# Patient Record
Sex: Male | Born: 1950 | Race: White | Hispanic: No | Marital: Married | Smoking: Never smoker
Health system: Southern US, Community
[De-identification: ages and names within clinical notes are randomized; demographics above are authoritative.]

## PROBLEM LIST (undated history)

## (undated) DIAGNOSIS — R059 Cough, unspecified: Secondary | ICD-10-CM

## (undated) DIAGNOSIS — R05 Cough: Secondary | ICD-10-CM

## (undated) DIAGNOSIS — I428 Other cardiomyopathies: Secondary | ICD-10-CM

## (undated) DIAGNOSIS — G4733 Obstructive sleep apnea (adult) (pediatric): Secondary | ICD-10-CM

## (undated) DIAGNOSIS — I4891 Unspecified atrial fibrillation: Secondary | ICD-10-CM

## (undated) HISTORY — PX: FOOT FRACTURE SURGERY: SHX645

## (undated) HISTORY — DX: Cough, unspecified: R05.9

## (undated) HISTORY — DX: Obstructive sleep apnea (adult) (pediatric): G47.33

## (undated) HISTORY — DX: Unspecified atrial fibrillation: I48.91

## (undated) HISTORY — PX: PILONIDAL CYST EXCISION: SHX744

## (undated) HISTORY — DX: Other cardiomyopathies: I42.8

## (undated) HISTORY — DX: Cough: R05

---

## 2004-10-18 ENCOUNTER — Emergency Department (HOSPITAL_COMMUNITY): Admission: EM | Admit: 2004-10-18 | Discharge: 2004-10-18 | Payer: Self-pay | Admitting: Emergency Medicine

## 2010-04-16 ENCOUNTER — Inpatient Hospital Stay (HOSPITAL_COMMUNITY)
Admission: EM | Admit: 2010-04-16 | Discharge: 2010-05-10 | Disposition: A | Discharge status: Rehabilitation Facility | Payer: Self-pay | Source: Home / Self Care | Attending: Pulmonary Disease | Admitting: Pulmonary Disease

## 2010-04-18 ENCOUNTER — Encounter (INDEPENDENT_AMBULATORY_CARE_PROVIDER_SITE_OTHER): Payer: Self-pay | Admitting: Pulmonary Disease

## 2010-04-23 LAB — URINALYSIS, ROUTINE W REFLEX MICROSCOPIC
Bilirubin Urine: NEGATIVE
Ketones, ur: NEGATIVE mg/dL
Leukocytes, UA: NEGATIVE
Nitrite: NEGATIVE
Protein, ur: NEGATIVE mg/dL
Specific Gravity, Urine: 1.01 (ref 1.005–1.030)
Urine Glucose, Fasting: NEGATIVE mg/dL
Urobilinogen, UA: 0.2 mg/dL (ref 0.0–1.0)
pH: 7 (ref 5.0–8.0)

## 2010-04-23 LAB — COMPREHENSIVE METABOLIC PANEL
ALT: 548 U/L — ABNORMAL HIGH (ref 0–53)
AST: 113 U/L — ABNORMAL HIGH (ref 0–37)
Albumin: 2.8 g/dL — ABNORMAL LOW (ref 3.5–5.2)
Alkaline Phosphatase: 91 U/L (ref 39–117)
BUN: 45 mg/dL — ABNORMAL HIGH (ref 6–23)
CO2: 30 mEq/L (ref 19–32)
Calcium: 8.9 mg/dL (ref 8.4–10.5)
Chloride: 100 mEq/L (ref 96–112)
Creatinine, Ser: 2.75 mg/dL — ABNORMAL HIGH (ref 0.4–1.5)
GFR calc Af Amer: 29 mL/min — ABNORMAL LOW (ref >60)
GFR calc non Af Amer: 24 mL/min — ABNORMAL LOW (ref >60)
Glucose, Bld: 164 mg/dL — ABNORMAL HIGH (ref 70–99)
Potassium: 3.4 mEq/L — ABNORMAL LOW (ref 3.5–5.1)
Sodium: 142 mEq/L (ref 135–145)
Total Bilirubin: 4.3 mg/dL — ABNORMAL HIGH (ref 0.3–1.2)
Total Protein: 6.4 g/dL (ref 6.0–8.3)

## 2010-04-23 LAB — URINE MICROSCOPIC-ADD ON

## 2010-04-23 LAB — BASIC METABOLIC PANEL
BUN: 52 mg/dL — ABNORMAL HIGH (ref 6–23)
CO2: 32 mEq/L (ref 19–32)
Calcium: 9.2 mg/dL (ref 8.4–10.5)
Chloride: 99 mEq/L (ref 96–112)
Creatinine, Ser: 3.03 mg/dL — ABNORMAL HIGH (ref 0.4–1.5)
GFR calc Af Amer: 26 mL/min — ABNORMAL LOW (ref >60)
GFR calc non Af Amer: 21 mL/min — ABNORMAL LOW (ref >60)
Glucose, Bld: 159 mg/dL — ABNORMAL HIGH (ref 70–99)
Potassium: 3.6 mEq/L (ref 3.5–5.1)
Sodium: 143 mEq/L (ref 135–145)

## 2010-04-23 LAB — MAGNESIUM: Magnesium: 2.5 mg/dL (ref 1.5–2.5)

## 2010-04-23 LAB — PHOSPHORUS: Phosphorus: 5.1 mg/dL — ABNORMAL HIGH (ref 2.3–4.6)

## 2010-04-24 LAB — COMPREHENSIVE METABOLIC PANEL
ALT: 381 U/L — ABNORMAL HIGH (ref 0–53)
AST: 77 U/L — ABNORMAL HIGH (ref 0–37)
Albumin: 3 g/dL — ABNORMAL LOW (ref 3.5–5.2)
Alkaline Phosphatase: 123 U/L — ABNORMAL HIGH (ref 39–117)
BUN: 65 mg/dL — ABNORMAL HIGH (ref 6–23)
CO2: 33 mEq/L — ABNORMAL HIGH (ref 19–32)
Calcium: 9.3 mg/dL (ref 8.4–10.5)
Chloride: 100 mEq/L (ref 96–112)
Creatinine, Ser: 3.51 mg/dL — ABNORMAL HIGH (ref 0.4–1.5)
GFR calc Af Amer: 22 mL/min — ABNORMAL LOW (ref >60)
GFR calc non Af Amer: 18 mL/min — ABNORMAL LOW (ref >60)
Glucose, Bld: 148 mg/dL — ABNORMAL HIGH (ref 70–99)
Potassium: 3.2 mEq/L — ABNORMAL LOW (ref 3.5–5.1)
Sodium: 146 mEq/L — ABNORMAL HIGH (ref 135–145)
Total Bilirubin: 3.7 mg/dL — ABNORMAL HIGH (ref 0.3–1.2)
Total Protein: 7.1 g/dL (ref 6.0–8.3)

## 2010-04-24 LAB — DIGOXIN LEVEL: Digitoxin Lvl: 0.6 ng/mL — ABNORMAL LOW (ref 0.8–2.0)

## 2010-04-24 LAB — MAGNESIUM: Magnesium: 2.5 mg/dL (ref 1.5–2.5)

## 2010-04-25 LAB — COMPREHENSIVE METABOLIC PANEL
ALT: 256 U/L — ABNORMAL HIGH (ref 0–53)
AST: 61 U/L — ABNORMAL HIGH (ref 0–37)
Albumin: 3 g/dL — ABNORMAL LOW (ref 3.5–5.2)
Alkaline Phosphatase: 150 U/L — ABNORMAL HIGH (ref 39–117)
BUN: 89 mg/dL — ABNORMAL HIGH (ref 6–23)
CO2: 30 mEq/L (ref 19–32)
Calcium: 9.1 mg/dL (ref 8.4–10.5)
Chloride: 104 mEq/L (ref 96–112)
Creatinine, Ser: 3.46 mg/dL — ABNORMAL HIGH (ref 0.4–1.5)
GFR calc Af Amer: 22 mL/min — ABNORMAL LOW (ref >60)
GFR calc non Af Amer: 18 mL/min — ABNORMAL LOW (ref >60)
Glucose, Bld: 151 mg/dL — ABNORMAL HIGH (ref 70–99)
Potassium: 3.4 mEq/L — ABNORMAL LOW (ref 3.5–5.1)
Sodium: 148 mEq/L — ABNORMAL HIGH (ref 135–145)
Total Bilirubin: 2.4 mg/dL — ABNORMAL HIGH (ref 0.3–1.2)
Total Protein: 6.8 g/dL (ref 6.0–8.3)

## 2010-04-25 LAB — PHOSPHORUS: Phosphorus: 5.8 mg/dL — ABNORMAL HIGH (ref 2.3–4.6)

## 2010-04-25 LAB — CBC
HCT: 42.5 % (ref 39.0–52.0)
Hemoglobin: 13.5 g/dL (ref 13.0–17.0)
MCH: 30.4 pg (ref 26.0–34.0)
MCHC: 31.8 g/dL (ref 30.0–36.0)
MCV: 95.7 fL (ref 78.0–100.0)
Platelets: 174 10*3/uL (ref 150–400)
RBC: 4.44 MIL/uL (ref 4.22–5.81)
RDW: 15.5 % (ref 11.5–15.5)
WBC: 11.5 10*3/uL — ABNORMAL HIGH (ref 4.0–10.5)

## 2010-04-25 LAB — APTT: aPTT: 30 seconds (ref 24–37)

## 2010-04-25 LAB — PROTIME-INR
INR: 1.27 (ref 0.00–1.49)
Prothrombin Time: 16.1 seconds — ABNORMAL HIGH (ref 11.6–15.2)

## 2010-05-05 LAB — COMPREHENSIVE METABOLIC PANEL
ALT: 226 U/L — ABNORMAL HIGH (ref 0–53)
ALT: 200 U/L — ABNORMAL HIGH (ref 0–53)
ALT: 126 U/L — ABNORMAL HIGH (ref 0–53)
ALT: 96 U/L — ABNORMAL HIGH (ref 0–53)
ALT: 72 U/L — ABNORMAL HIGH (ref 0–53)
AST: 107 U/L — ABNORMAL HIGH (ref 0–37)
AST: 107 U/L — ABNORMAL HIGH (ref 0–37)
AST: 51 U/L — ABNORMAL HIGH (ref 0–37)
AST: 39 U/L — ABNORMAL HIGH (ref 0–37)
AST: 42 U/L — ABNORMAL HIGH (ref 0–37)
Albumin: 2.8 g/dL — ABNORMAL LOW (ref 3.5–5.2)
Albumin: 2.6 g/dL — ABNORMAL LOW (ref 3.5–5.2)
Albumin: 2.6 g/dL — ABNORMAL LOW (ref 3.5–5.2)
Albumin: 2.7 g/dL — ABNORMAL LOW (ref 3.5–5.2)
Albumin: 2.5 g/dL — ABNORMAL LOW (ref 3.5–5.2)
Alkaline Phosphatase: 245 U/L — ABNORMAL HIGH (ref 39–117)
Alkaline Phosphatase: 264 U/L — ABNORMAL HIGH (ref 39–117)
Alkaline Phosphatase: 225 U/L — ABNORMAL HIGH (ref 39–117)
Alkaline Phosphatase: 199 U/L — ABNORMAL HIGH (ref 39–117)
Alkaline Phosphatase: 175 U/L — ABNORMAL HIGH (ref 39–117)
BUN: 86 mg/dL — ABNORMAL HIGH (ref 6–23)
BUN: 82 mg/dL — ABNORMAL HIGH (ref 6–23)
BUN: 87 mg/dL — ABNORMAL HIGH (ref 6–23)
BUN: 88 mg/dL — ABNORMAL HIGH (ref 6–23)
BUN: 55 mg/dL — ABNORMAL HIGH (ref 6–23)
CO2: 30 mEq/L (ref 19–32)
CO2: 30 mEq/L (ref 19–32)
CO2: 32 mEq/L (ref 19–32)
CO2: 33 mEq/L — ABNORMAL HIGH (ref 19–32)
CO2: 25 mEq/L (ref 19–32)
Calcium: 9.2 mg/dL (ref 8.4–10.5)
Calcium: 9 mg/dL (ref 8.4–10.5)
Calcium: 9 mg/dL (ref 8.4–10.5)
Calcium: 8.9 mg/dL (ref 8.4–10.5)
Calcium: 8.4 mg/dL (ref 8.4–10.5)
Chloride: 110 mEq/L (ref 96–112)
Chloride: 117 mEq/L — ABNORMAL HIGH (ref 96–112)
Chloride: 107 mEq/L (ref 96–112)
Chloride: 101 mEq/L (ref 96–112)
Chloride: 102 mEq/L (ref 96–112)
Creatinine, Ser: 3.08 mg/dL — ABNORMAL HIGH (ref 0.4–1.5)
Creatinine, Ser: 2.57 mg/dL — ABNORMAL HIGH (ref 0.4–1.5)
Creatinine, Ser: 2.53 mg/dL — ABNORMAL HIGH (ref 0.4–1.5)
Creatinine, Ser: 2.58 mg/dL — ABNORMAL HIGH (ref 0.4–1.5)
Creatinine, Ser: 2.64 mg/dL — ABNORMAL HIGH (ref 0.4–1.5)
GFR calc Af Amer: 25 mL/min — ABNORMAL LOW (ref >60)
GFR calc Af Amer: 31 mL/min — ABNORMAL LOW (ref >60)
GFR calc Af Amer: 32 mL/min — ABNORMAL LOW (ref >60)
GFR calc Af Amer: 31 mL/min — ABNORMAL LOW (ref >60)
GFR calc Af Amer: 30 mL/min — ABNORMAL LOW (ref >60)
GFR calc non Af Amer: 21 mL/min — ABNORMAL LOW (ref >60)
GFR calc non Af Amer: 26 mL/min — ABNORMAL LOW (ref >60)
GFR calc non Af Amer: 26 mL/min — ABNORMAL LOW (ref >60)
GFR calc non Af Amer: 26 mL/min — ABNORMAL LOW (ref >60)
GFR calc non Af Amer: 25 mL/min — ABNORMAL LOW (ref >60)
Glucose, Bld: 179 mg/dL — ABNORMAL HIGH (ref 70–99)
Glucose, Bld: 162 mg/dL — ABNORMAL HIGH (ref 70–99)
Glucose, Bld: 156 mg/dL — ABNORMAL HIGH (ref 70–99)
Glucose, Bld: 108 mg/dL — ABNORMAL HIGH (ref 70–99)
Glucose, Bld: 94 mg/dL (ref 70–99)
Potassium: 3.4 mEq/L — ABNORMAL LOW (ref 3.5–5.1)
Potassium: 3.2 mEq/L — ABNORMAL LOW (ref 3.5–5.1)
Potassium: 3.2 mEq/L — ABNORMAL LOW (ref 3.5–5.1)
Potassium: 3.6 mEq/L (ref 3.5–5.1)
Potassium: 3.7 mEq/L (ref 3.5–5.1)
Sodium: 152 mEq/L — ABNORMAL HIGH (ref 135–145)
Sodium: 156 mEq/L — ABNORMAL HIGH (ref 135–145)
Sodium: 152 mEq/L — ABNORMAL HIGH (ref 135–145)
Sodium: 148 mEq/L — ABNORMAL HIGH (ref 135–145)
Sodium: 136 mEq/L (ref 135–145)
Total Bilirubin: 2.5 mg/dL — ABNORMAL HIGH (ref 0.3–1.2)
Total Bilirubin: 2 mg/dL — ABNORMAL HIGH (ref 0.3–1.2)
Total Bilirubin: 1.6 mg/dL — ABNORMAL HIGH (ref 0.3–1.2)
Total Bilirubin: 1 mg/dL (ref 0.3–1.2)
Total Bilirubin: 1.1 mg/dL (ref 0.3–1.2)
Total Protein: 6.8 g/dL (ref 6.0–8.3)
Total Protein: 6.6 g/dL (ref 6.0–8.3)
Total Protein: 6.6 g/dL (ref 6.0–8.3)
Total Protein: 6.8 g/dL (ref 6.0–8.3)
Total Protein: 6.4 g/dL (ref 6.0–8.3)

## 2010-05-05 LAB — GLUCOSE, CAPILLARY
Glucose-Capillary: 157 mg/dL — ABNORMAL HIGH (ref 70–99)
Glucose-Capillary: 178 mg/dL — ABNORMAL HIGH (ref 70–99)
Glucose-Capillary: 190 mg/dL — ABNORMAL HIGH (ref 70–99)
Glucose-Capillary: 190 mg/dL — ABNORMAL HIGH (ref 70–99)
Glucose-Capillary: 110 mg/dL — ABNORMAL HIGH (ref 70–99)
Glucose-Capillary: 142 mg/dL — ABNORMAL HIGH (ref 70–99)
Glucose-Capillary: 165 mg/dL — ABNORMAL HIGH (ref 70–99)
Glucose-Capillary: 165 mg/dL — ABNORMAL HIGH (ref 70–99)
Glucose-Capillary: 179 mg/dL — ABNORMAL HIGH (ref 70–99)
Glucose-Capillary: 189 mg/dL — ABNORMAL HIGH (ref 70–99)
Glucose-Capillary: 118 mg/dL — ABNORMAL HIGH (ref 70–99)
Glucose-Capillary: 124 mg/dL — ABNORMAL HIGH (ref 70–99)
Glucose-Capillary: 135 mg/dL — ABNORMAL HIGH (ref 70–99)
Glucose-Capillary: 146 mg/dL — ABNORMAL HIGH (ref 70–99)
Glucose-Capillary: 161 mg/dL — ABNORMAL HIGH (ref 70–99)
Glucose-Capillary: 167 mg/dL — ABNORMAL HIGH (ref 70–99)
Glucose-Capillary: 168 mg/dL — ABNORMAL HIGH (ref 70–99)
Glucose-Capillary: 172 mg/dL — ABNORMAL HIGH (ref 70–99)
Glucose-Capillary: 182 mg/dL — ABNORMAL HIGH (ref 70–99)
Glucose-Capillary: 95 mg/dL (ref 70–99)
Glucose-Capillary: 96 mg/dL (ref 70–99)
Glucose-Capillary: 103 mg/dL — ABNORMAL HIGH (ref 70–99)
Glucose-Capillary: 105 mg/dL — ABNORMAL HIGH (ref 70–99)
Glucose-Capillary: 107 mg/dL — ABNORMAL HIGH (ref 70–99)
Glucose-Capillary: 110 mg/dL — ABNORMAL HIGH (ref 70–99)
Glucose-Capillary: 112 mg/dL — ABNORMAL HIGH (ref 70–99)
Glucose-Capillary: 113 mg/dL — ABNORMAL HIGH (ref 70–99)
Glucose-Capillary: 120 mg/dL — ABNORMAL HIGH (ref 70–99)
Glucose-Capillary: 120 mg/dL — ABNORMAL HIGH (ref 70–99)
Glucose-Capillary: 123 mg/dL — ABNORMAL HIGH (ref 70–99)
Glucose-Capillary: 170 mg/dL — ABNORMAL HIGH (ref 70–99)
Glucose-Capillary: 192 mg/dL — ABNORMAL HIGH (ref 70–99)
Glucose-Capillary: 98 mg/dL (ref 70–99)
Glucose-Capillary: 108 mg/dL — ABNORMAL HIGH (ref 70–99)
Glucose-Capillary: 117 mg/dL — ABNORMAL HIGH (ref 70–99)
Glucose-Capillary: 123 mg/dL — ABNORMAL HIGH (ref 70–99)
Glucose-Capillary: 127 mg/dL — ABNORMAL HIGH (ref 70–99)
Glucose-Capillary: 142 mg/dL — ABNORMAL HIGH (ref 70–99)
Glucose-Capillary: 143 mg/dL — ABNORMAL HIGH (ref 70–99)
Glucose-Capillary: 143 mg/dL — ABNORMAL HIGH (ref 70–99)
Glucose-Capillary: 179 mg/dL — ABNORMAL HIGH (ref 70–99)
Glucose-Capillary: 113 mg/dL — ABNORMAL HIGH (ref 70–99)
Glucose-Capillary: 114 mg/dL — ABNORMAL HIGH (ref 70–99)
Glucose-Capillary: 114 mg/dL — ABNORMAL HIGH (ref 70–99)
Glucose-Capillary: 122 mg/dL — ABNORMAL HIGH (ref 70–99)
Glucose-Capillary: 123 mg/dL — ABNORMAL HIGH (ref 70–99)
Glucose-Capillary: 143 mg/dL — ABNORMAL HIGH (ref 70–99)

## 2010-05-05 LAB — BASIC METABOLIC PANEL
BUN: 83 mg/dL — ABNORMAL HIGH (ref 6–23)
BUN: 86 mg/dL — ABNORMAL HIGH (ref 6–23)
BUN: 81 mg/dL — ABNORMAL HIGH (ref 6–23)
BUN: 67 mg/dL — ABNORMAL HIGH (ref 6–23)
BUN: 55 mg/dL — ABNORMAL HIGH (ref 6–23)
CO2: 28 mEq/L (ref 19–32)
CO2: 30 mEq/L (ref 19–32)
CO2: 30 mEq/L (ref 19–32)
CO2: 28 mEq/L (ref 19–32)
CO2: 26 mEq/L (ref 19–32)
Calcium: 8.7 mg/dL (ref 8.4–10.5)
Calcium: 8.9 mg/dL (ref 8.4–10.5)
Calcium: 8.6 mg/dL (ref 8.4–10.5)
Calcium: 8.6 mg/dL (ref 8.4–10.5)
Calcium: 8.5 mg/dL (ref 8.4–10.5)
Chloride: 119 mEq/L — ABNORMAL HIGH (ref 96–112)
Chloride: 115 mEq/L — ABNORMAL HIGH (ref 96–112)
Chloride: 98 mEq/L (ref 96–112)
Chloride: 99 mEq/L (ref 96–112)
Chloride: 99 mEq/L (ref 96–112)
Creatinine, Ser: 2.59 mg/dL — ABNORMAL HIGH (ref 0.4–1.5)
Creatinine, Ser: 2.6 mg/dL — ABNORMAL HIGH (ref 0.4–1.5)
Creatinine, Ser: 2.51 mg/dL — ABNORMAL HIGH (ref 0.4–1.5)
Creatinine, Ser: 2.59 mg/dL — ABNORMAL HIGH (ref 0.4–1.5)
Creatinine, Ser: 2.45 mg/dL — ABNORMAL HIGH (ref 0.4–1.5)
GFR calc Af Amer: 31 mL/min — ABNORMAL LOW (ref >60)
GFR calc Af Amer: 31 mL/min — ABNORMAL LOW (ref >60)
GFR calc Af Amer: 32 mL/min — ABNORMAL LOW (ref >60)
GFR calc Af Amer: 31 mL/min — ABNORMAL LOW (ref >60)
GFR calc Af Amer: 33 mL/min — ABNORMAL LOW (ref >60)
GFR calc non Af Amer: 26 mL/min — ABNORMAL LOW (ref >60)
GFR calc non Af Amer: 25 mL/min — ABNORMAL LOW (ref >60)
GFR calc non Af Amer: 26 mL/min — ABNORMAL LOW (ref >60)
GFR calc non Af Amer: 26 mL/min — ABNORMAL LOW (ref >60)
GFR calc non Af Amer: 27 mL/min — ABNORMAL LOW (ref >60)
Glucose, Bld: 157 mg/dL — ABNORMAL HIGH (ref 70–99)
Glucose, Bld: 161 mg/dL — ABNORMAL HIGH (ref 70–99)
Glucose, Bld: 98 mg/dL (ref 70–99)
Glucose, Bld: 124 mg/dL — ABNORMAL HIGH (ref 70–99)
Glucose, Bld: 106 mg/dL — ABNORMAL HIGH (ref 70–99)
Potassium: 3.6 mEq/L (ref 3.5–5.1)
Potassium: 3 mEq/L — ABNORMAL LOW (ref 3.5–5.1)
Potassium: 3.1 mEq/L — ABNORMAL LOW (ref 3.5–5.1)
Potassium: 4 mEq/L (ref 3.5–5.1)
Potassium: 3.5 mEq/L (ref 3.5–5.1)
Sodium: 155 mEq/L — ABNORMAL HIGH (ref 135–145)
Sodium: 155 mEq/L — ABNORMAL HIGH (ref 135–145)
Sodium: 139 mEq/L (ref 135–145)
Sodium: 139 mEq/L (ref 135–145)
Sodium: 137 mEq/L (ref 135–145)

## 2010-05-05 LAB — BRAIN NATRIURETIC PEPTIDE
Pro B Natriuretic peptide (BNP): 1822 pg/mL — ABNORMAL HIGH (ref 0.0–100.0)
Pro B Natriuretic peptide (BNP): 1611 pg/mL — ABNORMAL HIGH (ref 0.0–100.0)
Pro B Natriuretic peptide (BNP): 625 pg/mL — ABNORMAL HIGH (ref 0.0–100.0)
Pro B Natriuretic peptide (BNP): 874 pg/mL — ABNORMAL HIGH (ref 0.0–100.0)

## 2010-05-05 LAB — CARBOXYHEMOGLOBIN
Carboxyhemoglobin: 1.4 % (ref 0.5–1.5)
Carboxyhemoglobin: 1.7 % — ABNORMAL HIGH (ref 0.5–1.5)
Carboxyhemoglobin: 1.6 % — ABNORMAL HIGH (ref 0.5–1.5)
Carboxyhemoglobin: 1.6 % — ABNORMAL HIGH (ref 0.5–1.5)
Methemoglobin: 0.5 % (ref 0.0–1.5)
Methemoglobin: 0.6 % (ref 0.0–1.5)
Methemoglobin: 0.4 % (ref 0.0–1.5)
Methemoglobin: 0.6 % (ref 0.0–1.5)
O2 Saturation: 67.6 %
O2 Saturation: 56.5 %
O2 Saturation: 95 %
O2 Saturation: 63.6 %
Total hemoglobin: 12.8 g/dL — ABNORMAL LOW (ref 13.5–18.0)
Total hemoglobin: 12.5 g/dL — ABNORMAL LOW (ref 13.5–18.0)
Total hemoglobin: 12.6 g/dL — ABNORMAL LOW (ref 13.5–18.0)
Total hemoglobin: 12.2 g/dL — ABNORMAL LOW (ref 13.5–18.0)
Total oxygen content: 62.2 mL/dL — ABNORMAL HIGH (ref 15.0–23.0)

## 2010-05-05 LAB — POCT I-STAT 3, ART BLOOD GAS (G3+)
Acid-Base Excess: 9 mmol/L — ABNORMAL HIGH (ref 0.0–2.0)
Bicarbonate: 34 mEq/L — ABNORMAL HIGH (ref 20.0–24.0)
O2 Saturation: 99 %
Patient temperature: 98.7
TCO2: 35 mmol/L (ref 0–100)
pCO2 arterial: 46.8 mmHg — ABNORMAL HIGH (ref 35.0–45.0)
pH, Arterial: 7.469 — ABNORMAL HIGH (ref 7.350–7.450)
pO2, Arterial: 154 mmHg — ABNORMAL HIGH (ref 80.0–100.0)

## 2010-05-05 LAB — CBC
HCT: 41 % (ref 39.0–52.0)
HCT: 40.9 % (ref 39.0–52.0)
HCT: 39.9 % (ref 39.0–52.0)
HCT: 38.3 % — ABNORMAL LOW (ref 39.0–52.0)
HCT: 39 % (ref 39.0–52.0)
HCT: 37.5 % — ABNORMAL LOW (ref 39.0–52.0)
HCT: 35.3 % — ABNORMAL LOW (ref 39.0–52.0)
Hemoglobin: 13.2 g/dL (ref 13.0–17.0)
Hemoglobin: 12.6 g/dL — ABNORMAL LOW (ref 13.0–17.0)
Hemoglobin: 12.4 g/dL — ABNORMAL LOW (ref 13.0–17.0)
Hemoglobin: 11.9 g/dL — ABNORMAL LOW (ref 13.0–17.0)
Hemoglobin: 12.5 g/dL — ABNORMAL LOW (ref 13.0–17.0)
Hemoglobin: 12.3 g/dL — ABNORMAL LOW (ref 13.0–17.0)
Hemoglobin: 11.5 g/dL — ABNORMAL LOW (ref 13.0–17.0)
MCH: 30.8 pg (ref 26.0–34.0)
MCH: 30.1 pg (ref 26.0–34.0)
MCH: 30.5 pg (ref 26.0–34.0)
MCH: 30.1 pg (ref 26.0–34.0)
MCH: 30.3 pg (ref 26.0–34.0)
MCH: 30.1 pg (ref 26.0–34.0)
MCH: 29.8 pg (ref 26.0–34.0)
MCHC: 32.2 g/dL (ref 30.0–36.0)
MCHC: 30.8 g/dL (ref 30.0–36.0)
MCHC: 31.1 g/dL (ref 30.0–36.0)
MCHC: 31.1 g/dL (ref 30.0–36.0)
MCHC: 32.1 g/dL (ref 30.0–36.0)
MCHC: 32.8 g/dL (ref 30.0–36.0)
MCHC: 32.6 g/dL (ref 30.0–36.0)
MCV: 95.8 fL (ref 78.0–100.0)
MCV: 97.6 fL (ref 78.0–100.0)
MCV: 98 fL (ref 78.0–100.0)
MCV: 96.7 fL (ref 78.0–100.0)
MCV: 94.7 fL (ref 78.0–100.0)
MCV: 91.9 fL (ref 78.0–100.0)
MCV: 91.5 fL (ref 78.0–100.0)
Platelets: 230 10*3/uL (ref 150–400)
Platelets: 321 10*3/uL (ref 150–400)
Platelets: 379 10*3/uL (ref 150–400)
Platelets: 377 10*3/uL (ref 150–400)
Platelets: 424 10*3/uL — ABNORMAL HIGH (ref 150–400)
Platelets: 454 10*3/uL — ABNORMAL HIGH (ref 150–400)
Platelets: 600 10*3/uL — ABNORMAL HIGH (ref 150–400)
RBC: 4.28 MIL/uL (ref 4.22–5.81)
RBC: 4.19 MIL/uL — ABNORMAL LOW (ref 4.22–5.81)
RBC: 4.07 MIL/uL — ABNORMAL LOW (ref 4.22–5.81)
RBC: 3.96 MIL/uL — ABNORMAL LOW (ref 4.22–5.81)
RBC: 4.12 MIL/uL — ABNORMAL LOW (ref 4.22–5.81)
RBC: 4.08 MIL/uL — ABNORMAL LOW (ref 4.22–5.81)
RBC: 3.86 MIL/uL — ABNORMAL LOW (ref 4.22–5.81)
RDW: 15.7 % — ABNORMAL HIGH (ref 11.5–15.5)
RDW: 15.9 % — ABNORMAL HIGH (ref 11.5–15.5)
RDW: 15.4 % (ref 11.5–15.5)
RDW: 14.9 % (ref 11.5–15.5)
RDW: 14.6 % (ref 11.5–15.5)
RDW: 14.2 % (ref 11.5–15.5)
RDW: 14.7 % (ref 11.5–15.5)
WBC: 14.7 10*3/uL — ABNORMAL HIGH (ref 4.0–10.5)
WBC: 16.4 10*3/uL — ABNORMAL HIGH (ref 4.0–10.5)
WBC: 17.9 10*3/uL — ABNORMAL HIGH (ref 4.0–10.5)
WBC: 19.2 10*3/uL — ABNORMAL HIGH (ref 4.0–10.5)
WBC: 13.9 10*3/uL — ABNORMAL HIGH (ref 4.0–10.5)
WBC: 12.3 10*3/uL — ABNORMAL HIGH (ref 4.0–10.5)
WBC: 10.7 10*3/uL — ABNORMAL HIGH (ref 4.0–10.5)

## 2010-05-05 LAB — IRON AND TIBC
Iron: 33 ug/dL — ABNORMAL LOW (ref 42–135)
Saturation Ratios: 14 % — ABNORMAL LOW (ref 20–55)
TIBC: 240 ug/dL (ref 215–435)
UIBC: 207 ug/dL

## 2010-05-05 LAB — PROTIME-INR
INR: 1.33 (ref 0.00–1.49)
INR: 1.29 (ref 0.00–1.49)
INR: 1.44 (ref 0.00–1.49)
INR: 1.66 — ABNORMAL HIGH (ref 0.00–1.49)
INR: 2.31 — ABNORMAL HIGH (ref 0.00–1.49)
INR: 2.06 — ABNORMAL HIGH (ref 0.00–1.49)
Prothrombin Time: 16.7 seconds — ABNORMAL HIGH (ref 11.6–15.2)
Prothrombin Time: 16.3 seconds — ABNORMAL HIGH (ref 11.6–15.2)
Prothrombin Time: 17.7 seconds — ABNORMAL HIGH (ref 11.6–15.2)
Prothrombin Time: 19.8 seconds — ABNORMAL HIGH (ref 11.6–15.2)
Prothrombin Time: 25.5 seconds — ABNORMAL HIGH (ref 11.6–15.2)
Prothrombin Time: 23.4 seconds — ABNORMAL HIGH (ref 11.6–15.2)

## 2010-05-05 LAB — DIFFERENTIAL
Basophils Absolute: 0.2 10*3/uL — ABNORMAL HIGH (ref 0.0–0.1)
Basophils Relative: 1 % (ref 0–1)
Eosinophils Absolute: 0.2 10*3/uL (ref 0.0–0.7)
Eosinophils Relative: 1 % (ref 0–5)
Lymphocytes Relative: 7 % — ABNORMAL LOW (ref 12–46)
Lymphs Abs: 1.1 10*3/uL (ref 0.7–4.0)
Monocytes Absolute: 1.1 10*3/uL — ABNORMAL HIGH (ref 0.1–1.0)
Monocytes Relative: 7 % (ref 3–12)
Neutro Abs: 13.8 10*3/uL — ABNORMAL HIGH (ref 1.7–7.7)
Neutrophils Relative %: 84 % — ABNORMAL HIGH (ref 43–77)

## 2010-05-05 LAB — APTT: aPTT: 35 seconds (ref 24–37)

## 2010-05-05 LAB — CULTURE, BLOOD (ROUTINE X 2)
Culture  Setup Time: 201201052239
Culture  Setup Time: 201201052239
Culture: NO GROWTH
Culture: NO GROWTH

## 2010-05-05 LAB — PHOSPHORUS
Phosphorus: 4.6 mg/dL (ref 2.3–4.6)
Phosphorus: 5.2 mg/dL — ABNORMAL HIGH (ref 2.3–4.6)
Phosphorus: 3.9 mg/dL (ref 2.3–4.6)

## 2010-05-05 LAB — DIGOXIN LEVEL: Digoxin Level: <0.2 ng/mL — ABNORMAL LOW (ref 0.8–2.0)

## 2010-05-05 LAB — AMMONIA: Ammonia: 24 umol/L (ref 11–35)

## 2010-05-05 LAB — MRSA PCR SCREENING: MRSA by PCR: NEGATIVE

## 2010-05-05 LAB — MAGNESIUM
Magnesium: 2.5 mg/dL (ref 1.5–2.5)
Magnesium: 2.5 mg/dL (ref 1.5–2.5)
Magnesium: 2.5 mg/dL (ref 1.5–2.5)

## 2010-05-05 LAB — VITAMIN B12: Vitamin B-12: 1224 pg/mL — ABNORMAL HIGH (ref 211–911)

## 2010-05-05 LAB — VANCOMYCIN, TROUGH: Vancomycin Tr: 12.5 ug/mL (ref 10.0–20.0)

## 2010-05-05 LAB — LACTIC ACID, PLASMA: Lactic Acid, Venous: 1.9 mmol/L (ref 0.5–2.2)

## 2010-05-05 LAB — PROCALCITONIN: Procalcitonin: 0.9 ng/mL

## 2010-05-05 LAB — FOLATE: Folate: >20 ng/mL

## 2010-05-05 LAB — FERRITIN: Ferritin: 716 ng/mL — ABNORMAL HIGH (ref 22–322)

## 2010-05-05 LAB — RPR: RPR Ser Ql: NONREACTIVE

## 2010-05-07 LAB — BLOOD GAS, ARTERIAL
Acid-base deficit: 0.5 mmol/L (ref 0.0–2.0)
Bicarbonate: 22.2 mEq/L (ref 20.0–24.0)
Drawn by: 244801
FIO2: 0.21 %
O2 Saturation: 95.4 %
Patient temperature: 98.6
TCO2: 23.1 mmol/L (ref 0–100)
pCO2 arterial: 27.8 mmHg — ABNORMAL LOW (ref 35.0–45.0)
pH, Arterial: 7.514 — ABNORMAL HIGH (ref 7.350–7.450)
pO2, Arterial: 72.4 mmHg — ABNORMAL LOW (ref 80.0–100.0)

## 2010-05-07 LAB — PROTIME-INR
INR: 2.23 — ABNORMAL HIGH (ref 0.00–1.49)
INR: 2.11 — ABNORMAL HIGH (ref 0.00–1.49)
Prothrombin Time: 24.8 seconds — ABNORMAL HIGH (ref 11.6–15.2)
Prothrombin Time: 23.8 seconds — ABNORMAL HIGH (ref 11.6–15.2)

## 2010-05-07 LAB — GLUCOSE, CAPILLARY
Glucose-Capillary: 117 mg/dL — ABNORMAL HIGH (ref 70–99)
Glucose-Capillary: 130 mg/dL — ABNORMAL HIGH (ref 70–99)
Glucose-Capillary: 163 mg/dL — ABNORMAL HIGH (ref 70–99)
Glucose-Capillary: 130 mg/dL — ABNORMAL HIGH (ref 70–99)
Glucose-Capillary: 115 mg/dL — ABNORMAL HIGH (ref 70–99)

## 2010-05-07 LAB — BASIC METABOLIC PANEL
BUN: 50 mg/dL — ABNORMAL HIGH (ref 6–23)
CO2: 24 mEq/L (ref 19–32)
Calcium: 8.4 mg/dL (ref 8.4–10.5)
Chloride: 102 mEq/L (ref 96–112)
Creatinine, Ser: 2.29 mg/dL — ABNORMAL HIGH (ref 0.4–1.5)
GFR calc Af Amer: 36 mL/min — ABNORMAL LOW (ref >60)
GFR calc non Af Amer: 29 mL/min — ABNORMAL LOW (ref >60)
Glucose, Bld: 104 mg/dL — ABNORMAL HIGH (ref 70–99)
Potassium: 3.8 mEq/L (ref 3.5–5.1)
Sodium: 136 mEq/L (ref 135–145)

## 2010-05-07 LAB — CBC
HCT: 33.7 % — ABNORMAL LOW (ref 39.0–52.0)
Hemoglobin: 10.9 g/dL — ABNORMAL LOW (ref 13.0–17.0)
MCH: 29.6 pg (ref 26.0–34.0)
MCHC: 32.3 g/dL (ref 30.0–36.0)
MCV: 91.6 fL (ref 78.0–100.0)
Platelets: 697 10*3/uL — ABNORMAL HIGH (ref 150–400)
RBC: 3.68 MIL/uL — ABNORMAL LOW (ref 4.22–5.81)
RDW: 14.9 % (ref 11.5–15.5)
WBC: 8.6 10*3/uL (ref 4.0–10.5)

## 2010-05-07 LAB — CARBOXYHEMOGLOBIN
Carboxyhemoglobin: 1.3 % (ref 0.5–1.5)
Carboxyhemoglobin: 1.5 % (ref 0.5–1.5)
Methemoglobin: 0.3 % (ref 0.0–1.5)
Methemoglobin: 0.2 % (ref 0.0–1.5)
O2 Saturation: 99.7 %
O2 Saturation: 83.7 %
Total hemoglobin: 11.3 g/dL — ABNORMAL LOW (ref 13.5–18.0)
Total hemoglobin: 11.3 g/dL — ABNORMAL LOW (ref 13.5–18.0)

## 2010-05-08 ENCOUNTER — Encounter: Payer: Self-pay | Admitting: Internal Medicine

## 2010-05-08 NOTE — Discharge Summary (Signed)
NAME:  Ruben [name], Ruben [name] NO.:  [id number]  MEDICAL RECORD NO.:  [id number]          PATIENT TYPE:  INP  LOCATION:  3307                         FACILITY:  [facility]  PHYSICIAN:  Jeoffrey Massed, MD    DATE OF BIRTH:  Jun 11, 1950  DATE OF ADMISSION:  04/16/2010 DATE OF DISCHARGE:                        DISCHARGE SUMMARY - REFERRING   CONSULTANTS:   1. Dr. Molli Knock with Pulmonary Critical Care Medicine. 2. [name] with Cardiology. 3. Casimiro Needle with Nephrology. 4. Dr. Lesia Sago with Neurology. 5. Dr. Orvan Falconer with Infectious Disease services.  DATE OF DISCHARGE:  Pending.  DISCHARGE PHYSICIAN:  Pending.  PAST MEDICAL HISTORY:  None.  ADMITTING DIAGNOSES: 1. Atrial fibrillation with rapid ventricular response. 2. Probable right lower lobe pneumonia, atypical presentation. 3. Transaminitis with suspected acute hepatitis. 4. Acute renal failure. 5. Dehydration.  CHIEF COMPLAINT/REASON FOR ADMISSION:  Mr. Kleinman is a 60 year old male patient otherwise healthy who apparently had been treated 2 weeks prior  to admission for apparent bronchitis with Zithromax and Delsym.  He followed up with his primary care physician on the day of admission but since he appeared toxic, was sent to the ER for evaluation.  At that time he was found to have a mild leukocytosis and acute renal failure as well as new atrial fibrillation.  His chest x-ray was consistent with vascular congestion, incidental finding of hepatomegaly and also found a right lower lobe atelectasis with a small right effusion.  He was admitted by the [facility] team to the step-down unit and was started on aggressive IV fluid hydration at 150 mL an hour.  Because of his atrial fibrillation with rapid ventricular response, he was started on IV Cardizem, dose max up to 20 mg per hour. Upon  evaluation within the first 24 hours after admission, his heart rate was in the 80s but unfortunately  he was desaturating into the 86 to8 8% range on 6 liters of oxygen and was noted to have poor air exchange on clinical exam.  He underwent nebulizer therapy with albuterol and Atrovent with improved air exchange and improved oxygen saturation.  Unfortunately he remained tachypneic and pale with cool extremities.  His blood pressure ranged anywhere from 95 to 123 systolic.  He had been started on Avelox at presentation, but due to concern for possible evolving sepsis, this was changed to Zosyn and vancomycin.  Also within the first 24 hours, the patient had progression in renal failure with creatinine increasing.  He had developed a spontaneous coagulopathy with PT 23.9, INR 2.12, PTT 32 and his transaminitis had worsened.  His abdominal ultrasound showed no evidence of cholecystitis or cholelithiasis.  Due to concerns again of evolving sepsis and shock, Pulmonary Critical Care Medicine was asked to evaluate the patient.  Shortly after their evaluation they assumed care of the patient and he was transferred to the ICU setting.  Just prior to transfer to the ICU setting, PCCM / Dr. Molli Knock opted to place the patient on BiPAP therapy.  Unfortunately he decompensated from a respiratory standpoint and within several hours of being transferred to the ICU, he required intubation.  DIAGNOSTICS: 1. Two-view chest x-ray  December 28:  Cardiomegaly with vascular     congestion as well as right lower lobe atelectasis or pneumonia     with a small effusion. 2. Complete abdominal ultrasound December 29th shows normal appearing     gallbladder, hepatomegaly and a right pleural effusion. 3. Portable chest x-ray December 29th shows slightly improved right     lower lobe air space disease with grossly stable associated right     pleural effusion, no edema. 4. CT of the chest, abdomen and pelvis without contrast on December     29th showed prominent soft tissue edema and stranding in the right     axilla  with irregularity of the right subclavian vein, similar but     less prominent changes seen in the left axilla.  There was also     edema within the mediastinal and soft tissues and mild mediastinal     lymphadenopathy of indeterminate etiology.  There were bilateral     pleural effusions, right greater than left.  There was bibasilar     collapse with consolidation, right greater than left.  There was     also some edema or inflammation in the retroperitoneal tissues     around the distal aorta which tracks along both extraperitoneal     pelvic side walls and soft tissues of the pelvic floor.  Etiology     for this changes not evident on today's study.  Hepatomegaly was     also confirmed. 5. Portable chest x-ray December 29th shows interval placement of     right jugular vein catheter with interval development of minimal     left basilar atelectasis.  Poorly visualized right pleural fluid     and a stable cardiomegaly, as well as appropriate placement of     endotracheal tube. 6. Portable chest x-ray December 29th:  Stable cardiomegaly without     pulmonary edema and atelectasis involving the right upper lobe and  right lower lobe. 7. Portable chest x-ray December 30th:  New pulmonary edema with     associated small right pleural effusion, otherwise stable chest. 8. Portable chest x-ray December 30th:  This is post right-sided     thoracentesis.  No pneumothorax.  Decreased to resolved     interstitial edema.  Decreased bibasilar air space disease. 9. Portable chest x-ray December 31st showed a slight increase in     pulmonary edema pattern with persistent bibasilar atelectasis or     infiltrate and probable layering pleural effusions. 10.Portable chest x-ray January 1st:  No significant change in     congestive heart failure pattern and bibasilar atelectasis. 11.Portable chest x-ray January 2:  Cardiomegaly and slight increase     in air space disease, suggestive of congestive heart  failure. 12.Portable chest x-ray January 3:  Stable chest. 13.Portable chest x-ray January 4:  Possible improvement in vascular     congestion. 14.Portable chest x-ray January 5:  Stable bibasilar atelectasis and     air space disease suggestive of edema. 15.CT of the chest, abdomen and pelvis without contrast on January 5     shows improved nonspecific soft tissue stranding in the right     axilla.  Limited vascular assessment due to lack of intravenous     contrast is concerns for subclavian vein abnormality.  Doppler     ultrasound could be performed.  Slightly improved bilateral pleural     effusions with associated lower lobe air space disease.  No acute  abdominal and pelvic findings.  Improve retroperitoneal edema. 16.A CT of the head without contrast on January 5 shows no     intracranial hemorrhage or CT evidence of a large acute infarct. 17.Portable chest x-ray January 6 shows removal of right IJ central     venous catheter without pneumothorax. 18.Portable chest x-ray January 8 shows cardiomegaly and interstitial     edema. 19.Portable chest x-ray January 9 shows mild edema. 20.Portable chest x-ray January 10 shows cardiomegaly and pulmonary     vascular prominence most notable centrally. 21.Portable chest x-ray January 10:  She has tip of right arm PICC     line at cavoatrial junction.  Subsegmental atelectasis right middle     lobe.  Question minimal pulmonary edema. 22.Portable chest x-ray January 11 shows no pneumothorax, moderate     enlargement of cardiac silhouette, interval increased pulmonary     aeration, minimal atelectasis right medial base and left base. 23.CT of the head without contrast January 12 shows no acute finding,     stable compared to prior exam. 24.2-D echocardiogram December 29 shows mild dilatation of left     ventricle, normal wall thickness, ejection fraction 15%, diffuse     hypokinesis, trivial aortic regurgitation, mild to moderate  mitral     regurgitation, left and right atrium with moderate dilatation,     right ventricle was mildly dilated, systolic function in the right     ventricle was mildly to moderately reduced, pulmonary artery     pressure was only mildly elevated at 35 mmHg. 25.2-D echocardiogram January 6 shows that the left ventricle remains     mildly dilated with normal wall thickness, ejection fraction now     down to 10% with continued diffuse hypokinesis; left and right     atria are now noted to be mildly dilated.  There is mild mitral     regurgitation and trivial aortic valve regurgitation.  Now the     systolic pressure is moderately increased with pulmonary artery     pressure at 54 mmHg.  PROCEDURES:  Thoracentesis 04/18/2010 by Dr. Rory Percy.  LABORATORY:  Cardiac isoenzymes were cycled x3 at presentation with only mild abnormalities without elevation in troponin I, not consistent with ischemia.  Urinalysis showed small amount of bilirubin, 15 ketones, protein 30, trace of leukocytes, WBCs 0-2.  Subsequent urine culture from December 29 showed no growth.  Legionella antigen December 29th was negative.  TSH at admission was 1.99 with a total T4 of 9.4.  MRSA PCR screening was negative.  Negative lipid profile, cholesterol 151, triglycerides 74, HDL 30, LDL 106.  Lactic acid on December 29 was 8.3 with repeat lactic acid December 30th down to 2.7.  DIC panel on December 29, platelets 138,000.  PT 27.3, INR 2.52, PTT 35, fibrinogen 124, D-dimer greater than 20.  Pro calcitonin was 0.37 on December 29 and was repeated several times this admission with a peak of 1.36 on January 2.  Random urine creatinine was 301 on December 29 with a random urine sodium being less than 10 and potassium being 69.  Urinary strep antigen was negative.  C-reactive protein was 2.4.  Serum osmolality was 290.  ESR was 1.  Methanol level was negative.  Urine drug screen was negative .  LFTs at presentation  demonstrated total bilirubin of 2.5, alkaline phosphatase 69, AST 248, ALT 315 with peak readings of total bilirubin 5.2, alkaline phosphatase 68, AST 1812 and ALT 1672 on December 31 with  most recent readings on January 15, total bilirubin 1.1, alkaline phosphatase 175, AST 42, ALT 72.  Tylenol level at presentation was less than 10, aspirin level was less than 4.  An acute hepatitis panel was negative.  Serum LDH was elevated at 1020. Ceruloplasmin of blood was 34.  HIV nonreactive.  Random urine chloride 32.  Serum ferritin 1373.  Isopropyl alcohol was negative.  Acetone was negative.  Ethylene glycol was negative.  CMV antibody - IgG was positive and ANA was negative.  CMV antibody IgM was 0.12. Epstein-Barr virus VCA IgG was 1.90, the VCA IgM was 0.28, the NA IgG was 3.18 and the EA IgG was 0.91.  The total protein on the thoracentesis / pleural fluid was less than 3 with an LDH of 130 and amylase of 9.  The cell count with differential was yellow with a hazy appearance, 85 WBCs, 86 lymphocytes, 0 segs and 14 monocytes.  C3 complement 57.  C4 complement 11. Follow-up fibrinogen on December 31 was 125.  The follow-up LDH of 1899.  Co-ox December 31 was 81.1% with most recent co-ox January 17 being 83.7%.  Pleural fluid cultures showed no growth.  Quantitative bronchial aspirate lavage showed greater than 100,000 colonies of Candida.  Blood cultures from December 29 times two bottles showed no growth. Cytomegalovirus DNA ultraquantitative was less than 200. Leptospirosis titer was negative. C diff PCR was negative.  Ammonia level January 9 was 24.  BNP January 9 slightly elevated at 625.  F- Actin antibody IgG smooth muscle antibody was less than 20.  Repeat blood cultures January 5 times two bottles showed no growth.  Repeat MRSA PCR screening on January 11 is negative.  Anemia panel on January 12 showed an iron of 33, TIBC 240, percent sat 14, UIBC 207.  Vitamin B12 1224, folate  greater than 20, ferritin 716.  Most recent BNP on January 14 is 874.  RPR nonreactive.  Vitamin B1 Thiamine level 32.  ABG January 17 on room air:  pH 7.514, pCO2 27.8, pO2 72.4, bicarbonate 22.2, pCO2 23.1, base deficit 0.5 and oxygen saturation 95.4%.  Most recent CBC January 18:  WBC 8100, hemoglobin 10.4, hematocrit 32.5, platelets 688,000, neutrophils 66%.  Recent renal function panel January 18:  Sodium 135, potassium 3.8, chloride 102, CO2 24, glucose 104, BUN 46, creatinine 2.23.  PT 31.4 with an INR 3.03 on January 19.  HOSPITAL COURSE: 1. Sepsis with shock and associated ventilator-dependent respiratory     failure.  Pulmonary Critical Care Medicine assumed care of the     patient on April 16, 2010.  Shortly thereafter he was intubated.     He had previously been started on broad-spectrum antibiotics as     noted.  He was on Avelox, vancomycin and Zosyn.  Because of the     unclear etiology of his sepsis, Infectious Disease was consulted.     He underwent panculturing as well as serum evaluation for atypical     organisms and eventually no definitive source of sepsis was     identified.  The patient was unable to be weaned from the     ventilator rapidly.  This was complicated by underlying acute     kidney injury as well as cardiomyopathy and heart failure.  Despite     aggressive antibiotic therapy, the patient continued to have issues     with fevers.  At one point it was suspected that maybe his fevers     were a  drug reaction, so antibiotics were withdrawn.  ID     recommended to continue to observe but if fevers resumed or the     patient became more unstable to resume antibiotic therapy with     vancomycin and cefepime.  These were started on April 24, 2010 and     the vancomycin was discontinued on April 27, 2010 and cefepime was     discontinued on 01/09.  The patient had a last fever spike on     April 28, 2010 with a T-max of 100.9 and has remained afebrile      since that time.  Eventually pressor supportive agents were weaned     off as his multisystem organ failure improved.  Extubation was     delayed due to sequelae from dilated cardiomyopathy and heart     failure issues.  Cardiology recommended initiating support with IV     milrinone before extubating.  The patient eventually compensated     from a respiratory and cardiac standpoint enough to be extubated on     April 29, 2010 and has remained extubated since that time.     Currently he has no issues related to hypoxemic respiratory failure     and is maintaining saturations of 95-97% on room air and remains      hemodynamically stable. 2. 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     After admission to the ICU a planned echocardiogram with 2-D     echocardiogram was finally completed.  This had been initially     ordered because of presentation with atrial fibrillation.  An     unexpected finding of dilated cardiomyopathy with an EF of 15% was     discovered.  This prompted urgent Cardiology consultation with Dr.     Patty Sermons.  Per his note, the dilated cardiomyopathy  was of     uncertain etiology.  This could be due in part to and unrecognized     tachycardia-induced cardiomyopathy, from untreated or unrecognized     atrial fibrillation of unknown duration.  Other causes of dilated     cardiomyopathy should be considered including possible     myocarditis.  During the time of the initial evaluation, there was     no significant cardiac enzyme leak.  Subsequently cardiac     isoenzymes remained stable, as well as EKG with no signs of     ischemia noted.  ID also had been following along with [place] because     of the sepsis issues and after aggressive panculturing and workup,     it was felt that the etiology to the patient's cardiomyopathy was     more likely viral in etiology.  The patient has subsequently     undergone repeat echocardiogram on January 6 which  shows further     decrease in EF to 10%.  This is also associated with moderate     pulmonary hypertension with a reading of 54 mmHg.  Sequelae from     his cardiomyopathy this hospitalization mainly revolves around     heart failure which was exacerbated by acute kidney injury.  This     has been treated with medication management.  Prior to extubation     as previously mentioned, heart failure was treated with milrinone     which has now been weaned off.  The patient has also had other     arrhythmias this hospitalization.  As noted,  he presented with     atrial fibrillation RVR he has also had nonsustained ventricular     tachycardia.  This was treated with IV amiodarone, later     transitioned to p.o. and as of today, January 19, amiodarone dose     has been decreased to 200 mg daily.  At the present time,     Cardiology is focusing on medical management of his cardiomyopathy.     If he redevelops tachyarrhythmias, TEE and direct current     cardioversion will certainly be considered.  If medical management     fails, options include pursue LVAD therapy at another facility.  No     mention at this time regarding evaluation for heart     transplantation.  Medical therapy at this point for the dilated     cardiomyopathy consists of Coreg 6.25 mg p.o. b.i.d. as well as the     digoxin 0.125 mg p.o. daily.  He has no evidence of heart failure     and at the present time is not requiring diuretic therapy. 3. Acute kidney injury, now with stage III chronic kidney disease.     After admission the patient developed progressive renal failure,     felt initially due to sepsis.  Later when it was discovered the     patient had significantly reduced systolic function and associated     dilated cardiomyopathy, this was certainly considered as an     exacerbating factor.  Nevertheless Renal was consulted.  During the     critical phase of the patient's hospitalization he did require     CVVHD.   The patient's creatinine peaked in the 2.5 range this     hospitalization and actually decreased to as low as 1.5-1.7 during     hemodialysis.  As of January 18 his baseline creatinine is 2.23,     consistent with stage III chronic kidney disease.  It is suspected     that the patient's new chronic kidney disease is related to low     perfusion due to his cardiomyopathy.  Also the wife relates that     prior to admission the patient regularly took Aleve and other     NSAIDs, so certainly in the setting of low perfusion with these     medications kidney injury can occur. He currently is making urine. 4. Transaminitis.  The patient presented with transaminitis.     Diagnostic and laboratory workup were unrevealing for either toxic     etiology such as Tylenol overdose or alcohol exposure in the work     setting as etiology.  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  Abdominal ultrasound showed no evidence     of biliary disease.  Hepatitis panels were negative for an acute     infectious etiology.  It is felt that the patient's transaminitis     was most likely related to low perfusion and dehydration and     multisystem organ failure.  Liver enzymes have returned to     baseline. 5. Pulmonary hypertension.  As part of his treatment for dilated     cardiomyopathy and afterload reduction therapy, Apresoline was     initiated by Cardiology.  Attempts were made to increase the dosage     but he did not tolerate due to hypotension.  Currently he is     maintaining systolic blood pressures in the 110-120 range on  low-     dose hydralazine 12.5 mg q.8 h. 6. Atrial fibrillation and nonsustained ventricular tachycardia.  The     patient currently is maintaining sinus rhythm.  He has responded     well to the antiarrhythmic therapy.  As previously noted, he had     some issues with nonsustained ventricular tachycardia and runs of     atrial fibrillation with RVR.   Again, consideration for     cardioversion if needed for uncontrolled rate.  He has also been     started on Coumadin anticoagulation.  During the initial part of     the hospitalization this was delayed due to the patient's     thrombocytopenia, but with the resolution of thrombocytopenia and     evidence of DIC, Coumadin therapy was started.  INR today is     therapeutic. 7. Question of obstructive sleep apnea.  The patient was noted to have     excessive daytime sleepiness and was actually observed having     periods of apnea.  Some of this was felt to possibly be related to     side effects of medications since he had been ordered Percocet and     also recently had been started on Marinol for poor appetite and     possible associated nausea.  With the discontinuation of these     medications, the patient's mental status markedly improved,     although the daytime sleepiness continued.  Because of this,     Pulmonary Critical Care Medicine, Dr. Delton Coombes, was asked to evaluate     the patient.  In the interim, we also obtained an ABG which     demonstrated respiratory alkalosis of uncertain etiology.  Dr.     Delton Coombes documented that he would be concerned beginning empiric     noninvasive positive pressure ventilation given the alkalosis.     Therefore trending oximetry study was obtained and if meets     parameters, i.e., desats on room air with period of apnea, then     would place on AutoSet CPAP.  If the patient meets these     requirements and does require CPAP, he would need a formal     polysomnogram study as an outpatient to establish a diagnosis of     sleep apnea and to qualify for outpatient CPAP. 8. Toxic metabolic encephalopathy.  Throughout the hospitalization,     especially during the critical phase of the hospitalization, the     patient had altered mental status and significant agitation     requiring multiple medical therapy.  After he was extubated, he     continued to  have issues with confusion and alertness.  Neurology     was consulted.  EEG results were not revealing and showed no     evidence of seizure or other problems.  CT of the head showed no     acute infarct or any other problems.  Neurology felt that the     symptomatology was most likely multifactorial due to sepsis,     uremia, hypoxemia and drugs while acutely ill.  The patient     continues to improve from mental status standpoint.  He still seems     to be somewhat inappropriate although at times is difficult to     determine if this is related to an atypical sense of humor. 9. Profound deconditioning.  The patient has significant     deconditioning  and also due to the mental status issues is having a     few issues with responding to cues although he has markedly     improved in the past 48 hours.  Rehab is in the process of     evaluating the patient for admission.  He is considered an     excellent rehab candidate and should do well.  At the present time     we are awaiting confirmation from insurance and other payer sources     to approve his stay in the rehab setting.  DISCHARGE MEDICATIONS: 1. Amiodarone 200 mg p.o. b.i.d. 2. Coreg 6.25 mg b.i.d. 3. Digoxin 0.125 mg daily. 4. Apresoline 12.5 mg q.8 h. 5. Xopenex 6 puffs  b.i.d. 6. Glucerna supplement 237 mL t.i.d. with meals. 7. Coumadin dosing per pharmacy, current dose is 3 mg daily. 8. Tylenol 650 mg every 4 hours p.r.n. headache or pain. 9. Dulcolax suppository 2 mg per rectum daily p.r.n. constipation. 10.Xopenex 6 puffs inhaled q.6 h p.r.n. shortness of breath. 11.SCD hose for mechanical venothromboembolic prophylaxis. 12.Zofran 4 mg IV or p.o. q. 4-6 hours p.r.n. nausea.  DISPOSITION:  At the present time the patient is appropriate to transfer to the telemetry unit to continue monitoring regarding his history of atrial fibrillation and nonsustained ventricular tachycardia.  He is also on amiodarone.  We are also  awaiting Dr. Craige Cotta to give [place] the final interpretation of the pulse oximetry study which was completed over the past 24 hours to determine if the patient is a candidate for CPAP.  If he is a candidate for  CPAP since this is a new initiation, he will be unable to leave the step-down unit and will need to stay here.  There will be some issues once he qualifies for CIR because he is a new candidate for CPAP.  He does not have a CPAP machine and historically it has been difficult to obtain hospital related CPAP equipment and utilize that in the CIR setting.  We will need case management to assist with this.  Otherwise, we are waiting for CIR final approval and bed acceptance before hopefully transferring to CIR soon.     Allison L. Rennis Harding, N.P.   ______________________________ Jeoffrey Massed, MD    ALE/MEDQ  D:  05/08/2010  T:  05/08/2010  Job:  [id number]  Electronically Signed by Junious Silk N.P. on 05/08/2010 03:33:35 PM Electronically Signed by Jeoffrey Massed  on 05/08/2010 04:43:21 PM

## 2010-05-10 ENCOUNTER — Inpatient Hospital Stay (HOSPITAL_COMMUNITY)
Admission: RE | Admit: 2010-05-10 | Discharge: 2010-05-16 | Payer: Self-pay | Attending: Physical Medicine & Rehabilitation | Admitting: Physical Medicine & Rehabilitation

## 2010-05-12 LAB — RENAL FUNCTION PANEL
Albumin: 2.4 g/dL — ABNORMAL LOW (ref 3.5–5.2)
CO2: 24 mEq/L (ref 19–32)
Calcium: 8 mg/dL — ABNORMAL LOW (ref 8.4–10.5)
Chloride: 102 mEq/L (ref 96–112)
Chloride: 105 mEq/L (ref 96–112)
Creatinine, Ser: 2.23 mg/dL — ABNORMAL HIGH (ref 0.4–1.5)
Creatinine, Ser: 2.06 mg/dL — ABNORMAL HIGH (ref 0.4–1.5)
GFR calc Af Amer: 37 mL/min — ABNORMAL LOW (ref >60)
GFR calc Af Amer: 40 mL/min — ABNORMAL LOW (ref >60)
GFR calc non Af Amer: 30 mL/min — ABNORMAL LOW (ref >60)
GFR calc non Af Amer: 33 mL/min — ABNORMAL LOW (ref >60)
Phosphorus: 3.9 mg/dL (ref 2.3–4.6)

## 2010-05-12 LAB — CBC
HCT: 32.5 % — ABNORMAL LOW (ref 39.0–52.0)
HCT: 32.4 % — ABNORMAL LOW (ref 39.0–52.0)
Hemoglobin: 10.4 g/dL — ABNORMAL LOW (ref 13.0–17.0)
MCH: 29.3 pg (ref 26.0–34.0)
MCHC: 32 g/dL (ref 30.0–36.0)
MCV: 91.5 fL (ref 78.0–100.0)
Platelets: 688 10*3/uL — ABNORMAL HIGH (ref 150–400)
Platelets: 692 10*3/uL — ABNORMAL HIGH (ref 150–400)
RBC: 3.55 MIL/uL — ABNORMAL LOW (ref 4.22–5.81)
RDW: 14.9 % (ref 11.5–15.5)
RDW: 15.1 % (ref 11.5–15.5)
WBC: 8.1 10*3/uL (ref 4.0–10.5)
WBC: 8.6 10*3/uL (ref 4.0–10.5)

## 2010-05-12 LAB — PROTIME-INR
INR: 3.03 — ABNORMAL HIGH (ref 0.00–1.49)
INR: 4.05 — ABNORMAL HIGH (ref 0.00–1.49)
Prothrombin Time: 39.3 seconds — ABNORMAL HIGH (ref 11.6–15.2)

## 2010-05-12 LAB — DIFFERENTIAL
Basophils Absolute: 0.1 10*3/uL (ref 0.0–0.1)
Basophils Relative: 1 % (ref 0–1)
Eosinophils Absolute: 0.6 10*3/uL (ref 0.0–0.7)
Eosinophils Relative: 8 % — ABNORMAL HIGH (ref 0–5)
Lymphocytes Relative: 13 % (ref 12–46)
Lymphs Abs: 1.1 10*3/uL (ref 0.7–4.0)
Monocytes Absolute: 1 10*3/uL (ref 0.1–1.0)
Monocytes Relative: 13 % — ABNORMAL HIGH (ref 3–12)
Neutro Abs: 5.3 10*3/uL (ref 1.7–7.7)
Neutrophils Relative %: 66 % (ref 43–77)

## 2010-05-13 LAB — COMPREHENSIVE METABOLIC PANEL
ALT: 35 U/L (ref 0–53)
AST: 19 U/L (ref 0–37)
Albumin: 2.3 g/dL — ABNORMAL LOW (ref 3.5–5.2)
Alkaline Phosphatase: 108 U/L (ref 39–117)
CO2: 25 mEq/L (ref 19–32)
Chloride: 106 mEq/L (ref 96–112)
Creatinine, Ser: 1.65 mg/dL — ABNORMAL HIGH (ref 0.4–1.5)
GFR calc Af Amer: 52 mL/min — ABNORMAL LOW (ref >60)
GFR calc non Af Amer: 43 mL/min — ABNORMAL LOW (ref >60)
Potassium: 3.7 mEq/L (ref 3.5–5.1)
Total Bilirubin: 0.8 mg/dL (ref 0.3–1.2)

## 2010-05-13 LAB — CBC
HCT: 31.2 % — ABNORMAL LOW (ref 39.0–52.0)
Hemoglobin: 10.1 g/dL — ABNORMAL LOW (ref 13.0–17.0)
MCHC: 32.9 g/dL (ref 30.0–36.0)
MCV: 91.5 fL (ref 78.0–100.0)
RDW: 15 % (ref 11.5–15.5)
RDW: 15.2 % (ref 11.5–15.5)
WBC: 7 10*3/uL (ref 4.0–10.5)

## 2010-05-13 LAB — PROTIME-INR
INR: 3.08 — ABNORMAL HIGH (ref 0.00–1.49)
INR: 2.78 — ABNORMAL HIGH (ref 0.00–1.49)
INR: 2.93 — ABNORMAL HIGH (ref 0.00–1.49)
Prothrombin Time: 31.8 seconds — ABNORMAL HIGH (ref 11.6–15.2)

## 2010-05-13 LAB — DIFFERENTIAL
Eosinophils Relative: 12 % — ABNORMAL HIGH (ref 0–5)
Lymphocytes Relative: 16 % (ref 12–46)
Lymphs Abs: 1.1 10*3/uL (ref 0.7–4.0)
Monocytes Absolute: 0.8 10*3/uL (ref 0.1–1.0)
Neutro Abs: 4 10*3/uL (ref 1.7–7.7)

## 2010-05-13 LAB — DIGOXIN LEVEL: Digoxin Level: 0.3 ng/mL — ABNORMAL LOW (ref 0.8–2.0)

## 2010-05-13 LAB — BASIC METABOLIC PANEL
BUN: 20 mg/dL (ref 6–23)
Creatinine, Ser: 1.9 mg/dL — ABNORMAL HIGH (ref 0.4–1.5)
GFR calc non Af Amer: 36 mL/min — ABNORMAL LOW (ref >60)
Glucose, Bld: 115 mg/dL — ABNORMAL HIGH (ref 70–99)

## 2010-05-14 LAB — PROTIME-INR: Prothrombin Time: 34.4 seconds — ABNORMAL HIGH (ref 11.6–15.2)

## 2010-05-15 LAB — CLOSTRIDIUM DIFFICILE BY PCR: Toxigenic C. Difficile by PCR: NEGATIVE

## 2010-05-15 LAB — PROTIME-INR: INR: 3.47 — ABNORMAL HIGH (ref 0.00–1.49)

## 2010-05-16 LAB — PROTIME-INR
INR: 2.8 — ABNORMAL HIGH (ref 0.00–1.49)
Prothrombin Time: 29.6 seconds — ABNORMAL HIGH (ref 11.6–15.2)

## 2010-05-16 NOTE — Consult Note (Addendum)
NAME:  Ruben [name], Ruben [name] NO.:  [id number]  MEDICAL RECORD NO.:  [id number]          PATIENT TYPE:  INP  LOCATION:  3307                         FACILITY:  [facility]  PHYSICIAN:  Marlan Palau, M.D.  DATE OF BIRTH:  [birth year]  DATE OF CONSULTATION:  05/01/2010 DATE OF DISCHARGE:                                CONSULTATION   HISTORY OF PRESENT ILLNESS:  Ruben [name] is a 60 year old right- handed white male born 1951-02-03, without significant past medical history.  This patient was admitted to [facility] on April 16, 2010, with a several-day history of cough, increased shortness of breath.  This patient was found to have new-onset atrial fibrillation with a cardiomyopathy, ejection fraction around 15%.  This patient was also noted to have acute liver dysfunction and acute renal failure.  This patient did require intubation and has been ventilator dependent until 2 days ago.  The patient was extubated on April 29, 2010, and has been slightly confused since that time.  The patient has had some short-term memory issues that seem to wax and wane a little bit but has better recall of past events.  The patient has no focal numbness or weakness on the face, arms or legs and has taken a few short steps in the room.  The patient denies any visual field changes.  A CT scan of the brain done on April 24, 2010, shows no acute changes.  CT scan of the head has been reordered but has not yet been done.  Neurology was asked to see the patient for an evaluation of the confusion.  The patient continues to have elevated BUN, creatinine and liver enzymes.  PAST MEDICAL HISTORY:  Significant for; 1. New-onset mild confusion associated with severe intercurrent     illness, prolonged intubation. 2. Prolonged ventilator-dependent respiratory failure. 3. Atrial fibrillation. 4. Cardiomegaly, possibly viral with ejection fraction 15%. 5. Increased liver  enzymes. 6. Acute renal failure, metabolic acidosis. 7. Right foot fracture in the past. 8. Right wrist fracture in the past. 9. Possible right lower lobe pneumonia this admission.  ALLERGIES:  The patient has no known allergies.  SOCIAL HISTORY:  He does not smoke.  Drinks alcohol on occasion.  MEDICATIONS:  The patient was taking acetaminophen prior to admission but otherwise on no medications.  Current medications in the hospital include; 1. Coreg 6.25 mg twice daily. 2. Digoxin 0.125 mg daily. 3. Apresoline 25 mg every 8 hours. 4. Sliding scale insulin. 5. Lantus insulin 10 units daily. 6. Protonix 40 mg daily. 7. Potassium supplementation. 8. Coumadin therapy. 9. Amiodarone 450 mg if needed. 10.Zofran if needed.  SOCIAL HISTORY:  This patient is married and lives in the [place] area.  The patient works as an Audiological scientist. The patient has 2 children who are alive and well.  FAMILY MEDICAL HISTORY:  Notable that mother died with cancer and had Alzheimer disease.  Father died with prostate cancer.  The patient has 2 sisters, 1 brother, 1 sister died with breast cancer and 1 brother died with Wolff-Parkinson-White syndrome.  The other sister is in good health.  REVIEW  OF SYSTEMS:  Notable for some occasional low grade fevers.  The patient is sleeping well but is not too drowsy during the day.  The patient denies headaches.  Does note some shortness of breath.  Denies chest pains, abdominal pain, nausea or vomiting.  Denies focal numbness or weakness on the face, arms or legs.  The patient does not have any blackout episodes.  PHYSICAL EXAMINATION:  VITAL SIGNS:  Blood pressure currently is 125/62, heart rate 99, temperature afebrile, respiratory rate 22. GENERAL:  This patient is a fairly well-developed white male who is alert and cooperative at the time of the examination. HEENT:  Head is atraumatic.  Eyes; pupils are round and reactive  to light.  Extraocular movements are full.  Visual fields are full.  Speech is normal.  No aphasia or dysarthria. RESPIRATORY:  Relatively clear. CARDIOVASCULAR:  Distant heart sounds.  No obvious murmurs or rubs noted.  Occasionally irregular heart rhythm noted.  EXTREMITIES: Without significant edema. NEUROLOGIC:  Cranial nerves as above.  Again facial symmetry is present. The patient has good pinprick sensation on the face bilaterally.  Good strength of facial muscle, muscle of the head turn and shoulder shrug bilaterally.  Motor testing reveals a relatively good strength in all 4s, possibly some minimal proximal weakness in both legs and some slight weakness with elevating the right arm as compared to the left.  The patient has good symmetric pinprick sensation throughout.  No definite stocking-glove pinprick sensory noted.  The patient does have some slight impairment of vibratory sensation in the toes, however, normal on the arms.  The patient has fair finger-nose-finger and has some difficulty performing toe-to-finger bilaterally.  Gait was not tested. Deep tendon reflexes are symmetric.  Toes neutral bilaterally.  The patient had no obvious drift, maybe some slight asterixis on the arms with arms held up.  LABORATORY VALUES:  Notable for a white count of 12.3, hemoglobin of 12.3, hematocrit of 37.5, MCV of 91.9, platelets of 454.  Sodium 139, potassium 3.1, chloride of 98, CO2 of 30, glucose of 98, BUN of 81, creatinine of 2.51, calcium 8.6, phos of 3.9, magnesium 2.5.  An ammonia level from April 28, 2010, was 24.  Liver enzymes from April 30, 2010, indicate alk phosphatase of 199, SGOT of 39, SGPT of 96, albumin level of 2.7, total protein 6.8.  CK is 88.  A prior TSH was 1.99.  CT of the head is as above.  IMPRESSION: 1. Mild confusion/toxic metabolic encephalopathy. 2. Cardiomyopathy, ejection fraction 15%. 3. Acute renal failure. 4. Hepatic dysfunction.  This  patient has had significant underlying medical issues with multiorgan dysfunction.  The patient has had new-onset cardiomyopathy, atrial fibrillation, acute renal failure, hepatic dysfunction.  The patient has been ventilator dependent for almost 2 weeks and currently has recently been extubated.  Mild confusion would likely be expected given the clinical course of this patient.  The patient is at risk for embolic strokes however and a workup then to include a CAT scan is reasonable.  The patient want to go an EEG study and further blood work. I will follow the patient's clinic course while in-house and look for clinical improvement gradually over time.     Marlan Palau, M.D.     CKW/MEDQ  D:  05/01/2010  T:  05/02/2010  Job:  [id number]  cc:   [facility]  Electronically Signed by Thana Farr M.D. on 05/16/2010 09:23:35 AM

## 2010-05-20 ENCOUNTER — Ambulatory Visit: Payer: Self-pay | Admitting: Cardiology

## 2010-05-23 ENCOUNTER — Ambulatory Visit
Admission: RE | Admit: 2010-05-23 | Discharge: 2010-05-23 | Disposition: A | Discharge status: Home / Self Care | Payer: BC Managed Care – PPO | Source: Ambulatory Visit | Attending: Cardiology | Admitting: Cardiology

## 2010-05-23 ENCOUNTER — Ambulatory Visit (INDEPENDENT_AMBULATORY_CARE_PROVIDER_SITE_OTHER): Payer: BC Managed Care – PPO | Admitting: Cardiology

## 2010-05-23 ENCOUNTER — Ambulatory Visit: Payer: BC Managed Care – PPO | Attending: Neurology | Admitting: Physical Therapy

## 2010-05-23 ENCOUNTER — Other Ambulatory Visit: Payer: Self-pay | Admitting: Cardiology

## 2010-05-23 DIAGNOSIS — I4891 Unspecified atrial fibrillation: Secondary | ICD-10-CM

## 2010-05-23 DIAGNOSIS — R0602 Shortness of breath: Secondary | ICD-10-CM

## 2010-05-23 DIAGNOSIS — R269 Unspecified abnormalities of gait and mobility: Secondary | ICD-10-CM | POA: Insufficient documentation

## 2010-05-23 DIAGNOSIS — M6281 Muscle weakness (generalized): Secondary | ICD-10-CM | POA: Insufficient documentation

## 2010-05-23 DIAGNOSIS — IMO0001 Reserved for inherently not codable concepts without codable children: Secondary | ICD-10-CM | POA: Insufficient documentation

## 2010-05-26 ENCOUNTER — Ambulatory Visit: Payer: BC Managed Care – PPO | Admitting: Physical Therapy

## 2010-05-30 ENCOUNTER — Other Ambulatory Visit (INDEPENDENT_AMBULATORY_CARE_PROVIDER_SITE_OTHER): Payer: BC Managed Care – PPO

## 2010-05-30 ENCOUNTER — Ambulatory Visit: Payer: BC Managed Care – PPO | Admitting: Physical Therapy

## 2010-05-30 DIAGNOSIS — Z7901 Long term (current) use of anticoagulants: Secondary | ICD-10-CM

## 2010-05-30 DIAGNOSIS — I4891 Unspecified atrial fibrillation: Secondary | ICD-10-CM

## 2010-06-02 ENCOUNTER — Ambulatory Visit: Payer: BC Managed Care – PPO | Admitting: Physical Therapy

## 2010-06-03 ENCOUNTER — Ambulatory Visit: Payer: Self-pay | Admitting: Cardiology

## 2010-06-03 NOTE — Discharge Summary (Signed)
NAME:  RAESHAWN, VO NO.:  [id number]  MEDICAL RECORD NO.:  [id number]          PATIENT TYPE:  IPS  LOCATION:  4030                         FACILITY:  [facility]  PHYSICIAN:  Ranelle Oyster, M.D.DATE OF BIRTH:  1950-05-24  DATE OF ADMISSION:  05/10/2010 DATE OF DISCHARGE:  05/16/2010                              DISCHARGE SUMMARY   DISCHARGE DIAGNOSES: 1. Deconditioning with metabolic encephalopathy after septic shock. 2. Atrial fibrillation with rapid ventricular rate and viral dilated     cardiomyopathy. 3. Acute renal failure, resolved. 4. Hypertension. 5. Questionable obstructive sleep apnea.  This is a 60 year old white male with unremarkable past medical history, admitted on April 16, 2010, with upper respiratory infection x2 weeks, recently placed on azithromycin.  Noted progressive shortness of breath with fatigue.  BUN 52, creatinine 1.67.  Chest x-ray with right lower lobe atelectasis, small right effusion.  Placed on intravenous fluids.  CT of the chest negative for pulmonary emboli.  EKG with atrial fibrillation, placed on intravenous Cardizem.  Troponin mildly elevated, but not felt to be consistent with ischemia.  Urine drug screen negative.  Critical Care Medicine consulted for septic shock, placed on broad-spectrum antibiotics.  He was intubated secondary to decrease in oxygen saturations, underwent pan-culturing with no definite source of sepsis identified.  Echocardiogram on April 17, 2010, showed ejection fraction 10% with unexpected findings of viral dilated cardiomyopathy. Followup echocardiogram x2 with little changes.  Follow up with Dr. Patty Sermons, Cardiology Services with medical management.  There was even talks of possible need for left ventricular assistive device and even heart transplant.  Renal Service followup for elevated creatinine, Dr. Lowell Guitar.  Creatinine peaked at 2.5, felt to be acute renal failure secondary to low  perfusion due to his cardiomyopathy, creatinine stabilized at 2.06.  Cardiac status remained stabilized.  Coumadin had been added for atrial fibrillation.  There was some questionable possible obstructive sleep apnea.  Arterial blood gases with pO2 of 72.4.  Moments of apnea noted during sleep.  Plan was for outpatient followup with pulmonary services.  Mental status continued to wax and wane.  Cranial CT scan negative for acute changes.  Followup Neurology Service, Dr. Anne Hahn.  Suspect metabolic encephalopathy.  EEG was negative.  The patient was minimal assist for ambulation with some decreasing safety awareness.  He was admitted for comprehensive rehab program.  PAST MEDICAL HISTORY:  See discharge diagnoses.  No tobacco.  Occasional alcohol.  ALLERGIES:  None.  SOCIAL HISTORY:  Married.  He works as an Audiological scientist.  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  They have 2 children, but not in the area.  Two-level home, bedroom downstairs, 9 steps to entry.  FUNCTIONAL HISTORY PRIOR TO ADMISSION:  Independent.  FUNCTIONAL STATUS UPON ADMISSION TO REHAB SERVICES:  Supervision bed mobility, minimal assist transfers, ambulating 150 feet with rolling walker, minimum to moderate assist activities of daily living.  MEDICATIONS PRIOR TO ADMISSION:  Aleve as needed.  PHYSICAL EXAMINATION:  VITAL SIGNS:  Blood pressure 122/75, pulse 87, temperature 96.9, and respirations 18. GENERAL:  This was an alert male, in no acute distress.  He names person, place, date of birth,  needs some cues for recall of hospital events.  Deep tendon reflexes 2+.  Sensation intact to light touch. LUNGS:  Clear to auscultation. CARDIAC:  Rate irregularly irregular. ABDOMEN:  Soft and nontender.  Good bowel sounds.  REHABILITATION HOSPITAL COURSE:  The patient was admitted to Inpatient Rehab Services with therapies initiated on a 3-hour daily basis consisting of physical therapy, occupational therapy, speech  therapy, and rehabilitation nursing.  The following issues were addressed during the patient's rehabilitation stay.  Pertaining to Mr. Belay deconditioning, metabolic encephalopathy after septic shock, remained stable.  He continued to progress in all areas of his therapy.  He remained on chronic Coumadin for atrial fibrillation, followed by Dr. Patty Sermons.  There have even been talks of possible need for a left ventricular assistive device versus heart transplant.  However, he continued to do well.  His blood pressures were well controlled on amiodarone, Coreg, Lanoxin, and hydralazine.  His latest INR was 3.41. No bleeding episodes.  Early on hospital course of acute renal failure, felt to be suspect to low perfusion secondary to cardiomyopathy.  Latest creatinine of 1.65.  There was some question of possible obstructive sleep apnea early on this course after being extubated.  He would follow up outpatient wise with Pulmonary Services for questionable need for CPAP.  The patient received weekly collaborative interdisciplinary team conferences to discuss estimated length of stay, family teaching, and any barriers to discharge.  He was supervision overall for activities of daily living and minimal assist mobility.  He scored a 47/56 on Berg testing.  Supervision overall for his safety.  He is able to ambulate greater than 1000 feet including in the community.  He would use a straight-point cane at times for balance.  He performed path finding with cues to the parking garage.  He was able to walk up and down hills, curbs, and steps in the community with a rail and cane.  He was modified independent for navigation for internet and desired web sites.  The patient with increased time for typing and misspelling, but the patient reports that he is baseline for that.  The patient with insight and some anticipatory awareness.  Overall, the patient made excellent progress. Full family  teaching was completed.  He was advised no drinking of alcohol, smoking, or driving.  He will be discharged to home.  DISCHARGE MEDICATIONS AT TIME OF DICTATION: 1. Coumadin with latest dose of 1 mg, however, this was adjusted     accordingly for a goal INR of 2.0-3.0. 2. Amiodarone 200 mg twice daily. 3. Coreg 6.25 mg twice daily. 4. Lanoxin 0.125 mg every other day. 5. Hydralazine 12.5 mg every 8 hours. 6. Xopenex 6 puffs twice daily, 45 mcg inhaler. 7. Tylenol as needed.  DIET:  Low sodium, low fat.  SPECIAL INSTRUCTIONS:  The patient would follow up with Dr. Patty Sermons in regards to monitoring of his Coumadin therapy as well as hospital appointment in relation to his viral cardiomyopathy.  He would follow up with Dr. Faith Rogue at the Outpatient Rehab Service office as advised. Ongoing therapies as dictated as per [employer].  He would follow up with Pulmonary Services in relation for ongoing testing for possible obstructive sleep apnea and questionable need for CPAP.     Mariam Dollar, P.A.   ______________________________ Ranelle Oyster, M.D.    DA/MEDQ  D:  05/15/2010  T:  05/15/2010  Job:  [id number]  cc:   Tracey Harries, M.D. [name], M.D. Mindi Slicker. Lowell Guitar,  M.D.C. Lesia Sago, M.D. Coralyn Helling, MD  Electronically Signed by Mariam Dollar P.A. on 05/16/2010 06:49:29 AM Electronically Signed by Faith Rogue M.D. on 06/03/2010 04:31:07 PM

## 2010-06-04 ENCOUNTER — Ambulatory Visit (INDEPENDENT_AMBULATORY_CARE_PROVIDER_SITE_OTHER): Payer: BC Managed Care – PPO | Admitting: Cardiology

## 2010-06-04 DIAGNOSIS — I4891 Unspecified atrial fibrillation: Secondary | ICD-10-CM

## 2010-06-04 DIAGNOSIS — Z7901 Long term (current) use of anticoagulants: Secondary | ICD-10-CM

## 2010-06-04 DIAGNOSIS — R0602 Shortness of breath: Secondary | ICD-10-CM

## 2010-06-05 ENCOUNTER — Encounter: Payer: Self-pay | Admitting: Pulmonary Disease

## 2010-06-05 ENCOUNTER — Inpatient Hospital Stay (INDEPENDENT_AMBULATORY_CARE_PROVIDER_SITE_OTHER): Payer: BC Managed Care – PPO | Admitting: Pulmonary Disease

## 2010-06-05 DIAGNOSIS — R05 Cough: Secondary | ICD-10-CM | POA: Insufficient documentation

## 2010-06-05 DIAGNOSIS — R0902 Hypoxemia: Secondary | ICD-10-CM | POA: Insufficient documentation

## 2010-06-05 DIAGNOSIS — R059 Cough, unspecified: Secondary | ICD-10-CM

## 2010-06-05 DIAGNOSIS — I4891 Unspecified atrial fibrillation: Secondary | ICD-10-CM | POA: Insufficient documentation

## 2010-06-05 DIAGNOSIS — I428 Other cardiomyopathies: Secondary | ICD-10-CM

## 2010-06-06 ENCOUNTER — Ambulatory Visit: Payer: BC Managed Care – PPO | Admitting: Physical Therapy

## 2010-06-09 ENCOUNTER — Ambulatory Visit: Payer: BC Managed Care – PPO | Admitting: Physical Therapy

## 2010-06-10 ENCOUNTER — Encounter: Payer: Self-pay | Admitting: Cardiovascular Disease

## 2010-06-10 ENCOUNTER — Encounter: Payer: Self-pay | Admitting: Cardiology

## 2010-06-10 ENCOUNTER — Ambulatory Visit (HOSPITAL_COMMUNITY): Payer: BC Managed Care – PPO | Attending: Cardiology

## 2010-06-10 DIAGNOSIS — I428 Other cardiomyopathies: Secondary | ICD-10-CM

## 2010-06-10 DIAGNOSIS — I4891 Unspecified atrial fibrillation: Secondary | ICD-10-CM | POA: Insufficient documentation

## 2010-06-10 DIAGNOSIS — I379 Nonrheumatic pulmonary valve disorder, unspecified: Secondary | ICD-10-CM | POA: Insufficient documentation

## 2010-06-10 DIAGNOSIS — I079 Rheumatic tricuspid valve disease, unspecified: Secondary | ICD-10-CM | POA: Insufficient documentation

## 2010-06-10 DIAGNOSIS — I080 Rheumatic disorders of both mitral and aortic valves: Secondary | ICD-10-CM | POA: Insufficient documentation

## 2010-06-11 ENCOUNTER — Encounter (INDEPENDENT_AMBULATORY_CARE_PROVIDER_SITE_OTHER): Payer: BC Managed Care – PPO

## 2010-06-11 DIAGNOSIS — I4891 Unspecified atrial fibrillation: Secondary | ICD-10-CM

## 2010-06-11 DIAGNOSIS — Z7901 Long term (current) use of anticoagulants: Secondary | ICD-10-CM

## 2010-06-11 NOTE — Assessment & Plan Note (Signed)
Summary: Ruben [name]   Copy to:  [name] Primary Provider/Referring Provider:  Dr. Barry Brunner [place]  CC:  Hospital follow up, sob with exertion, and prod cough green past 2 days.  History of Present Illness: 60 yo male for hospital follow up of hypoxemia and possible sleep apnea.  He has slowly improved since hospital discharge.  His strength is improving, and he is getting around w/o a cane.    His breathing is better.  He gets occasional cough with sputum.  He has been using his inhaler, and this seems to help.  He denies any prior history of smoking, allergies, or asthma.  He is not having chest pain or palpitations.  His leg swelling has improved.    He is sleeping okay, and does not feel like he has any sleep problems.   CXR  Procedure date:  05/23/2010  Findings:       CHEST - 2 VIEW    Comparison: Portable chest x-ray of 04/30/2010    Findings: The lungs are clear.  No effusion is seen.  There is   cardiomegaly present.  There may be minimal pulmonary vascular   congestion present.  No bony abnormality is seen.    IMPRESSION:   Cardiomegaly.  Question minimal pulmonary vascular congestion.  Echocardiogram  Procedure date:  05/08/2010  Findings:        Study Conclusions    - Left ventricle: The cavity size was moderately dilated. Wall     thickness was normal. Systolic function was severely reduced. The     estimated ejection fraction was 25%. Global hypokinesis. Diastolic     function was not assessed.   - Aortic valve: There was no stenosis.   - Mitral valve: Mild regurgitation.   - Left atrium: The atrium was mildly to moderately dilated.   - Right ventricle: Systolic function was mildly reduced.   - Right atrium: The atrium was mildly dilated.   - Pulmonary arteries: No TR doppler jet obtained so unable to     estimate PA systolic pressure.   - Systemic veins: IVC measured 2.5 cm with minimal respirophasic     variation. Estimated RA pressure  16-20 mmHg.   Impressions:    - Limited echo for LV EF. Moderately dilated LV with severe systolic     dysfunction, EF 25% with global hypokinesis. Mild mitral     regurgitation. The RV appeared normal in size with possibly mild     systolic dysfunction.   Preventive Screening-Counseling & Management  Alcohol-Tobacco     Smoking Status: never  Current Medications (verified): 1)  Warfarin Sodium 2 Mg Tabs (Warfarin Sodium) .... Ad Directed 2)  Amiodarone Hcl 200 Mg Tabs (Amiodarone Hcl) .Marland Kitchen.. 1 Two Times A Day 3)  Coreg 6.25 Mg Tabs (Carvedilol) .Marland Kitchen.. 1 Two Times A Day 4)  Lanoxin 0.125 Mg Tabs (Digoxin) .Marland Kitchen.. 1 Every Other Day 5)  Hydralazine Hcl 25 Mg Tabs (Hydralazine Hcl) .... 1/2 Every 8 Hrs 6)  Xopenex Hfa 45 Mcg/act Aero (Levalbuterol Tartrate) .... 2 Puffs Two Times A Day As Needed 7)  Tylenol 325 Mg Tabs (Acetaminophen) .... As Needed  Allergies (verified): No Known Drug Allergies  Past History:  Past Medical History: Atrial fibrillation Pneumonia Viral dilated cardiomyopathy Hypertension  Past Surgical History: None  Family History: Brother wolf parkinson white Sister cancer Father prostrate cancer  Social History: Married with 2 children Works full Health and safety inspector Smoking Status:  never  Review of Systems  The patient complains of shortness of breath with activity, productive cough, irregular heartbeats, loss of appetite, weight change, difficulty swallowing, itching, hand/feet swelling, and change in color of mucus.  The patient denies shortness of breath at rest, non-productive cough, coughing up blood, chest pain, acid heartburn, indigestion, abdominal pain, sore throat, tooth/dental problems, headaches, nasal congestion/difficulty breathing through nose, sneezing, ear ache, anxiety, depression, joint stiffness or pain, rash, and fever.    Vital Signs:  Patient profile:   60 year old male Height:      76 inches Weight:      246 pounds BMI:      30.05 O2 Sat:      98 % on Room air Temp:     97.9 degrees F oral Pulse rate:   93 / minute BP sitting:   110 / 78  (left arm) Cuff size:   large  Vitals Entered By: Renold Genta RCP, LPN (June 05, 2010 3:01 PM)  O2 Flow:  Room air CC: Hospital follow up, sob with exertion, prod cough green past 2 days Comments Medications reviewed with patient Renold Genta RCP, LPN  June 05, 2010 3:13 PM    Physical Exam  General:  healthy appearing and thin.   Eyes:  PERRLA and EOMI.   Nose:  no deformity, discharge, inflammation, or lesions Mouth:  MP3, 2+ tonsills, elongated uvula Neck:  no JVD.   Lungs:  clear bilaterally to auscultation and percussion Heart:  regular rhythm, normal rate, and no murmurs.   Abdomen:  soft, +bowel sounds, nontender Extremities:  minimal ankle edema, no clubbing/cyanosis Neurologic:  normal CN II-XII.   Cervical Nodes:  no significant adenopathy   Impression & Recommendations:  Problem # 1:  COUGH (ICD-786.2) He has symptomatic response to xopenex, and will have him continue this.  He had recent chest xray which was relatively unremarkable.  His spirometry today was suggestive of restrictive defect.  I will arrange for full pulmonary function testing to further assess.  Problem # 2:  HYPOXEMIA (ICD-799.02)  There was concern during his hospital stay whether he could have sleep disordered breathing.  He has since improved.  Will arrange for overnight oximetry on room air, and then decide if additional testing is needed with a sleep study.  Problem # 3:  CARDIOMYOPATHY (ICD-425.4)  Likely from acute viral infection.  He is followed by Dr. Patty Sermons with cardiology.  Medications Added to Medication List This Visit: 1)  Warfarin Sodium 2 Mg Tabs (Warfarin sodium) .... Ad directed  Complete Medication List: 1)  Warfarin Sodium 2 Mg Tabs (Warfarin sodium) .... Ad directed 2)  Amiodarone Hcl 200 Mg Tabs (Amiodarone hcl) .Marland Kitchen.. 1 two times a  day 3)  Coreg 6.25 Mg Tabs (Carvedilol) .Marland Kitchen.. 1 two times a day 4)  Lanoxin 0.125 Mg Tabs (Digoxin) .Marland Kitchen.. 1 every other day 5)  Hydralazine Hcl 25 Mg Tabs (Hydralazine hcl) .... 1/2 every 8 hrs 6)  Xopenex Hfa 45 Mcg/act Aero (Levalbuterol tartrate) .... 2 puffs two times a day as needed 7)  Tylenol 325 Mg Tabs (Acetaminophen) .... As needed  Other Orders: Est. Patient Level IV (57846) Full Pulmonary Function Test (PFT) Spirometry w/Graph (94010) DME Referral (DME)  Patient Instructions: 1)  Will schedule breathing test (PFT) 2)  Will schedule oxygen test at night 3)  Follow up in 3 to 4 weeks

## 2010-06-12 ENCOUNTER — Encounter: Payer: Self-pay | Admitting: Pulmonary Disease

## 2010-06-13 ENCOUNTER — Ambulatory Visit: Payer: BC Managed Care – PPO | Admitting: Physical Therapy

## 2010-06-13 ENCOUNTER — Encounter: Payer: Self-pay | Admitting: Pulmonary Disease

## 2010-06-16 ENCOUNTER — Ambulatory Visit: Payer: BC Managed Care – PPO | Admitting: Physical Therapy

## 2010-06-16 ENCOUNTER — Ambulatory Visit (INDEPENDENT_AMBULATORY_CARE_PROVIDER_SITE_OTHER): Payer: BC Managed Care – PPO | Admitting: Cardiology

## 2010-06-16 DIAGNOSIS — I4891 Unspecified atrial fibrillation: Secondary | ICD-10-CM

## 2010-06-16 DIAGNOSIS — R0602 Shortness of breath: Secondary | ICD-10-CM

## 2010-06-16 DIAGNOSIS — Z7901 Long term (current) use of anticoagulants: Secondary | ICD-10-CM

## 2010-06-20 ENCOUNTER — Ambulatory Visit: Payer: BC Managed Care – PPO | Admitting: Physical Therapy

## 2010-06-23 ENCOUNTER — Encounter (INDEPENDENT_AMBULATORY_CARE_PROVIDER_SITE_OTHER): Payer: BC Managed Care – PPO

## 2010-06-23 DIAGNOSIS — Z7901 Long term (current) use of anticoagulants: Secondary | ICD-10-CM

## 2010-06-23 DIAGNOSIS — I4891 Unspecified atrial fibrillation: Secondary | ICD-10-CM

## 2010-06-25 ENCOUNTER — Ambulatory Visit (INDEPENDENT_AMBULATORY_CARE_PROVIDER_SITE_OTHER): Payer: BC Managed Care – PPO | Admitting: Pulmonary Disease

## 2010-06-25 ENCOUNTER — Encounter: Payer: Self-pay | Admitting: Pulmonary Disease

## 2010-06-25 ENCOUNTER — Encounter (INDEPENDENT_AMBULATORY_CARE_PROVIDER_SITE_OTHER): Payer: BC Managed Care – PPO

## 2010-06-25 DIAGNOSIS — R059 Cough, unspecified: Secondary | ICD-10-CM

## 2010-06-25 DIAGNOSIS — R0902 Hypoxemia: Secondary | ICD-10-CM

## 2010-06-25 DIAGNOSIS — R05 Cough: Secondary | ICD-10-CM

## 2010-06-25 DIAGNOSIS — I428 Other cardiomyopathies: Secondary | ICD-10-CM

## 2010-06-30 ENCOUNTER — Ambulatory Visit (INDEPENDENT_AMBULATORY_CARE_PROVIDER_SITE_OTHER): Payer: BC Managed Care – PPO | Admitting: Cardiology

## 2010-06-30 DIAGNOSIS — R0602 Shortness of breath: Secondary | ICD-10-CM

## 2010-06-30 DIAGNOSIS — I4891 Unspecified atrial fibrillation: Secondary | ICD-10-CM

## 2010-06-30 LAB — DIC (DISSEMINATED INTRAVASCULAR COAGULATION)PANEL
INR: 2.52 — ABNORMAL HIGH (ref 0.00–1.49)
Platelets: 138 10*3/uL — ABNORMAL LOW (ref 150–400)
Prothrombin Time: 27.3 seconds — ABNORMAL HIGH (ref 11.6–15.2)

## 2010-06-30 LAB — CBC
HCT: 30.9 % — ABNORMAL LOW (ref 39.0–52.0)
HCT: 37 % — ABNORMAL LOW (ref 39.0–52.0)
HCT: 37.5 % — ABNORMAL LOW (ref 39.0–52.0)
Hemoglobin: 10.5 g/dL — ABNORMAL LOW (ref 13.0–17.0)
Hemoglobin: 11.2 g/dL — ABNORMAL LOW (ref 13.0–17.0)
Hemoglobin: 11.6 g/dL — ABNORMAL LOW (ref 13.0–17.0)
Hemoglobin: 11.8 g/dL — ABNORMAL LOW (ref 13.0–17.0)
Hemoglobin: 11.9 g/dL — ABNORMAL LOW (ref 13.0–17.0)
MCH: 30.4 pg (ref 26.0–34.0)
MCH: 30.8 pg (ref 26.0–34.0)
MCH: 31.3 pg (ref 26.0–34.0)
MCHC: 30.9 g/dL (ref 30.0–36.0)
MCHC: 31.6 g/dL (ref 30.0–36.0)
MCHC: 33.4 g/dL (ref 30.0–36.0)
MCHC: 33.7 g/dL (ref 30.0–36.0)
MCV: 92.4 fL (ref 78.0–100.0)
MCV: 93.7 fL (ref 78.0–100.0)
MCV: 93.8 fL (ref 78.0–100.0)
MCV: 94.1 fL (ref 78.0–100.0)
MCV: 95 fL (ref 78.0–100.0)
MCV: 96.1 fL (ref 78.0–100.0)
MCV: 98.2 fL (ref 78.0–100.0)
Platelets: 112 10*3/uL — ABNORMAL LOW (ref 150–400)
Platelets: 133 10*3/uL — ABNORMAL LOW (ref 150–400)
Platelets: 191 10*3/uL (ref 150–400)
Platelets: 69 10*3/uL — ABNORMAL LOW (ref 150–400)
Platelets: 85 10*3/uL — ABNORMAL LOW (ref 150–400)
RBC: 3.35 MIL/uL — ABNORMAL LOW (ref 4.22–5.81)
RBC: 3.58 MIL/uL — ABNORMAL LOW (ref 4.22–5.81)
RBC: 3.85 MIL/uL — ABNORMAL LOW (ref 4.22–5.81)
RBC: 4.05 MIL/uL — ABNORMAL LOW (ref 4.22–5.81)
RDW: 14.6 % (ref 11.5–15.5)
RDW: 14.6 % (ref 11.5–15.5)
RDW: 14.7 % (ref 11.5–15.5)
RDW: 15.8 % — ABNORMAL HIGH (ref 11.5–15.5)
WBC: 10.6 10*3/uL — ABNORMAL HIGH (ref 4.0–10.5)
WBC: 12 10*3/uL — ABNORMAL HIGH (ref 4.0–10.5)
WBC: 12.8 10*3/uL — ABNORMAL HIGH (ref 4.0–10.5)
WBC: 13.6 10*3/uL — ABNORMAL HIGH (ref 4.0–10.5)
WBC: 14 10*3/uL — ABNORMAL HIGH (ref 4.0–10.5)
WBC: 9.3 10*3/uL (ref 4.0–10.5)

## 2010-06-30 LAB — PREPARE FRESH FROZEN PLASMA
Unit division: 0
Unit division: 0
Unit division: 0

## 2010-06-30 LAB — POCT I-STAT 3, ART BLOOD GAS (G3+)
Acid-Base Excess: 11 mmol/L — ABNORMAL HIGH (ref 0.0–2.0)
Acid-Base Excess: 14 mmol/L — ABNORMAL HIGH (ref 0.0–2.0)
Acid-Base Excess: 19 mmol/L — ABNORMAL HIGH (ref 0.0–2.0)
Acid-Base Excess: 4 mmol/L — ABNORMAL HIGH (ref 0.0–2.0)
Acid-Base Excess: 6 mmol/L — ABNORMAL HIGH (ref 0.0–2.0)
Acid-base deficit: 11 mmol/L — ABNORMAL HIGH (ref 0.0–2.0)
Acid-base deficit: 7 mmol/L — ABNORMAL HIGH (ref 0.0–2.0)
Bicarbonate: 17.1 mEq/L — ABNORMAL LOW (ref 20.0–24.0)
Bicarbonate: 30.9 mEq/L — ABNORMAL HIGH (ref 20.0–24.0)
Bicarbonate: 35.8 mEq/L — ABNORMAL HIGH (ref 20.0–24.0)
Bicarbonate: 39.5 mEq/L — ABNORMAL HIGH (ref 20.0–24.0)
Bicarbonate: 46.7 mEq/L — ABNORMAL HIGH (ref 20.0–24.0)
O2 Saturation: 91 %
O2 Saturation: 95 %
O2 Saturation: 96 %
O2 Saturation: 99 %
O2 Saturation: 99 %
Patient temperature: 33.5
Patient temperature: 36.4
Patient temperature: 36.6
Patient temperature: 37
Patient temperature: 95.4
TCO2: 31 mmol/L (ref 0–100)
TCO2: 31 mmol/L (ref 0–100)
TCO2: 32 mmol/L (ref 0–100)
TCO2: 34 mmol/L (ref 0–100)
TCO2: 40 mmol/L (ref 0–100)
TCO2: 42 mmol/L (ref 0–100)
pCO2 arterial: 29.8 mmHg — ABNORMAL LOW (ref 35.0–45.0)
pCO2 arterial: 33.9 mmHg — ABNORMAL LOW (ref 35.0–45.0)
pCO2 arterial: 41.1 mmHg (ref 35.0–45.0)
pCO2 arterial: 41.4 mmHg (ref 35.0–45.0)
pCO2 arterial: 42.1 mmHg (ref 35.0–45.0)
pCO2 arterial: 44.8 mmHg (ref 35.0–45.0)
pCO2 arterial: 48 mmHg — ABNORMAL HIGH (ref 35.0–45.0)
pH, Arterial: 7.186 — CL (ref 7.350–7.450)
pH, Arterial: 7.274 — ABNORMAL LOW (ref 7.350–7.450)
pH, Arterial: 7.338 — ABNORMAL LOW (ref 7.350–7.450)
pH, Arterial: 7.365 (ref 7.350–7.450)
pH, Arterial: 7.421 (ref 7.350–7.450)
pH, Arterial: 7.473 — ABNORMAL HIGH (ref 7.350–7.450)
pH, Arterial: 7.57 — ABNORMAL HIGH (ref 7.350–7.450)
pO2, Arterial: 135 mmHg — ABNORMAL HIGH (ref 80.0–100.0)
pO2, Arterial: 58 mmHg — ABNORMAL LOW (ref 80.0–100.0)
pO2, Arterial: 62 mmHg — ABNORMAL LOW (ref 80.0–100.0)
pO2, Arterial: 85 mmHg (ref 80.0–100.0)
pO2, Arterial: 97 mmHg (ref 80.0–100.0)

## 2010-06-30 LAB — DIFFERENTIAL
Basophils Absolute: 0 10*3/uL (ref 0.0–0.1)
Basophils Relative: 0 % (ref 0–1)
Eosinophils Absolute: 0 10*3/uL (ref 0.0–0.7)
Eosinophils Absolute: 0 10*3/uL (ref 0.0–0.7)
Eosinophils Absolute: 0 10*3/uL (ref 0.0–0.7)
Eosinophils Relative: 0 % (ref 0–5)
Eosinophils Relative: 0 % (ref 0–5)
Eosinophils Relative: 0 % (ref 0–5)
Eosinophils Relative: 1 % (ref 0–5)
Lymphocytes Relative: 10 % — ABNORMAL LOW (ref 12–46)
Lymphocytes Relative: 5 % — ABNORMAL LOW (ref 12–46)
Lymphocytes Relative: 7 % — ABNORMAL LOW (ref 12–46)
Lymphocytes Relative: 9 % — ABNORMAL LOW (ref 12–46)
Lymphs Abs: 0.7 10*3/uL (ref 0.7–4.0)
Lymphs Abs: 1 10*3/uL (ref 0.7–4.0)
Lymphs Abs: 1 10*3/uL (ref 0.7–4.0)
Monocytes Relative: 10 % (ref 3–12)
Monocytes Relative: 11 % (ref 3–12)
Neutro Abs: 6.8 10*3/uL (ref 1.7–7.7)
Neutro Abs: 9.7 10*3/uL — ABNORMAL HIGH (ref 1.7–7.7)
Neutrophils Relative %: 81 % — ABNORMAL HIGH (ref 43–77)
Neutrophils Relative %: 82 % — ABNORMAL HIGH (ref 43–77)
Neutrophils Relative %: 84 % — ABNORMAL HIGH (ref 43–77)

## 2010-06-30 LAB — ALCOHOL, METHYL (METHANOL), BLOOD

## 2010-06-30 LAB — EPSTEIN-BARR VIRUS VCA ANTIBODY PANEL
EBV EA IgG: 0.91 {ISR} — ABNORMAL HIGH
EBV VCA IgG: 1.9 {ISR} — ABNORMAL HIGH
EBV VCA IgM: 0.28 {ISR}

## 2010-06-30 LAB — COMPREHENSIVE METABOLIC PANEL
ALT: 1918 U/L — ABNORMAL HIGH (ref 0–53)
AST: 2553 U/L — ABNORMAL HIGH (ref 0–37)
Albumin: 3.1 g/dL — ABNORMAL LOW (ref 3.5–5.2)
Albumin: 3.4 g/dL — ABNORMAL LOW (ref 3.5–5.2)
Albumin: 3.4 g/dL — ABNORMAL LOW (ref 3.5–5.2)
Albumin: 3.4 g/dL — ABNORMAL LOW (ref 3.5–5.2)
Albumin: 3.5 g/dL (ref 3.5–5.2)
Alkaline Phosphatase: 55 U/L (ref 39–117)
Alkaline Phosphatase: 59 U/L (ref 39–117)
Alkaline Phosphatase: 65 U/L (ref 39–117)
Alkaline Phosphatase: 69 U/L (ref 39–117)
Alkaline Phosphatase: 79 U/L (ref 39–117)
BUN: 27 mg/dL — ABNORMAL HIGH (ref 6–23)
BUN: 32 mg/dL — ABNORMAL HIGH (ref 6–23)
BUN: 52 mg/dL — ABNORMAL HIGH (ref 6–23)
BUN: 56 mg/dL — ABNORMAL HIGH (ref 6–23)
BUN: 57 mg/dL — ABNORMAL HIGH (ref 6–23)
BUN: 57 mg/dL — ABNORMAL HIGH (ref 6–23)
CO2: 26 mEq/L (ref 19–32)
CO2: 30 mEq/L (ref 19–32)
CO2: 41 mEq/L (ref 19–32)
Calcium: 7.9 mg/dL — ABNORMAL LOW (ref 8.4–10.5)
Calcium: 8.2 mg/dL — ABNORMAL LOW (ref 8.4–10.5)
Chloride: 102 mEq/L (ref 96–112)
Chloride: 105 mEq/L (ref 96–112)
Chloride: 83 mEq/L — ABNORMAL LOW (ref 96–112)
Chloride: 95 mEq/L — ABNORMAL LOW (ref 96–112)
Creatinine, Ser: 1.5 mg/dL (ref 0.4–1.5)
Creatinine, Ser: 1.67 mg/dL — ABNORMAL HIGH (ref 0.4–1.5)
Creatinine, Ser: 2.12 mg/dL — ABNORMAL HIGH (ref 0.4–1.5)
GFR calc Af Amer: 40 mL/min — ABNORMAL LOW (ref >60)
GFR calc non Af Amer: 33 mL/min — ABNORMAL LOW (ref >60)
GFR calc non Af Amer: 36 mL/min — ABNORMAL LOW (ref >60)
GFR calc non Af Amer: 48 mL/min — ABNORMAL LOW (ref >60)
Glucose, Bld: 125 mg/dL — ABNORMAL HIGH (ref 70–99)
Glucose, Bld: 150 mg/dL — ABNORMAL HIGH (ref 70–99)
Glucose, Bld: 99 mg/dL (ref 70–99)
Potassium: 3 mEq/L — ABNORMAL LOW (ref 3.5–5.1)
Potassium: 3.5 mEq/L (ref 3.5–5.1)
Potassium: 4.1 mEq/L (ref 3.5–5.1)
Potassium: 4.4 mEq/L (ref 3.5–5.1)
Potassium: 4.5 mEq/L (ref 3.5–5.1)
Sodium: 128 mEq/L — ABNORMAL LOW (ref 135–145)
Sodium: 133 mEq/L — ABNORMAL LOW (ref 135–145)
Total Bilirubin: 2.5 mg/dL — ABNORMAL HIGH (ref 0.3–1.2)
Total Bilirubin: 4.3 mg/dL — ABNORMAL HIGH (ref 0.3–1.2)
Total Bilirubin: 5.2 mg/dL — ABNORMAL HIGH (ref 0.3–1.2)
Total Protein: 5.5 g/dL — ABNORMAL LOW (ref 6.0–8.3)
Total Protein: 5.6 g/dL — ABNORMAL LOW (ref 6.0–8.3)
Total Protein: 5.8 g/dL — ABNORMAL LOW (ref 6.0–8.3)

## 2010-06-30 LAB — BODY FLUID CULTURE

## 2010-06-30 LAB — RENAL FUNCTION PANEL
Albumin: 2.9 g/dL — ABNORMAL LOW (ref 3.5–5.2)
Albumin: 3.1 g/dL — ABNORMAL LOW (ref 3.5–5.2)
Albumin: 3.2 g/dL — ABNORMAL LOW (ref 3.5–5.2)
Albumin: 3.6 g/dL (ref 3.5–5.2)
BUN: 18 mg/dL (ref 6–23)
BUN: 22 mg/dL (ref 6–23)
CO2: 29 mEq/L (ref 19–32)
CO2: 34 mEq/L — ABNORMAL HIGH (ref 19–32)
CO2: 36 mEq/L — ABNORMAL HIGH (ref 19–32)
Calcium: 6.9 mg/dL — ABNORMAL LOW (ref 8.4–10.5)
Calcium: 7.1 mg/dL — ABNORMAL LOW (ref 8.4–10.5)
Calcium: 7.5 mg/dL — ABNORMAL LOW (ref 8.4–10.5)
Calcium: 7.7 mg/dL — ABNORMAL LOW (ref 8.4–10.5)
Chloride: 100 mEq/L (ref 96–112)
Chloride: 103 mEq/L (ref 96–112)
Chloride: 106 mEq/L (ref 96–112)
Chloride: 94 mEq/L — ABNORMAL LOW (ref 96–112)
Creatinine, Ser: 1.43 mg/dL (ref 0.4–1.5)
Creatinine, Ser: 1.77 mg/dL — ABNORMAL HIGH (ref 0.4–1.5)
Creatinine, Ser: 1.88 mg/dL — ABNORMAL HIGH (ref 0.4–1.5)
GFR calc Af Amer: 45 mL/min — ABNORMAL LOW (ref >60)
GFR calc Af Amer: 48 mL/min — ABNORMAL LOW (ref >60)
GFR calc Af Amer: 56 mL/min — ABNORMAL LOW (ref >60)
GFR calc non Af Amer: 37 mL/min — ABNORMAL LOW (ref >60)
GFR calc non Af Amer: 40 mL/min — ABNORMAL LOW (ref >60)
GFR calc non Af Amer: 46 mL/min — ABNORMAL LOW (ref >60)
GFR calc non Af Amer: 46 mL/min — ABNORMAL LOW (ref >60)
Glucose, Bld: 115 mg/dL — ABNORMAL HIGH (ref 70–99)
Glucose, Bld: 116 mg/dL — ABNORMAL HIGH (ref 70–99)
Glucose, Bld: 95 mg/dL (ref 70–99)
Phosphorus: 3.5 mg/dL (ref 2.3–4.6)
Phosphorus: 4.1 mg/dL (ref 2.3–4.6)
Phosphorus: 4.6 mg/dL (ref 2.3–4.6)
Phosphorus: 4.8 mg/dL — ABNORMAL HIGH (ref 2.3–4.6)
Phosphorus: 6.2 mg/dL — ABNORMAL HIGH (ref 2.3–4.6)
Potassium: 4 mEq/L (ref 3.5–5.1)
Potassium: 4.2 mEq/L (ref 3.5–5.1)
Potassium: 4.3 mEq/L (ref 3.5–5.1)
Sodium: 129 mEq/L — ABNORMAL LOW (ref 135–145)
Sodium: 136 mEq/L (ref 135–145)
Sodium: 136 mEq/L (ref 135–145)
Sodium: 137 mEq/L (ref 135–145)
Sodium: 138 mEq/L (ref 135–145)

## 2010-06-30 LAB — PROCALCITONIN
Procalcitonin: 0.37 ng/mL
Procalcitonin: 0.84 ng/mL
Procalcitonin: 1 ng/mL

## 2010-06-30 LAB — BASIC METABOLIC PANEL
BUN: 25 mg/dL — ABNORMAL HIGH (ref 6–23)
CO2: 28 mEq/L (ref 19–32)
CO2: 29 mEq/L (ref 19–32)
Calcium: 7.9 mg/dL — ABNORMAL LOW (ref 8.4–10.5)
Chloride: 103 mEq/L (ref 96–112)
Creatinine, Ser: 1.82 mg/dL — ABNORMAL HIGH (ref 0.4–1.5)
GFR calc Af Amer: 45 mL/min — ABNORMAL LOW (ref >60)
GFR calc non Af Amer: 38 mL/min — ABNORMAL LOW (ref >60)
Glucose, Bld: 115 mg/dL — ABNORMAL HIGH (ref 70–99)
Potassium: 3.9 mEq/L (ref 3.5–5.1)
Sodium: 136 mEq/L (ref 135–145)

## 2010-06-30 LAB — BLOOD GAS, ARTERIAL
Acid-base deficit: 13.6 mmol/L — ABNORMAL HIGH (ref 0.0–2.0)
Bicarbonate: 10.6 mEq/L — ABNORMAL LOW (ref 20.0–24.0)
Drawn by: 244861
O2 Content: 6 L/min
TCO2: 11.2 mmol/L (ref 0–100)
pCO2 arterial: 17.9 mmHg — CL (ref 35.0–45.0)
pO2, Arterial: 109 mmHg — ABNORMAL HIGH (ref 80.0–100.0)

## 2010-06-30 LAB — TSH: TSH: 1.99 u[IU]/mL (ref 0.350–4.500)

## 2010-06-30 LAB — TROPONIN I
Troponin I: 0.05 ng/mL (ref 0.00–0.06)
Troponin I: 0.05 ng/mL (ref 0.00–0.06)

## 2010-06-30 LAB — URINE CULTURE
Colony Count: NO GROWTH
Culture: NO GROWTH

## 2010-06-30 LAB — CULTURE, BLOOD (ROUTINE X 2)
Culture  Setup Time: 201112291352
Culture  Setup Time: 201112291352
Culture: NO GROWTH
Culture: NO GROWTH

## 2010-06-30 LAB — APTT
aPTT: 32 seconds (ref 24–37)
aPTT: 34 seconds (ref 24–37)
aPTT: 35 seconds (ref 24–37)
aPTT: 42 seconds — ABNORMAL HIGH (ref 24–37)

## 2010-06-30 LAB — BODY FLUID CELL COUNT WITH DIFFERENTIAL
Eos, Fluid: 0 %
Monocyte-Macrophage-Serous Fluid: 14 % — ABNORMAL LOW (ref 50–90)
Neutrophil Count, Fluid: 0 % (ref 0–25)
Total Nucleated Cell Count, Fluid: 85 cu mm (ref 0–1000)

## 2010-06-30 LAB — LIPID PANEL
HDL: 30 mg/dL — ABNORMAL LOW (ref >39)
Total CHOL/HDL Ratio: 5 RATIO
VLDL: 15 mg/dL (ref 0–40)

## 2010-06-30 LAB — CMV (CYTOMEGALOVIRUS) DNA ULTRAQUANT, PCR: CMV DNA Quant: <200 copies/mL (ref <200)

## 2010-06-30 LAB — MAGNESIUM
Magnesium: 2.1 mg/dL (ref 1.5–2.5)
Magnesium: 2.1 mg/dL (ref 1.5–2.5)
Magnesium: 2.3 mg/dL (ref 1.5–2.5)
Magnesium: 2.5 mg/dL (ref 1.5–2.5)
Magnesium: 2.5 mg/dL (ref 1.5–2.5)
Magnesium: 2.6 mg/dL — ABNORMAL HIGH (ref 1.5–2.5)
Magnesium: 2.7 mg/dL — ABNORMAL HIGH (ref 1.5–2.5)

## 2010-06-30 LAB — LACTATE DEHYDROGENASE, PLEURAL OR PERITONEAL FLUID: LD, Fluid: 130 U/L — ABNORMAL HIGH (ref 3–23)

## 2010-06-30 LAB — DIGOXIN LEVEL
Digoxin Level: 0.8 ng/mL (ref 0.8–2.0)
Digoxin Level: 1.3 ng/mL (ref 0.8–2.0)
Digoxin Level: 1.9 ng/mL (ref 0.8–2.0)

## 2010-06-30 LAB — CARDIAC PANEL(CRET KIN+CKTOT+MB+TROPI)
CK, MB: 3.3 ng/mL (ref 0.3–4.0)
Relative Index: 3.2 — ABNORMAL HIGH (ref 0.0–2.5)
Relative Index: INVALID (ref 0.0–2.5)
Total CK: 103 U/L (ref 7–232)
Troponin I: 0.05 ng/mL (ref 0.00–0.06)
Troponin I: 0.06 ng/mL (ref 0.00–0.06)
Troponin I: 0.08 ng/mL — ABNORMAL HIGH (ref 0.00–0.06)

## 2010-06-30 LAB — AMYLASE, BODY FLUID: Amylase, Fluid: 9 U/L

## 2010-06-30 LAB — URINALYSIS, ROUTINE W REFLEX MICROSCOPIC
Glucose, UA: NEGATIVE mg/dL
Hgb urine dipstick: NEGATIVE
Ketones, ur: 15 mg/dL — AB
Protein, ur: 30 mg/dL — AB
Urobilinogen, UA: 1 mg/dL (ref 0.0–1.0)

## 2010-06-30 LAB — HEPATITIS PANEL, ACUTE
Hep A IgM: NEGATIVE
Hep A IgM: NEGATIVE
Hep B C IgM: NEGATIVE

## 2010-06-30 LAB — DRUGS OF ABUSE SCREEN W/O ALC, ROUTINE URINE
Amphetamine Screen, Ur: NEGATIVE
Creatinine,U: 275.8 mg/dL
Marijuana Metabolite: NEGATIVE
Opiate Screen, Urine: NEGATIVE
Propoxyphene: NEGATIVE

## 2010-06-30 LAB — HEPATIC FUNCTION PANEL
ALT: 1053 U/L — ABNORMAL HIGH (ref 0–53)
Bilirubin, Direct: 2.5 mg/dL — ABNORMAL HIGH (ref 0.0–0.3)
Indirect Bilirubin: 2.2 mg/dL — ABNORMAL HIGH (ref 0.3–0.9)
Total Protein: 5.5 g/dL — ABNORMAL LOW (ref 6.0–8.3)

## 2010-06-30 LAB — AMYLASE: Amylase: 66 U/L (ref 0–105)

## 2010-06-30 LAB — MRSA PCR SCREENING: MRSA by PCR: NEGATIVE

## 2010-06-30 LAB — NA AND K (SODIUM & POTASSIUM), RAND UR
Potassium Urine: 69 mEq/L
Sodium, Ur: <10 mEq/L

## 2010-06-30 LAB — ANTI-NEUTROPHIL ANTIBODY

## 2010-06-30 LAB — CULTURE, BAL-QUANTITATIVE W GRAM STAIN: Colony Count: >=100000

## 2010-06-30 LAB — URINE MICROSCOPIC-ADD ON

## 2010-06-30 LAB — CK TOTAL AND CKMB (NOT AT ARMC)
CK, MB: 4.8 ng/mL — ABNORMAL HIGH (ref 0.3–4.0)
Relative Index: 2.9 — ABNORMAL HIGH (ref 0.0–2.5)
Total CK: 138 U/L (ref 7–232)

## 2010-06-30 LAB — TECHNOLOGIST SMEAR REVIEW

## 2010-06-30 LAB — PHOSPHORUS
Phosphorus: 4.1 mg/dL (ref 2.3–4.6)
Phosphorus: 5.2 mg/dL — ABNORMAL HIGH (ref 2.3–4.6)
Phosphorus: 7.3 mg/dL — ABNORMAL HIGH (ref 2.3–4.6)

## 2010-06-30 LAB — MISCELLANEOUS TEST

## 2010-06-30 LAB — ANTI-SMOOTH MUSCLE ANTIBODY, IGG: F-Actin IgG: <20

## 2010-06-30 LAB — CARBOXYHEMOGLOBIN
Carboxyhemoglobin: 1.7 % — ABNORMAL HIGH (ref 0.5–1.5)
O2 Saturation: 81.1 %
Total hemoglobin: 10.9 g/dL — ABNORMAL LOW (ref 13.5–18.0)

## 2010-06-30 LAB — GLUCOSE, CAPILLARY
Glucose-Capillary: 146 mg/dL — ABNORMAL HIGH (ref 70–99)
Glucose-Capillary: 153 mg/dL — ABNORMAL HIGH (ref 70–99)

## 2010-06-30 LAB — PROTIME-INR
INR: 2.84 — ABNORMAL HIGH (ref 0.00–1.49)
Prothrombin Time: 16.6 seconds — ABNORMAL HIGH (ref 11.6–15.2)
Prothrombin Time: 23.9 seconds — ABNORMAL HIGH (ref 11.6–15.2)

## 2010-06-30 LAB — LACTATE DEHYDROGENASE: LDH: 1020 U/L — ABNORMAL HIGH (ref 94–250)

## 2010-06-30 LAB — LEGIONELLA ANTIGEN, URINE: Legionella Antigen, Urine: NEGATIVE

## 2010-06-30 LAB — CYTOMEGALOVIRUS ANTIBODY, IGG: Cytomegalovirus(CMV) Antibody, IgG: POSITIVE — AB

## 2010-06-30 LAB — CERULOPLASMIN: Ceruloplasmin: 34 mg/dL (ref 21–63)

## 2010-06-30 LAB — ETHYLENE GLYCOL: Ethylene Glycol Lvl: NEGATIVE

## 2010-06-30 LAB — SALICYLATE LEVEL: Salicylate Lvl: <4 mg/dL (ref 2.8–20.0)

## 2010-06-30 LAB — HIV ANTIBODY (ROUTINE TESTING W REFLEX): HIV: NONREACTIVE

## 2010-06-30 LAB — PROTEIN, BODY FLUID: Total protein, fluid: <3 g/dL

## 2010-06-30 LAB — ALCOHOL,  ISOPROPYL (ISOPROPANOL)
Acetone: NEGATIVE
Isopropanol: NEGATIVE

## 2010-06-30 LAB — FIBRINOGEN: Fibrinogen: 125 mg/dL — ABNORMAL LOW (ref 204–475)

## 2010-06-30 LAB — CHLORIDE, URINE, RANDOM: Chloride Urine: 32 mEq/L

## 2010-06-30 LAB — CREATININE, URINE, RANDOM: Creatinine, Urine: 300.9 mg/dL

## 2010-07-01 NOTE — Miscellaneous (Signed)
Summary: Room air overnight oximetry  Clinical Lists Changes Test time 6hrs 44 min.  Basal SpO2 92.5%, low 75%.  Spent with SpO2 < 88%.  Oxygen desaturation pattern suggestive of relation to REM sleep timings.  Will arrange for sleep test to further evaluate for sleep disordered breathing.

## 2010-07-01 NOTE — Assessment & Plan Note (Signed)
Summary: 3-4 week ov /pft @ 9:00/mhh   Copy to:  [name] Primary Provider/Referring Provider:  Dr. Barry Brunner Farm  CC:  rov with pft. Pt states his cough is some better, still has occas cough, and occas sob.Marland Kitchen  History of Present Illness: 60 yo male with Cough, Hypoxemia, and non-ischemic cardiomyopathy.  He has occasional cough w/o sputum.  This is improving.  He uses xopenex maybe once per day, and this helps.  He denies wheeze or chest congestion.    He had recent ONO on room air which showed oxygen desats with suggestion of relation to REM sleep.  He does snore, and feels tired all the time.  His wife says he gets hard breathing while asleep.  PFT today>>Borderline obstruction.  No bronchodilator response.  Normal lung volumes.  Mild diffusion defect corrects for lung volumes.  Preventive Screening-Counseling & Management  Alcohol-Tobacco     Smoking Status: never  Current Medications (verified): 1)  Warfarin Sodium 2 Mg Tabs (Warfarin Sodium) .... Ad Directed 2)  Amiodarone Hcl 200 Mg Tabs (Amiodarone Hcl) .Marland Kitchen.. 1 Two Times A Day 3)  Coreg 6.25 Mg Tabs (Carvedilol) .Marland Kitchen.. 1 Two Times A Day 4)  Lanoxin 0.125 Mg Tabs (Digoxin) .Marland Kitchen.. 1 Every Other Day 5)  Hydralazine Hcl 25 Mg Tabs (Hydralazine Hcl) .... 1/2 Every 8 Hrs 6)  Xopenex Hfa 45 Mcg/act Aero (Levalbuterol Tartrate) .... 2 Puffs Two Times A Day As Needed 7)  Tylenol 325 Mg Tabs (Acetaminophen) .... As Needed 8)  Lisinopril 5 Mg Tabs (Lisinopril) .... Once Daily  Allergies (verified): No Known Drug Allergies  Past History:  Past Medical History: Atrial fibrillation Pneumonia Viral dilated cardiomyopathy Hypertension Cough      - PFT 06/25/10>>FEV1 2.94(77%), FEV1% 73, TLC 6.75(83%), DLCO 70%, no BD  Past Surgical History: Reviewed history from 06/05/2010 and no changes required. None  Social History: Married with 2 children Works full Health and safety inspector Patient never smoked.   Vital  Signs:  Patient profile:   60 year old male Height:      76 inches Weight:      232 pounds BMI:     28.34 O2 Sat:      99 % on Room air Temp:     98 degrees F oral Pulse rate:   74 / minute BP sitting:   114 / 66  (left arm) Cuff size:   large  Vitals Entered By: Carver Fila (June 25, 2010 10:07 AM)  O2 Flow:  Room air CC: rov with pft. Pt states his cough is some better, still has occas cough, occas sob. Comments meds and allergies updated Phone number updated Carver Fila  June 25, 2010 10:08 AM    Physical Exam  General:  healthy appearing and thin.   Nose:  no deformity, discharge, inflammation, or lesions Mouth:  MP3, 2+ tonsills, elongated uvula Neck:  no JVD.   Lungs:  clear bilaterally to auscultation and percussion Heart:  regular rhythm, normal rate, and no murmurs.   Extremities:  minimal ankle edema, no clubbing/cyanosis Neurologic:  normal CN II-XII.   Cervical Nodes:  no significant adenopathy   Impression & Recommendations:  Problem # 1:  COUGH (ICD-786.2)  His PFT's showed borderline obstruction, but overall there was not significanct deficits.  He is to continue as needed xopenex.  Problem # 2:  HYPOXEMIA (ICD-799.02)  His recent overnight oximetry showed significant oxygen desaturation, but appeared to be related to REM sleep pattern.  He  snores, and has witnessed apnea. He has cardiomyopathy.  To further assess will arrange for sleep study.  Problem # 3:  CARDIOMYOPATHY (ICD-425.4)  Likely from acute viral infection.  He is followed by Dr. Patty Sermons with cardiology.  Medications Added to Medication List This Visit: 1)  Lisinopril 5 Mg Tabs (Lisinopril) .... Once daily  Complete Medication List: 1)  Warfarin Sodium 2 Mg Tabs (Warfarin sodium) .... Ad directed 2)  Amiodarone Hcl 200 Mg Tabs (Amiodarone hcl) .Marland Kitchen.. 1 two times a day 3)  Coreg 6.25 Mg Tabs (Carvedilol) .Marland Kitchen.. 1 two times a day 4)  Lanoxin 0.125 Mg Tabs (Digoxin) .Marland Kitchen.. 1 every other  day 5)  Hydralazine Hcl 25 Mg Tabs (Hydralazine hcl) .... 1/2 every 8 hrs 6)  Xopenex Hfa 45 Mcg/act Aero (Levalbuterol tartrate) .... 2 puffs two times a day as needed 7)  Tylenol 325 Mg Tabs (Acetaminophen) .... As needed 8)  Lisinopril 5 Mg Tabs (Lisinopril) .... Once daily  Other Orders: Est. Patient Level IV (69629) Sleep Study (Sleep Study)  Patient Instructions: 1)  Xopenex two puffs up to four times per day as needed 2)  Will schedule sleep test 3)  Will call to schedule follow up after sleep test reviewed   Immunization History:  Influenza Immunization History:    Influenza:  historical (02/18/2009)

## 2010-07-01 NOTE — Assessment & Plan Note (Signed)
Summary: pft charges   Pulmonary Function Test Date: 06/25/2010 Height (in.): 76 Gender: Male  Pre-Spirometry FVC    Value: 4.21 L/min   Pred: 5.53 L/min     % Pred: 76 % FEV1    Value: 3.01 L     Pred: 3.84 L     % Pred: 78 % FEV1/FVC  Value: 71 %     Pred: 69 %     % Pred: . % FEF 25-75  Value: 1.86 L/min   Pred: 3.43 L/min     % Pred: 54 %  Post-Spirometry FVC    Value: 4.02 L/min   Pred: 5.53 L/min     % Pred: 73 % FEV1    Value: 2.94 L     Pred: 3.84 L     % Pred: 77 % FEV1/FVC  Value: 73 %     Pred: 69 %     % Pred: . % FEF 25-75  Value: 2.04 L/min   Pred: 3.43 L/min     % Pred: 60 %  Lung Volumes TLC    Value: 6.75 L   % Pred: 83 % RV    Value: 2.53 L   % Pred: 92 % DLCO    Value: 20.5 %   % Pred: 70 % DLCO/VA  Value: 4.26 %   % Pred: 111 %  Comments: Borderline obstruction.  No bronchodilator response.  Normal lung volumes.  Mild diffusion defect corrects for lung volumes.  Allergies: No Known Drug Allergies   Pulmonary Function Test Date: 06/25/2010 Height (in.): 76 Gender: Male  Pre-Spirometry FVC    Value: 4.21 L/min   Pred: 5.53 L/min     % Pred: 76 % FEV1    Value: 3.01 L     Pred: 3.84 L     % Pred: 78 % FEV1/FVC  Value: 71 %     Pred: 69 %     % Pred: . % FEF 25-75  Value: 1.86 L/min   Pred: 3.43 L/min     % Pred: 54 %  Post-Spirometry FVC    Value: 4.02 L/min   Pred: 5.53 L/min     % Pred: 73 % FEV1    Value: 2.94 L     Pred: 3.84 L     % Pred: 77 % FEV1/FVC  Value: 73 %     Pred: 69 %     % Pred: . % FEF 25-75  Value: 2.04 L/min   Pred: 3.43 L/min     % Pred: 60 %  Lung Volumes TLC    Value: 6.75 L   % Pred: 83 % RV    Value: 2.53 L   % Pred: 92 % DLCO    Value: 20.5 %   % Pred: 70 % DLCO/VA  Value: 4.26 %   % Pred: 111 %  Comments: Borderline obstruction.  No bronchodilator response.  Normal lung volumes.  Mild diffusion defect corrects for lung volumes.  Other Orders: Carbon Monoxide diffusing w/capacity (16109) Lung Volumes/Gas dilution  or washout (60454) Spirometry (Pre & Post) [phone])

## 2010-07-02 ENCOUNTER — Encounter: Payer: BC Managed Care – PPO | Attending: Physical Medicine & Rehabilitation

## 2010-07-02 ENCOUNTER — Inpatient Hospital Stay (HOSPITAL_BASED_OUTPATIENT_CLINIC_OR_DEPARTMENT_OTHER): Payer: BC Managed Care – PPO | Admitting: Physical Medicine & Rehabilitation

## 2010-07-02 DIAGNOSIS — R269 Unspecified abnormalities of gait and mobility: Secondary | ICD-10-CM

## 2010-07-02 DIAGNOSIS — G9341 Metabolic encephalopathy: Secondary | ICD-10-CM

## 2010-07-02 DIAGNOSIS — I428 Other cardiomyopathies: Secondary | ICD-10-CM | POA: Insufficient documentation

## 2010-07-02 DIAGNOSIS — R5381 Other malaise: Secondary | ICD-10-CM | POA: Insufficient documentation

## 2010-07-07 ENCOUNTER — Encounter (INDEPENDENT_AMBULATORY_CARE_PROVIDER_SITE_OTHER): Payer: BC Managed Care – PPO

## 2010-07-07 DIAGNOSIS — I4891 Unspecified atrial fibrillation: Secondary | ICD-10-CM

## 2010-07-07 DIAGNOSIS — Z7901 Long term (current) use of anticoagulants: Secondary | ICD-10-CM

## 2010-07-16 ENCOUNTER — Other Ambulatory Visit: Payer: Self-pay | Admitting: *Deleted

## 2010-07-16 ENCOUNTER — Other Ambulatory Visit: Payer: Self-pay | Admitting: Cardiology

## 2010-07-16 DIAGNOSIS — Z419 Encounter for procedure for purposes other than remedying health state, unspecified: Secondary | ICD-10-CM

## 2010-07-16 DIAGNOSIS — I4891 Unspecified atrial fibrillation: Secondary | ICD-10-CM

## 2010-07-17 ENCOUNTER — Encounter: Payer: Self-pay | Admitting: Cardiology

## 2010-07-17 ENCOUNTER — Ambulatory Visit (INDEPENDENT_AMBULATORY_CARE_PROVIDER_SITE_OTHER): Payer: BC Managed Care – PPO | Admitting: Cardiology

## 2010-07-17 DIAGNOSIS — I428 Other cardiomyopathies: Secondary | ICD-10-CM

## 2010-07-17 DIAGNOSIS — Z419 Encounter for procedure for purposes other than remedying health state, unspecified: Secondary | ICD-10-CM

## 2010-07-17 DIAGNOSIS — I4891 Unspecified atrial fibrillation: Secondary | ICD-10-CM | POA: Insufficient documentation

## 2010-07-17 LAB — CBC WITH DIFFERENTIAL/PLATELET
Basophils Relative: 0.9 % (ref 0.0–3.0)
Eosinophils Absolute: 0.2 10*3/uL (ref 0.0–0.7)
Hemoglobin: 13.4 g/dL (ref 13.0–17.0)
Lymphocytes Relative: 20.8 % (ref 12.0–46.0)
MCHC: 33.9 g/dL (ref 30.0–36.0)
Monocytes Relative: 7.8 % (ref 3.0–12.0)
Neutro Abs: 4 10*3/uL (ref 1.4–7.7)
RBC: 4.24 Mil/uL (ref 4.22–5.81)

## 2010-07-17 LAB — BASIC METABOLIC PANEL
BUN: 20 mg/dL (ref 6–23)
Chloride: 110 mEq/L (ref 96–112)
Glucose, Bld: 88 mg/dL (ref 70–99)
Potassium: 4.5 mEq/L (ref 3.5–5.1)

## 2010-07-17 LAB — POCT INR: INR: 3.2

## 2010-07-17 NOTE — Progress Notes (Signed)
History of Present Illness: This pleasant 60 year old gentleman is seen for a scheduled followup office visit.  He presented on 04/16/10 to the hospital with acute renal failure and acute liver failure and severe left ventricular function.  His initial echocardiogram showed a left ventricular ejection fraction of only 10-15%.  He had a prolonged hospital course in the intensive care unit on ventilator.  He eventually improved and was able to go to the rehabilitation unit and from there he went home.  He has not yet been able to return to work.  He is only working 4-6 hours a day because of marked fatigue related to his medical condition.  Current Outpatient Prescriptions  Medication Sig Dispense Refill  . amiodarone (PACERONE) 200 MG tablet Take 200 mg by mouth 2 (two) times daily.        . carvedilol (COREG) 6.25 MG tablet Take 6.25 mg by mouth 2 (two) times daily with a meal.        . digoxin (LANOXIN) 0.125 MG tablet Take 125 mcg by mouth every other day.        . hydrALAZINE (APRESOLINE) 25 MG tablet Take 25 mg by mouth 3 (three) times daily.        Marland Kitchen levalbuterol (XOPENEX HFA) 45 MCG/ACT inhaler Inhale 1-2 puffs into the lungs every 4 (four) hours as needed.        Marland Kitchen lisinopril (PRINIVIL,ZESTRIL) 5 MG tablet Take 5 mg by mouth daily.        Marland Kitchen warfarin (COUMADIN) 2 MG tablet Take 2 mg by mouth daily. Taking 2mg .for 3 days and 4mg . Four days or as directed         No Known Allergies  Patient Active Problem List  Diagnoses  . CARDIOMYOPATHY  . Atrial fibrillation  . COUGH  . HYPOXEMIA  . A-fib    History  Smoking status  . Never Smoker   Smokeless tobacco  . Not on file    History  Alcohol Use: Not on file    No family history on file.  Review of Systems: Constitutional: no fever chills diaphoresis or fatigue or change in weight.  Head and neck: no hearing loss, no epistaxis, no photophobia or visual disturbance. Respiratory: No cough, shortness of breath or  wheezing. Cardiovascular: No chest pain peripheral edema, palpitations. Gastrointestinal: No abdominal distention, no abdominal pain, no change in bowel habits hematochezia or melena. Genitourinary: No dysuria, no frequency, no urgency, no nocturia. Musculoskeletal:No arthralgias, no back pain, no gait disturbance or myalgias. Neurological: No dizziness, no headaches, no numbness, no seizures, no syncope, no weakness, no tremors. Hematologic: No lymphadenopathy, no easy bruising. Psychiatric: No confusion, no hallucinations, no sleep disturbance.    Physical Exam: Filed Vitals:   07/17/10 1136  BP: 110/80  Pulse: 80  His weight is 234, up 1 pound.The general appearance feels that of a large gentleman in no acute distress.Pupils equal and reactive.   Extraocular Movements are full.  There is no scleral icterus.  The mouth and pharynx are normal.  The neck is supple.  The carotids reveal no bruits.  The jugular venous pressure is normal.  The thyroid is not enlarged.  There is no lymphadenopathy.The chest is clear to percussion and auscultation. There are no rales or rhonchi. Expansion of the chest is symmetrical.The precordium is quiet.  The first heart sound is normal.  The second heart sound is physiologically split.  There is no murmur gallop rub or click.  There is no abnormal lift  or heave. The rhythm is irregular.The abdomen is soft and nontender. Bowel sounds are normal. The liver and spleen are not enlarged. There Are no abdominal masses. There are no bruits.The pedal pulses are good.  There is no phlebitis or edema.  There is no cyanosis or clubbing.Strength is normal and symmetrical in all extremities.  There is no lateralizing weakness.  There are no sensory deficits.   Assessment / Plan:

## 2010-07-17 NOTE — Assessment & Plan Note (Signed)
The patient has been in atrial fibrillation for an unknown length of time.  He was in atrial fibrillation when he presented with his symptoms of multiorgan failure and presumed viral cardiomyopathy on 04/16/10.  He has been on Coumadin.  His INRs have been therapeutic.  He comes in now for pre-cardioversion office visit to plan for cardioversion on Tuesday, April 3 at 1 PM.  He has not had any thromboembolic symptoms.  His INR today is 3.2.

## 2010-07-17 NOTE — Procedures (Signed)
Summary: Oximetry / [facility]  Oximetry / [facility]   Imported By: Lennie Odor 07/07/2010 14:50:23  _____________________________________________________________________  External Attachment:    Type:   Image     Comment:   External Document

## 2010-07-17 NOTE — Assessment & Plan Note (Signed)
The patient has a dilated cardiomyopathy.  His most recent echocardiogram on 06/10/10 showed an ejection fraction of 20-25% with diffuse hypokinesis.  He has not had any acute dyspnea but does have exertional dyspnea and fatigue.  We'll her for that restoration of normal sinus rhythm may improve his left ventricular function and cardiac performance

## 2010-07-18 ENCOUNTER — Telehealth: Payer: Self-pay | Admitting: *Deleted

## 2010-07-18 NOTE — Telephone Encounter (Signed)
Message copied by Regis Bill on Fri Jul 18, 2010  4:56 PM ------      Message from: [name]      Created: Thu Jul 17, 2010  5:24 PM       Blood work is satisfactory for cardioversion.

## 2010-07-18 NOTE — Progress Notes (Signed)
Advised patient of labs 

## 2010-07-18 NOTE — Telephone Encounter (Signed)
Advised of labs 

## 2010-07-21 ENCOUNTER — Telehealth: Payer: Self-pay | Admitting: Cardiology

## 2010-07-21 NOTE — Telephone Encounter (Signed)
SEND ORDERS FOR CARDIOVERSION AND LAST HNP TO SHORT STAY AT [phone]

## 2010-07-21 NOTE — Telephone Encounter (Signed)
Information faxed

## 2010-07-22 ENCOUNTER — Ambulatory Visit (HOSPITAL_COMMUNITY)
Admission: RE | Admit: 2010-07-22 | Discharge: 2010-07-22 | Disposition: A | Discharge status: Home / Self Care | Payer: BC Managed Care – PPO | Source: Ambulatory Visit | Attending: Cardiology | Admitting: Cardiology

## 2010-07-22 DIAGNOSIS — Z7901 Long term (current) use of anticoagulants: Secondary | ICD-10-CM | POA: Insufficient documentation

## 2010-07-22 DIAGNOSIS — I4891 Unspecified atrial fibrillation: Secondary | ICD-10-CM | POA: Insufficient documentation

## 2010-07-24 ENCOUNTER — Encounter (HOSPITAL_BASED_OUTPATIENT_CLINIC_OR_DEPARTMENT_OTHER): Payer: BC Managed Care – PPO

## 2010-07-24 NOTE — Op Note (Signed)
  NAME:  Ruben [name], Ruben [name] NO.:  [id number]  MEDICAL RECORD NO.:  [id number]           PATIENT TYPE:  O  LOCATION:  [facility]                         FACILITY:  [facility]  PHYSICIAN:  [name], M.D. DATE OF BIRTH:  May 23, 1950  DATE OF PROCEDURE:  07/22/2010 DATE OF DISCHARGE:  07/22/2010                              OPERATIVE REPORT   PROCEDURE:  Direct current cardioversion.  HISTORY:  This 60 year old gentleman has had atrial fibrillation for an undetermined period of time.  He has been on therapeutic doses of warfarin for more than 4 weeks.  He returns now to [facility]- Stay for elective cardioversion.  He has a past history of a viral cardiomyopathy with depressed left ventricular systolic function and an ejection fraction of approximately 20-25%.  After being given propofol 110 mg intravenously by Dr. Chaney Malling of Anesthesiology Department, the patient was given a single 120 joules shock using the AP pads.  He converted from atrial fibrillation to normal sinus rhythm.  There were no immediate postanesthetic complications.  The patient tolerated the procedure well.          ______________________________ [name], M.D.     TB/MEDQ  D:  07/22/2010  T:  07/23/2010  Job:  [id number]  Electronically Signed by [name] M.D. on 07/24/2010 12:27:53 PM

## 2010-07-28 ENCOUNTER — Encounter: Payer: Self-pay | Admitting: *Deleted

## 2010-07-29 ENCOUNTER — Encounter: Payer: Self-pay | Admitting: Cardiology

## 2010-07-29 ENCOUNTER — Ambulatory Visit (INDEPENDENT_AMBULATORY_CARE_PROVIDER_SITE_OTHER): Payer: BC Managed Care – PPO | Admitting: Cardiology

## 2010-07-29 VITALS — BP 110/70 | HR 51 | Ht 76.0 in | Wt 235.0 lb

## 2010-07-29 DIAGNOSIS — I428 Other cardiomyopathies: Secondary | ICD-10-CM

## 2010-07-29 DIAGNOSIS — I4891 Unspecified atrial fibrillation: Secondary | ICD-10-CM

## 2010-07-29 NOTE — Progress Notes (Signed)
HPI: This 60 year old gentleman returns for a one-week followup office visit following his cardioversion procedure.  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  His most recent echocardiogram on 06/10/10 showed an ejection fraction in the range of 20-25% with diffuse hypokinesis.  He was in Atrial fibrillation at that time.  He has notBeen expressing any chest pain or angina his not having any complications from his Coumadin.  Current Outpatient Prescriptions  Medication Sig Dispense Refill  . amiodarone (PACERONE) 200 MG tablet Take 200 mg by mouth 2 (two) times daily.        . carvedilol (COREG) 6.25 MG tablet Take 6.25 mg by mouth 2 (two) times daily with a meal.        . hydrALAZINE (APRESOLINE) 25 MG tablet Take 25 mg by mouth 3 (three) times daily.        Marland Kitchen levalbuterol (XOPENEX HFA) 45 MCG/ACT inhaler Inhale 1-2 puffs into the lungs every 4 (four) hours as needed.        Marland Kitchen lisinopril (PRINIVIL,ZESTRIL) 5 MG tablet Take 5 mg by mouth daily.        Marland Kitchen warfarin (COUMADIN) 2 MG tablet Take 2 mg by mouth daily. Taking 2mg .for 3 days and 4mg . Four days or as directed       . DISCONTD: digoxin (LANOXIN) 0.125 MG tablet Take 125 mcg by mouth every other day.          No Known Allergies  Patient Active Problem List  Diagnoses  . CARDIOMYOPATHY  . Atrial fibrillation  . COUGH  . HYPOXEMIA  . A-fib    History  Smoking status  . Never Smoker   Smokeless tobacco  . Not on file    History  Alcohol Use  . Yes    Family History  Problem Relation Age of Onset  . Hypertension Mother     Review of Systems: The patient denies any heat or cold intolerance.  No weight gain or weight loss.  The patient denies headaches or blurry vision.  There is no cough or sputum production.  The patient denies dizziness.  There is no hematuria or hematochezia.  The patient denies any muscle aches or arthritis.  The patient denies any rash.  The patient denies  frequent falling or instability.  There is no history of depression or anxiety.  All other systems were reviewed and are negative.   Physical Exam: Filed Vitals:   07/29/10 1553  BP: 110/70  Pulse: 51  Weight is 235, up 1 pound.Pupils equal and reactive.   Extraocular Movements are full.  There is no scleral icterus.  The mouth and pharynx are normal.  The neck is supple.  The carotids reveal no bruits.  The jugular venous pressure is normal.  The thyroid is not enlarged.  There is no lymphadenopathy.The chest is clear to percussion and auscultation. There are no rales or rhonchi. Expansion of the chest is symmetrical.  Heart reveals no gallop.  Rhythm is regular. There is no murmur.The abdomen is soft and nontender. Bowel sounds are normal. The liver and spleen are not enlarged. There Are no abdominal masses. There are no bruits.The pedal pulses are good.  There is no phlebitis or edema.  There is no cyanosis or clubbing.    Assessment / Plan: We are stopping his digoxin today.  This will allow slightly higher sinus rate.  He is to continue his limited work hours.  He'll return in 2  weeks for protime and in 6 weeks for an office visit and protime and after that we will consider a repeat echocardiogram to look at his updated left ventricular ejection fraction.  The hope is that the ejection fraction given more time will improve further.

## 2010-07-29 NOTE — Assessment & Plan Note (Signed)
The patient underwent successful cardioversion one week ago.  He returns today and his electrocardiogram shows that he is in sinus bradycardia.

## 2010-07-29 NOTE — Assessment & Plan Note (Signed)
Since his cardioversion he feels that his breathing is better and that he is less dyspneic.  Hopefully this will translate into an improved ejection fraction

## 2010-08-11 ENCOUNTER — Encounter: Payer: BC Managed Care – PPO | Admitting: *Deleted

## 2010-08-12 ENCOUNTER — Ambulatory Visit (INDEPENDENT_AMBULATORY_CARE_PROVIDER_SITE_OTHER): Payer: BC Managed Care – PPO | Admitting: *Deleted

## 2010-08-12 DIAGNOSIS — I4891 Unspecified atrial fibrillation: Secondary | ICD-10-CM

## 2010-08-18 NOTE — Assessment & Plan Note (Signed)
Ruben [name] is here regarding his deconditioning and metabolic encephalopathy. He has been discharged from outpatient therapies and is doing some work on his own at home.  He is actually back working 30 hours a week and he has been given positive feedback so far.  He is improving in stamina. He is following up with Dr. Patty Sermons, in regards to his cardiac function.  He does note some problems with the short-term memory, but he had some issues as he says with ADHD prior to the hospitalization.  He is sleeping well.  His mood is improved.  REVIEW OF SYSTEMS:  Notable for the above.  He has had some more problems with cold intolerance, which he attributes to the Coumadin. Shortness of breath and endurance are improving.  SOCIAL HISTORY:  The patient is married, working.  He rarely drinks.  PHYSICAL EXAMINATION:  VITAL SIGNS:  Blood pressure is 111/67, pulse 53, respiratory rate 18, satting 98% on room air. GENERAL:  The patient is pleasant, alert, and oriented x3.  He is a bit talkative and sometime it is difficult to redirect, but really he is doing quite well.  He has good awareness and insight.  Short-term memory is fair to good.  Strengths are 5/5.  Balance is excellent, except for heel-to-toe walking which he tended to lose balance a bit with.  He was able to recover on his own. HEART:  Regular rhythm, but slightly bradycardic. CHEST:  Clear. ABDOMEN:  Soft, nontender.  ASSESSMENT: 1. Metabolic encephalopathy. 2. 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  PLAN: 1. The patient is doing extremely well at this point.  Considering     that he is working 30 hours a week at a fairly high level, I don't     think further cognitive remediation is required.  He would benefit     from keeping a more regular memory book.  If he does seek any     further guidance, we could look at a round of speech therapy, but I     doubt this as necessary as he should further improve back to his     baseline. 2.  Continue exercise per Dr. Yevonne Pax direction.  He might do well     with adding supportive systems and vigorous exercise once he is     cleared from a cardiac standpoint. 3. I will see him back on an as-needed basis.     Ranelle Oyster, M.D. Electronically Signed    ZTS/MedQ D:  07/02/2010 12:43:48  T:  07/02/2010 22:07:51  Job #:  [id number]  cc:   [name], M.D. Fax: [phone]

## 2010-08-21 ENCOUNTER — Ambulatory Visit (HOSPITAL_BASED_OUTPATIENT_CLINIC_OR_DEPARTMENT_OTHER): Payer: BC Managed Care – PPO | Attending: Pulmonary Disease

## 2010-08-21 DIAGNOSIS — I428 Other cardiomyopathies: Secondary | ICD-10-CM | POA: Insufficient documentation

## 2010-08-21 DIAGNOSIS — I10 Essential (primary) hypertension: Secondary | ICD-10-CM | POA: Insufficient documentation

## 2010-08-21 DIAGNOSIS — G4733 Obstructive sleep apnea (adult) (pediatric): Secondary | ICD-10-CM | POA: Insufficient documentation

## 2010-08-21 DIAGNOSIS — I4891 Unspecified atrial fibrillation: Secondary | ICD-10-CM | POA: Insufficient documentation

## 2010-08-21 DIAGNOSIS — Z7901 Long term (current) use of anticoagulants: Secondary | ICD-10-CM | POA: Insufficient documentation

## 2010-08-21 DIAGNOSIS — Z79899 Other long term (current) drug therapy: Secondary | ICD-10-CM | POA: Insufficient documentation

## 2010-08-26 ENCOUNTER — Ambulatory Visit (INDEPENDENT_AMBULATORY_CARE_PROVIDER_SITE_OTHER): Payer: BC Managed Care – PPO | Admitting: *Deleted

## 2010-08-26 DIAGNOSIS — I4891 Unspecified atrial fibrillation: Secondary | ICD-10-CM

## 2010-09-02 DIAGNOSIS — G4733 Obstructive sleep apnea (adult) (pediatric): Secondary | ICD-10-CM

## 2010-09-02 NOTE — Procedures (Addendum)
NAME:  Ruben [name], Ruben [name] NO.:  [id number]  MEDICAL RECORD NO.:  [id number]          PATIENT TYPE:  OUT  LOCATION:  SLEEP CENTER                 FACILITY:  [facility]  PHYSICIAN:  Coralyn Helling, MD        DATE OF BIRTH:  Sep 02, 1950  DATE OF STUDY:  08/21/2010                           NOCTURNAL POLYSOMNOGRAM  REFERRING PHYSICIAN:  Vinnie Gombert  INDICATION FOR STUDY:  [name] is a 60 year old male who has a history of nonischemic cardiomyopathy, atrial fibrillation, hypertension.  He also has symptoms of snoring, sleep disruption, and daytime sleepiness.  He had undergone an overnight oximetry as an outpatient, was found to have significant oxygen desaturation.  As a result, he was referred to the Sleep Lab for evaluation of hypersomnia with obstructive sleep apnea.  Height is 64 inches, weight is 235 pounds.  BMI is 29.  Neck size is 16 inches.  EPWORTH SLEEPINESS SCORE:  11.  MEDICATIONS:  Warfarin, hydralazine, lisinopril, Pacerone, and carvedilol.  SLEEP ARCHITECTURE:  Total recording time was 429 minutes.  Total sleep time was 313 minutes.  Sleep efficiency was 73%.  Sleep latency was 36 minutes.  REM latency was 120 minutes.  The patient was observed in all stages of sleep and slept in both supine and non-supine position.  RESPIRATORY DATA:  The average respiratory rate was 20.  Mild snoring was noted by the technician.  The overall apnea/hypopnea index was 12.5. The respiratory disturbance index was 24.9.  There were 13 central apneic events.  The remainder of the events were obstructive in nature. The REM apnea/hypopnea index was 7.2 versus a non-REM apnea/hypopnea index of 14.6.  The supine apnea/hypopnea index was 13.4 versus a non- supine apnea/hypopnea index of 4.4.  OXYGEN DATA:  The baseline oxygenation was 97%.  The oxygen saturation nadir was 89%.  The study was conducted without the use of supplemental oxygen.  CARDIAC DATA:  The average  heart rate was 50 and the rhythm strip showed normal sinus rhythm with occasional PVCs.  MOVEMENT-PARASOMNIA:  The patient had 1 restroom trip and the periodic limb movement index was 2.7.  IMPRESSIONS-RECOMMENDATIONS:  This study shows evidence for mild-to- moderate obstructive sleep apnea.  He did have a significant positional component to sleep disordered breathing.  In addition to diet, exercise, and weight reduction, additional therapeutic options could include CPAP therapy, oral appliance, or surgical intervention.     Coralyn Helling, MD Diplomat, American Board of Sleep Medicine Electronically Signed    VS/MEDQ  D:  09/02/2010 10:55:01  T:  09/02/2010 23:34:10  Job:  [id number]

## 2010-09-04 ENCOUNTER — Telehealth: Payer: Self-pay | Admitting: Pulmonary Disease

## 2010-09-04 DIAGNOSIS — G4733 Obstructive sleep apnea (adult) (pediatric): Secondary | ICD-10-CM | POA: Insufficient documentation

## 2010-09-04 NOTE — Telephone Encounter (Signed)
lmomtcb x1 

## 2010-09-04 NOTE — Telephone Encounter (Signed)
Pt called back. He is scheduled to f/u w/ vs 6/5 (pt out of town before that date). Ruben [name]

## 2010-09-04 NOTE — Telephone Encounter (Signed)
PSG 08/21/10>>AHI 12.5, RDI 24.9, SpO2 low 89%.  Will have my nurse schedule ROV to review sleep study.

## 2010-09-10 ENCOUNTER — Encounter: Payer: Self-pay | Admitting: Cardiology

## 2010-09-10 ENCOUNTER — Ambulatory Visit (INDEPENDENT_AMBULATORY_CARE_PROVIDER_SITE_OTHER): Payer: BC Managed Care – PPO | Admitting: Cardiology

## 2010-09-10 ENCOUNTER — Ambulatory Visit (INDEPENDENT_AMBULATORY_CARE_PROVIDER_SITE_OTHER): Payer: BC Managed Care – PPO | Admitting: *Deleted

## 2010-09-10 VITALS — BP 118/78 | HR 49 | Wt 234.0 lb

## 2010-09-10 DIAGNOSIS — Z79899 Other long term (current) drug therapy: Secondary | ICD-10-CM

## 2010-09-10 DIAGNOSIS — I4891 Unspecified atrial fibrillation: Secondary | ICD-10-CM

## 2010-09-10 DIAGNOSIS — I517 Cardiomegaly: Secondary | ICD-10-CM

## 2010-09-10 DIAGNOSIS — I428 Other cardiomyopathies: Secondary | ICD-10-CM

## 2010-09-10 MED ORDER — AMIODARONE HCL 200 MG PO TABS: 200.0000 mg | ORAL_TABLET | Freq: Every day | ORAL | Status: DC

## 2010-09-10 NOTE — Progress Notes (Signed)
Ruben [name] Date of Birth:  10-22-1950 [facility] [address]   [address] [place], [place]  [address] [phone]           Fax   [phone]  History of Present Illness: This pleasant 60 year old gentleman is seen for a scheduled followup office visit.  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  His initial ejection fraction was in the range of 10-15% with marked global hypokinesis when he was admitted with atrial fibrillation of uncertain duration and with acute renal failure and acute liver failure and required prolonged intubation on the ventilator.  He gradually improved.  He did not suffer any neurologic sequelae from his prolonged intensive care stay.  His most recent echocardiogram on 06/10/10 when he was still in atrial fibrillation showed an ejection fraction in the range of 20-25% with diffuse hypokinesis.  The patient has not yet returned to full-time work.  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  Current Outpatient Prescriptions  Medication Sig Dispense Refill  . carvedilol (COREG) 6.25 MG tablet Take 6.25 mg by mouth 2 (two) times daily with a meal.        . hydrALAZINE (APRESOLINE) 25 MG tablet Take 25 mg by mouth 3 (three) times daily. Taking 1/2 q 8hrs      . levalbuterol (XOPENEX HFA) 45 MCG/ACT inhaler Inhale 1-2 puffs into the lungs every 4 (four) hours as needed.        Marland Kitchen lisinopril (PRINIVIL,ZESTRIL) 5 MG tablet Take 5 mg by mouth daily.        Marland Kitchen warfarin (COUMADIN) 2 MG tablet Take 2 mg by mouth daily. Taking 2mg .for 3 days and 4mg . Four days or as directed       . DISCONTD: amiodarone (PACERONE) 200 MG tablet Take 200 mg by mouth 2 (two) times daily.        Marland Kitchen amiodarone (PACERONE) 200 MG tablet Take 1 tablet (200 mg total) by mouth daily.  30 tablet  11    No Known Allergies  Patient Active Problem List  Diagnoses  . CARDIOMYOPATHY  . Atrial fibrillation  . COUGH  . OSA (obstructive sleep  apnea)    History  Smoking status  . Never Smoker   Smokeless tobacco  . Not on file    History  Alcohol Use  . Yes    Family History  Problem Relation Age of Onset  . Hypertension Mother     Review of Systems: Constitutional: no fever chills diaphoresis or fatigue or change in weight.  Head and neck: no hearing loss, no epistaxis, no photophobia or visual disturbance. Respiratory: No cough, shortness of breath or wheezing. Cardiovascular: No chest pain peripheral edema, palpitations. Gastrointestinal: No abdominal distention, no abdominal pain, no change in bowel habits hematochezia or melena. Genitourinary: No dysuria, no frequency, no urgency, no nocturia. Musculoskeletal:No arthralgias, no back pain, no gait disturbance or myalgias. Neurological: No dizziness, no headaches, no numbness, no seizures, no syncope, no weakness, no tremors. Hematologic: No lymphadenopathy, no easy bruising. Psychiatric: No confusion, no hallucinations, no sleep disturbance.    Physical Exam: Filed Vitals:   09/10/10 1335  BP: 118/78  Pulse: 49  The general appearance reveals an alert gentleman in no distress.Pupils equal and reactive.   Extraocular Movements are full.  There is no scleral icterus.  The mouth and pharynx are normal.  The neck is supple.  The carotids reveal no bruits.  The  jugular venous pressure is normal.  The thyroid is not enlarged.  There is no lymphadenopathy.The chest is clear to percussion and auscultation. There are no rales or rhonchi. Expansion of the chest is symmetrical.The precordium is quiet.  The first heart sound is normal.  The second heart sound is physiologically split.  There is no murmur gallop rub or click.  There is no abnormal lift or heave.  The rate is slow and regular.The abdomen is soft and nontender. Bowel sounds are normal. The liver and spleen are not enlarged. There Are no abdominal masses. There are no bruits.  Normal extremity without phlebitis  or edema.Strength is normal and symmetrical in all extremities.  There is no lateralizing weakness.  There are no sensory deficits.  EKG shows sinus bradycardia and no ischemic changes.   Assessment / Plan: The patient is to continue same medication.  His INR is 2.4.  We are scheduling a two-dimensional echocardiogram to see if there has been further improvement in his left ventricular systolic function.  Recheck inOne month for protime and in 2 months for office visit and protime.   he will continue his current Medication

## 2010-09-10 NOTE — Assessment & Plan Note (Signed)
His rhythm remains normal since the cardioversion.  We will now plan to get an echocardiogram to see if his left ventricular function has improved any further.

## 2010-09-10 NOTE — Assessment & Plan Note (Signed)
The patient has been feeling well since last visit.  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  He underwent successful cardioversion on 07/22/10.  His electrocardiogram today shows that he is maintaining sinus rhythm.  EKG shows sinus bradycardia at 49 beats per minute.  The patient is not having any dizzy spells or syncope.  He denies any exertional chest pain and his energy level and exertional capacity have improved since cardioversion.

## 2010-09-10 NOTE — Patient Instructions (Signed)
We will schedure 2d echo

## 2010-09-11 ENCOUNTER — Encounter: Payer: Self-pay | Admitting: Pulmonary Disease

## 2010-09-11 ENCOUNTER — Ambulatory Visit (HOSPITAL_COMMUNITY): Payer: BC Managed Care – PPO | Attending: Internal Medicine | Admitting: Radiology

## 2010-09-11 DIAGNOSIS — I4891 Unspecified atrial fibrillation: Secondary | ICD-10-CM

## 2010-09-11 DIAGNOSIS — I059 Rheumatic mitral valve disease, unspecified: Secondary | ICD-10-CM | POA: Insufficient documentation

## 2010-09-11 DIAGNOSIS — I079 Rheumatic tricuspid valve disease, unspecified: Secondary | ICD-10-CM | POA: Insufficient documentation

## 2010-09-11 DIAGNOSIS — I428 Other cardiomyopathies: Secondary | ICD-10-CM | POA: Insufficient documentation

## 2010-09-11 DIAGNOSIS — I379 Nonrheumatic pulmonary valve disorder, unspecified: Secondary | ICD-10-CM | POA: Insufficient documentation

## 2010-09-12 ENCOUNTER — Telehealth: Payer: Self-pay | Admitting: Cardiology

## 2010-09-12 NOTE — Telephone Encounter (Signed)
Telephone call to Patient.  I gave him the report of his echo which was excellent.  His ejection fraction is now 50-55%.  We will plan to have him to return to work full-time on Monday, June 4

## 2010-09-16 NOTE — Progress Notes (Signed)
Chart pulled °

## 2010-09-18 ENCOUNTER — Encounter: Payer: Self-pay | Admitting: Pulmonary Disease

## 2010-09-23 ENCOUNTER — Ambulatory Visit (INDEPENDENT_AMBULATORY_CARE_PROVIDER_SITE_OTHER): Payer: BC Managed Care – PPO | Admitting: Pulmonary Disease

## 2010-09-23 ENCOUNTER — Encounter: Payer: Self-pay | Admitting: Pulmonary Disease

## 2010-09-23 DIAGNOSIS — R059 Cough, unspecified: Secondary | ICD-10-CM

## 2010-09-23 DIAGNOSIS — G4733 Obstructive sleep apnea (adult) (pediatric): Secondary | ICD-10-CM

## 2010-09-23 DIAGNOSIS — R05 Cough: Secondary | ICD-10-CM

## 2010-09-23 DIAGNOSIS — I428 Other cardiomyopathies: Secondary | ICD-10-CM

## 2010-09-23 NOTE — Patient Instructions (Signed)
Talk to your dentist about whether you would be a candidate for an oral appliance (mandibular advancement device) for sleep apnea Alternative option is CPAP therapy for sleep apnea Call once you make a decision Follow up in 4 months

## 2010-09-23 NOTE — Assessment & Plan Note (Signed)
Followed by Dr. Patty Sermons.

## 2010-09-23 NOTE — Progress Notes (Signed)
Subjective:    Patient ID: Ruben [name], male    DOB: Jan 02, 1951, 60 y.o.   MRN: [id number]  HPI CC: Ruben [name]  60 yo male with OSA, cough with borderline obstruction on PFT, and non-ischemic cardiomyopathy.  He had recent Echo and was told his EF increased to 50%.  He also had successful cardioverion.  He had PSG 08/21/10>>AHI 12.5, RDI 24.9, SpO2 low 89%.  He is not having much cough.  Past Medical History  Diagnosis Date  . Encephalopathy   . CHF (congestive heart failure)     cardiomyopathy  . Arrhythmia     afib  . Cardiomyopathy   . OSA (obstructive sleep apnea)   . Cough      Family History  Problem Relation Age of Onset  . Hypertension Mother      History   Social History  . Marital Status: Married    Spouse Name: N/A    Number of Children: N/A  . Years of Education: N/A   Occupational History  . Not on file.   Social History Main Topics  . Smoking status: Never Smoker   . Smokeless tobacco: Not on file  . Alcohol Use: Yes  . Drug Use: No  . Sexually Active: Not on file   Other Topics Concern  . Not on file   Social History Narrative  . No narrative on file     No Known Allergies   Outpatient Prescriptions Prior to Visit  Medication Sig Dispense Refill  . amiodarone (PACERONE) 200 MG tablet Take 1 tablet (200 mg total) by mouth daily.  30 tablet  11  . carvedilol (COREG) 6.25 MG tablet Take 6.25 mg by mouth 2 (two) times daily with a meal.        . hydrALAZINE (APRESOLINE) 25 MG tablet Take 25 mg by mouth 3 (three) times daily. Taking 1/2 q 8hrs      . levalbuterol (XOPENEX HFA) 45 MCG/ACT inhaler Inhale 1-2 puffs into the lungs every 4 (four) hours as needed.        Marland Kitchen lisinopril (PRINIVIL,ZESTRIL) 5 MG tablet Take 5 mg by mouth daily.        Marland Kitchen warfarin (COUMADIN) 2 MG tablet Take 2 mg by mouth daily. Taking 2mg .for 4 days and 4mg  3 days or as directed        Review of Systems     Objective:   Physical Exam  BP 108/72   Pulse 57  Temp(Src) 97.8 F (36.6 C) (Oral)  Ht 6\' 4"  (1.93 m)  Wt 242 lb (109.77 kg)  BMI 29.46 kg/m2  SpO2 94%  General: healthy appearing and thin.  Nose: no deformity, discharge, inflammation, or lesions  Mouth: MP3, 2+ tonsills, elongated uvula  Neck: no JVD.  Lungs: clear bilaterally to auscultation and percussion  Heart: regular rhythm, normal rate, and no murmurs.  Extremities: minimal ankle edema, no clubbing/cyanosis  Neurologic: normal CN II-XII.  Cervical Nodes: no significant adenopathy     Assessment & Plan:   Cough He is not having much cough at present.  OSA (obstructive sleep apnea) He has mild to moderate sleep apnea.  I have reviewed his sleep test results with the patient.  Explained how sleep apnea can affect the patient's health.  Driving precautions and importance of weight loss were discussed.  Treatment options for sleep apnea were reviewed.  He would like to discuss his options with his wife.  He will check with his dentist about whether he  is a candidate for an oral appliance.  He will notify me of his decision.  CARDIOMYOPATHY Followed by Dr. Patty Sermons.    Updated Medication List Outpatient Encounter Prescriptions as of 09/23/2010  Medication Sig Dispense Refill  . amiodarone (PACERONE) 200 MG tablet Take 1 tablet (200 mg total) by mouth daily.  30 tablet  11  . carvedilol (COREG) 6.25 MG tablet Take 6.25 mg by mouth 2 (two) times daily with a meal.        . hydrALAZINE (APRESOLINE) 25 MG tablet Take 25 mg by mouth 3 (three) times daily. Taking 1/2 q 8hrs      . levalbuterol (XOPENEX HFA) 45 MCG/ACT inhaler Inhale 1-2 puffs into the lungs every 4 (four) hours as needed.        Marland Kitchen lisinopril (PRINIVIL,ZESTRIL) 5 MG tablet Take 5 mg by mouth daily.        Marland Kitchen warfarin (COUMADIN) 2 MG tablet Take 2 mg by mouth daily. Taking 2mg .for 4 days and 4mg  3 days or as directed

## 2010-09-23 NOTE — Assessment & Plan Note (Signed)
He is not having much cough at present.

## 2010-09-23 NOTE — Assessment & Plan Note (Signed)
He has mild to moderate sleep apnea.  I have reviewed his sleep test results with the patient.  Explained how sleep apnea can affect the patient's health.  Driving precautions and importance of weight loss were discussed.  Treatment options for sleep apnea were reviewed.  He would like to discuss his options with his wife.  He will check with his dentist about whether he is a candidate for an oral appliance.  He will notify me of his decision.

## 2010-09-30 ENCOUNTER — Telehealth: Payer: Self-pay | Admitting: Pulmonary Disease

## 2010-09-30 DIAGNOSIS — G4733 Obstructive sleep apnea (adult) (pediatric): Secondary | ICD-10-CM

## 2010-09-30 NOTE — Telephone Encounter (Signed)
Please advise thanks.

## 2010-10-03 NOTE — Telephone Encounter (Signed)
Pt returned call. Says "yes" he would like referral w/ dr Myrtis Ser set up. Call pt on cell (mobile) #. Hazel Sams

## 2010-10-03 NOTE — Telephone Encounter (Signed)
Left message on voicemail.    Advised that our office could send referral to Dr. Althea Grimmer to assess for oral appliance to treat his sleep apnea.    Advised him to call back and speak with nurse if he would like this referral placed.

## 2010-10-06 ENCOUNTER — Telehealth: Payer: Self-pay | Admitting: Cardiology

## 2010-10-06 NOTE — Telephone Encounter (Signed)
I will send request to [facility] to arrange for eval with Dr. Myrtis Ser for oral appliance.  Please inform pt of plan.

## 2010-10-06 NOTE — Telephone Encounter (Signed)
Called spoke with patient, advised of VS's answer / plan.  Pt verbalized his understanding.  Will sign off on message.

## 2010-10-06 NOTE — Telephone Encounter (Signed)
PT SAID RELEASED BY DR BB TO GO BACK TO WORK FULLTIME BUT BOSS IS ASKING FOR SOMETHING IN WRITING ON OFFICE LETTERHEAD AND MORE FORMAL. PT AWARE THAT DR BB IS NOT IN THIS WEEK AND NEXT WEEK WILL BE FINE. ASKING COULD THE LTR POSSIBLY BE DONE BY LATE AFTERNOON ON Monday UPON DR BB'S RETURN TO THE OFFICE.

## 2010-10-09 ENCOUNTER — Encounter: Payer: Self-pay | Admitting: *Deleted

## 2010-10-10 ENCOUNTER — Ambulatory Visit (INDEPENDENT_AMBULATORY_CARE_PROVIDER_SITE_OTHER): Payer: BC Managed Care – PPO | Admitting: *Deleted

## 2010-10-10 ENCOUNTER — Encounter: Payer: BC Managed Care – PPO | Admitting: *Deleted

## 2010-10-10 ENCOUNTER — Encounter: Payer: Self-pay | Admitting: *Deleted

## 2010-10-10 DIAGNOSIS — I4891 Unspecified atrial fibrillation: Secondary | ICD-10-CM

## 2010-10-10 LAB — POCT INR: INR: 2.3

## 2010-10-10 NOTE — Telephone Encounter (Signed)
Letter done, advised patient ok to pick up Monday afternoon.

## 2010-10-16 ENCOUNTER — Telehealth: Payer: Self-pay | Admitting: Pulmonary Disease

## 2010-10-16 NOTE — Telephone Encounter (Signed)
Called Dr. Myrtis Ser office and spoke with Annice Pih, Dr. Myrtis Ser is on vacation and [facility] will not be able to call pt before Monday. I relayed the message to the pt. Pt verbalized understanding.

## 2010-10-21 ENCOUNTER — Telehealth: Payer: Self-pay | Admitting: Pulmonary Disease

## 2010-10-21 NOTE — Telephone Encounter (Signed)
Noted and will forward to VS as FYI

## 2010-11-07 ENCOUNTER — Ambulatory Visit (INDEPENDENT_AMBULATORY_CARE_PROVIDER_SITE_OTHER): Payer: BC Managed Care – PPO | Admitting: *Deleted

## 2010-11-07 DIAGNOSIS — I4891 Unspecified atrial fibrillation: Secondary | ICD-10-CM

## 2010-11-17 ENCOUNTER — Other Ambulatory Visit (INDEPENDENT_AMBULATORY_CARE_PROVIDER_SITE_OTHER): Payer: BC Managed Care – PPO | Admitting: *Deleted

## 2010-11-17 ENCOUNTER — Ambulatory Visit (INDEPENDENT_AMBULATORY_CARE_PROVIDER_SITE_OTHER): Payer: BC Managed Care – PPO | Admitting: Cardiology

## 2010-11-17 ENCOUNTER — Telehealth: Payer: Self-pay | Admitting: Cardiology

## 2010-11-17 ENCOUNTER — Encounter: Payer: Self-pay | Admitting: Cardiology

## 2010-11-17 DIAGNOSIS — I517 Cardiomegaly: Secondary | ICD-10-CM

## 2010-11-17 DIAGNOSIS — Z79899 Other long term (current) drug therapy: Secondary | ICD-10-CM

## 2010-11-17 DIAGNOSIS — E7800 Pure hypercholesterolemia, unspecified: Secondary | ICD-10-CM

## 2010-11-17 DIAGNOSIS — I428 Other cardiomyopathies: Secondary | ICD-10-CM

## 2010-11-17 DIAGNOSIS — I4891 Unspecified atrial fibrillation: Secondary | ICD-10-CM

## 2010-11-17 HISTORY — DX: Pure hypercholesterolemia, unspecified: E78.00

## 2010-11-17 LAB — HEPATIC FUNCTION PANEL
ALT: 42 U/L (ref 0–53)
AST: 28 U/L (ref 0–37)
Albumin: 4 g/dL (ref 3.5–5.2)

## 2010-11-17 LAB — BASIC METABOLIC PANEL
Calcium: 8.9 mg/dL (ref 8.4–10.5)
GFR: 50.01 mL/min — ABNORMAL LOW (ref >60.00)
Potassium: 4 mEq/L (ref 3.5–5.1)
Sodium: 142 mEq/L (ref 135–145)

## 2010-11-17 LAB — CBC WITH DIFFERENTIAL/PLATELET
Basophils Absolute: 0 10*3/uL (ref 0.0–0.1)
Basophils Relative: 0.6 % (ref 0.0–3.0)
Eosinophils Relative: 2.1 % (ref 0.0–5.0)
Hemoglobin: 12.9 g/dL — ABNORMAL LOW (ref 13.0–17.0)
Lymphocytes Relative: 18.1 % (ref 12.0–46.0)
Monocytes Relative: 9.6 % (ref 3.0–12.0)
Neutro Abs: 4.1 10*3/uL (ref 1.4–7.7)
RBC: 3.95 Mil/uL — ABNORMAL LOW (ref 4.22–5.81)
WBC: 5.9 10*3/uL (ref 4.5–10.5)

## 2010-11-17 MED ORDER — AMIODARONE HCL 200 MG PO TABS: 100.0000 mg | ORAL_TABLET | Freq: Every day | ORAL | Status: DC

## 2010-11-17 NOTE — Assessment & Plan Note (Addendum)
The patient has a past history of a dilated idiopathic cardiomyopathy.  He presented in rapid atrial fibrillation and severe congestive heart failure with multiorgan failure and shock and respiratory insufficiency.  After a prolonged hospital course he was able to be discharged initially to the inpatient rehabilitation service and subsequently home.  He has been maintained on Coumadin.  He underwent successful cardioversionIn may of 2012 and he has maintained normal sinus rhythm on amiodarone.  His most recent echocardiogram showed improvement in his ejection fraction from the initial range of 20-25% To the presentEstimated ejection fraction of 55% by followup echocardiogram.  The patient has not been experiencing any orthopnea or paroxysmal nocturnal dyspnea.  He has not been getting much walking exercise because of the hot weather.  He has gained some weight since we last saw him.  His primary care physician noted that his cholesterol was elevated at 266 and the patient is now on pravastatin

## 2010-11-17 NOTE — Progress Notes (Signed)
Ruben [name] Date of Birth:  [birth year] [facility] [address]   [address] [place], [place]  [address] [phone]           Fax   [phone]  History of Present Illness: This pleasant 60 year old gentleman is seen for a scheduled followup office visit.  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  This has improved from an ejection fraction of 25% now to an ejection fraction of 50-55% the patient had successful cardioversion of atrial fibrillation in May 2010 and has remained in normal sinus rhythm subsequently.  He continues to have a sinus bradycardia and is on amiodarone and carvedilol.He has not been expressing any dizziness or syncope.  He's had no palpitations.  He's had no chest pain.  He has not been getting much regular exercise.  He does have hypercholesterolemia and is on pravastatin from his primary care provider.  Current Outpatient Prescriptions  Medication Sig Dispense Refill  . amiodarone (PACERONE) 200 MG tablet Take 0.5 tablets (100 mg total) by mouth daily.  30 tablet  11  . carvedilol (COREG) 6.25 MG tablet Take 6.25 mg by mouth 2 (two) times daily with a meal.        . hydrALAZINE (APRESOLINE) 25 MG tablet Take 25 mg by mouth 3 (three) times daily. Taking 1/2 q 8hrs      . levalbuterol (XOPENEX HFA) 45 MCG/ACT inhaler Inhale 1-2 puffs into the lungs every 4 (four) hours as needed.        Marland Kitchen lisinopril (PRINIVIL,ZESTRIL) 5 MG tablet Take 5 mg by mouth daily.        . pravastatin (PRAVACHOL) 20 MG tablet Per PCP Take 1 tablet daily      . warfarin (COUMADIN) 2 MG tablet Take 2 mg by mouth daily. Taking 2mg .for 4 days and 4mg  3 days or as directed        No Known Allergies  Patient Active Problem List  Diagnoses  . CARDIOMYOPATHY  . Atrial fibrillation  . Cough  . OSA (obstructive sleep apnea)  . Pure hypercholesterolemia    History  Smoking status  . Never Smoker     Smokeless tobacco  . Not on file    History  Alcohol Use  . Yes    Family History  Problem Relation Age of Onset  . Hypertension Mother     Review of Systems: Constitutional: no fever chills diaphoresis or fatigue or change in weight.  Head and neck: no hearing loss, no epistaxis, no photophobia or visual disturbance. Respiratory: No cough, shortness of breath or wheezing. Cardiovascular: No chest pain peripheral edema, palpitations. Gastrointestinal: No abdominal distention, no abdominal pain, no change in bowel habits hematochezia or melena. Genitourinary: No dysuria, no frequency, no urgency, no nocturia. Musculoskeletal:No arthralgias, no back pain, no gait disturbance or myalgias. Neurological: No dizziness, no headaches, no numbness, no seizures, no syncope, no weakness, no tremors. Hematologic: No lymphadenopathy, no easy bruising. Psychiatric: No confusion, no hallucinations, no sleep disturbance.    Physical Exam: Filed Vitals:   11/17/10 0904  BP: 106/70  Pulse: 48  The general appearance reveals a well-developed well-nourished gentleman in no distress.The head and neck exam reveals pupils equal and reactive.  Extraocular movements are full.  There is no scleral icterus.  The mouth and pharynx are normal.  The neck is supple.  The carotids reveal no bruits.  The jugular venous pressure is normal.  The  thyroid is not enlarged.  There is no lymphadenopathy.  The chest is clear to percussion and auscultation.  There are no rales or rhonchi.  Expansion of the chest is symmetrical.  The precordium is quiet.  The first heart sound is normal.  The second heart sound is physiologically split.  There is no murmur gallop rub or click.  There is no abnormal lift or heave.  The abdomen is soft and nontender.  The bowel sounds are normal.  The liver and spleen are not enlarged.  There are no abdominal masses.  There are no abdominal bruits.  Extremities reveal good pedal pulses.   There is no phlebitis or edema.  There is no cyanosis or clubbing.  Strength is normal and symmetrical in all extremities.  There is no lateralizing weakness.  There are no sensory deficits.  The skin is warm and dry.  There is no rash.     Assessment / Plan: Continue present medication except decrease amiodarone to just 100 mg daily.  Recheck in 4 months for followup office visit and EKG

## 2010-11-17 NOTE — Assessment & Plan Note (Signed)
Patient is on pravastatin and tolerating it without side effects

## 2010-11-17 NOTE — Telephone Encounter (Signed)
Pt seen today, pravastatin presrcibed by pcp, pt thought was it was a 10mg  however it is 20mg , pt wanted to pass along message

## 2010-11-18 ENCOUNTER — Telehealth: Payer: Self-pay | Admitting: Cardiology

## 2010-11-18 NOTE — Telephone Encounter (Signed)
Pt calling back regarding no heavy lifting restrictions that were in place and wanted to make sure if that still remains in place or if he can lift now. He just needs a call back as to what Dr Patty Sermons recommends. He is aware that Dr Patty Sermons is out of the office today. Note to be sent to Dr Patty Sermons.

## 2010-11-18 NOTE — Telephone Encounter (Signed)
Received call from patient, stated he had a couple of questions that he forgot to ask.  Please call

## 2010-11-18 NOTE — Telephone Encounter (Signed)
Left message and call back number

## 2010-11-18 NOTE — Telephone Encounter (Signed)
He can start to do some heavy lifting now--up to fifty pounds

## 2010-11-19 NOTE — Telephone Encounter (Signed)
Pt advised per Dr Yevonne Pax recommendation he could start doing some heavy lifting--up to 50 pounds. He will stop by office to pick up the note.

## 2010-11-24 ENCOUNTER — Telehealth: Payer: Self-pay | Admitting: *Deleted

## 2010-11-24 NOTE — Telephone Encounter (Signed)
Advised patient of labs recorder 

## 2010-11-24 NOTE — Telephone Encounter (Signed)
Message copied by Eugenia Pancoast on Mon Nov 24, 2010  3:33 PM ------      Message from: [name]      Created: Mon Nov 17, 2010  4:55 PM       Please report.The liver function tests are normal.The kidney function studies are normal.The thyroid test is normal and the CBC is normal.  Continue same medication

## 2010-12-05 ENCOUNTER — Ambulatory Visit (INDEPENDENT_AMBULATORY_CARE_PROVIDER_SITE_OTHER): Payer: BC Managed Care – PPO | Admitting: *Deleted

## 2010-12-05 DIAGNOSIS — I4891 Unspecified atrial fibrillation: Secondary | ICD-10-CM

## 2010-12-05 LAB — POCT INR: INR: 2.1

## 2010-12-11 ENCOUNTER — Encounter: Payer: Self-pay | Admitting: *Deleted

## 2010-12-11 ENCOUNTER — Telehealth: Payer: Self-pay | Admitting: Cardiology

## 2010-12-11 NOTE — Telephone Encounter (Signed)
Needs a letterhead note releasing full time work, able to travel with traveling. and only restriciton is lifting 50 lbs or more if still applies.

## 2010-12-11 NOTE — Telephone Encounter (Signed)
Pt's boss would like a letter from Dr. Patty Sermons on a letterhead for job limitations/restrictions.  Please call pt back for details.

## 2010-12-11 NOTE — Telephone Encounter (Signed)
Letter done

## 2010-12-29 ENCOUNTER — Telehealth: Payer: Self-pay | Admitting: Cardiology

## 2010-12-29 NOTE — Telephone Encounter (Signed)
Dentist office is calling to see if Dr. Patty Sermons wants patient to be premedicated with antibiotics for a broken tooth with cleaning scheduled at 2pm today.  Please call dentist office back to instruct.

## 2010-12-29 NOTE — Telephone Encounter (Signed)
The patient does not require SBE prophylaxis

## 2010-12-29 NOTE — Telephone Encounter (Signed)
?  pre med prior to dental procedure

## 2010-12-29 NOTE — Telephone Encounter (Signed)
Ruben [name] no pre med necessary.

## 2010-12-31 ENCOUNTER — Telehealth: Payer: Self-pay | Admitting: Cardiology

## 2010-12-31 NOTE — Telephone Encounter (Signed)
Any reason why he can not have?

## 2010-12-31 NOTE — Telephone Encounter (Signed)
Agree with plan 

## 2010-12-31 NOTE — Telephone Encounter (Signed)
Pt calling to make sure ok he have a root canal, appt today at 130p

## 2010-12-31 NOTE — Telephone Encounter (Signed)
Spoke with patient and he is on coumadin.  Last INR was 2.1 and he did advised oral surgeon/dentist doing root canal. Advised ok to have

## 2011-01-02 ENCOUNTER — Ambulatory Visit (INDEPENDENT_AMBULATORY_CARE_PROVIDER_SITE_OTHER): Payer: BC Managed Care – PPO | Admitting: *Deleted

## 2011-01-02 DIAGNOSIS — I4891 Unspecified atrial fibrillation: Secondary | ICD-10-CM

## 2011-01-07 ENCOUNTER — Telehealth: Payer: Self-pay | Admitting: Cardiology

## 2011-01-07 NOTE — Telephone Encounter (Signed)
Advised yes.

## 2011-01-07 NOTE — Telephone Encounter (Signed)
Called wanting to know if he could get a flu shot this year. Please call back.

## 2011-01-29 ENCOUNTER — Ambulatory Visit (INDEPENDENT_AMBULATORY_CARE_PROVIDER_SITE_OTHER): Payer: BC Managed Care – PPO | Admitting: *Deleted

## 2011-01-29 DIAGNOSIS — I4891 Unspecified atrial fibrillation: Secondary | ICD-10-CM

## 2011-02-05 ENCOUNTER — Ambulatory Visit (INDEPENDENT_AMBULATORY_CARE_PROVIDER_SITE_OTHER): Payer: BC Managed Care – PPO | Admitting: Pulmonary Disease

## 2011-02-05 ENCOUNTER — Encounter: Payer: Self-pay | Admitting: Pulmonary Disease

## 2011-02-05 DIAGNOSIS — R059 Cough, unspecified: Secondary | ICD-10-CM

## 2011-02-05 DIAGNOSIS — G4733 Obstructive sleep apnea (adult) (pediatric): Secondary | ICD-10-CM

## 2011-02-05 DIAGNOSIS — R05 Cough: Secondary | ICD-10-CM

## 2011-02-05 NOTE — Assessment & Plan Note (Signed)
He is to continue as needed xopenex.

## 2011-02-05 NOTE — Patient Instructions (Signed)
Follow up in 6 months 

## 2011-02-05 NOTE — Assessment & Plan Note (Signed)
He needs to complete his dental work before he can get fitted for an oral appliance.

## 2011-02-05 NOTE — Progress Notes (Signed)
Subjective:    Patient ID: Ruben [name], male    DOB: 1950-11-09, 60 y.o.   MRN: [id number]  HPI CC: Ruben [name]   60 yo male with OSA, cough with borderline obstruction on PFT, and non-ischemic cardiomyopathy.  He has not been fitted for his oral appliance yet.  He is planning on getting this done after December.  He has occasional cough and uses his xopenex about 4 or 5 times per week.  This helps.    Past Medical History  Diagnosis Date  . Non-ischemic cardiomyopathy        . Atrial fibrillation   . OSA (obstructive sleep apnea)   . Cough      Family History  Problem Relation Age of Onset  . Hypertension Mother      History   Social History  . Marital Status: Married   Social History Main Topics  . Smoking status: Never Smoker   . Alcohol Use: Yes  . Drug Use: No   No Known Allergies   Outpatient Prescriptions Prior to Visit  Medication Sig Dispense Refill  . amiodarone (PACERONE) 200 MG tablet Take 0.5 tablets (100 mg total) by mouth daily.  30 tablet  11  . carvedilol (COREG) 6.25 MG tablet Take 6.25 mg by mouth 2 (two) times daily with a meal.        . hydrALAZINE (APRESOLINE) 25 MG tablet Take 25 mg by mouth 3 (three) times daily. Taking 1/2 q 8hrs      . levalbuterol (XOPENEX HFA) 45 MCG/ACT inhaler Inhale 1-2 puffs into the lungs every 4 (four) hours as needed.        Marland Kitchen lisinopril (PRINIVIL,ZESTRIL) 5 MG tablet Take 5 mg by mouth daily.        . pravastatin (PRAVACHOL) 20 MG tablet Per PCP Take 1 tablet daily      . warfarin (COUMADIN) 2 MG tablet Take 2 mg by mouth daily. Taking 2mg .for 4 days and 4mg  3 days or as directed       Review of Systems     Objective:   Physical Exam  BP 106/64  Pulse 53  Temp(Src) 97.8 F (36.6 C) (Oral)  Ht 6\' 4"  (1.93 m)  Wt 254 lb 12.8 oz (115.577 kg)  BMI 31.02 kg/m2  SpO2 98%  General: healthy appearing and thin.  Nose: no deformity, discharge, inflammation, or lesions  Mouth: MP3, 2+ tonsills,  elongated uvula  Neck: no JVD.  Lungs: clear bilaterally to auscultation and percussion  Heart: regular rhythm, normal rate, and no murmurs.  Extremities: minimal ankle edema, no clubbing/cyanosis  Neurologic: normal CN II-XII.  Cervical Nodes: no significant adenopathy     Assessment & Plan:   OSA (obstructive sleep apnea) He needs to complete his dental work before he can get fitted for an oral appliance.  Cough He is to continue as needed xopenex.    Updated Medication List Outpatient Encounter Prescriptions as of 02/05/2011  Medication Sig Dispense Refill  . amiodarone (PACERONE) 200 MG tablet Take 0.5 tablets (100 mg total) by mouth daily.  30 tablet  11  . carvedilol (COREG) 6.25 MG tablet Take 6.25 mg by mouth 2 (two) times daily with a meal.        . hydrALAZINE (APRESOLINE) 25 MG tablet Take 25 mg by mouth 3 (three) times daily. Taking 1/2 q 8hrs      . levalbuterol (XOPENEX HFA) 45 MCG/ACT inhaler Inhale 1-2 puffs into the lungs every 4 (four) hours  as needed.        Marland Kitchen lisinopril (PRINIVIL,ZESTRIL) 5 MG tablet Take 5 mg by mouth daily.        . pravastatin (PRAVACHOL) 20 MG tablet Per PCP Take 1 tablet daily      . warfarin (COUMADIN) 2 MG tablet Take 2 mg by mouth daily. Taking 2mg .for 4 days and 4mg  3 days or as directed

## 2011-02-26 ENCOUNTER — Ambulatory Visit (INDEPENDENT_AMBULATORY_CARE_PROVIDER_SITE_OTHER): Payer: BC Managed Care – PPO | Admitting: *Deleted

## 2011-02-26 DIAGNOSIS — I4891 Unspecified atrial fibrillation: Secondary | ICD-10-CM

## 2011-03-20 ENCOUNTER — Encounter: Payer: Self-pay | Admitting: Cardiology

## 2011-03-20 ENCOUNTER — Ambulatory Visit (INDEPENDENT_AMBULATORY_CARE_PROVIDER_SITE_OTHER): Payer: BC Managed Care – PPO | Admitting: Cardiology

## 2011-03-20 VITALS — BP 108/64 | HR 60 | Ht 76.0 in | Wt 254.0 lb

## 2011-03-20 DIAGNOSIS — I428 Other cardiomyopathies: Secondary | ICD-10-CM

## 2011-03-20 DIAGNOSIS — E7800 Pure hypercholesterolemia, unspecified: Secondary | ICD-10-CM

## 2011-03-20 DIAGNOSIS — I4891 Unspecified atrial fibrillation: Secondary | ICD-10-CM

## 2011-03-20 NOTE — Assessment & Plan Note (Signed)
Patient has not had any recurrence of his atrial fibrillation and he has been aware of.  He is on long-term Coumadin.  His had no TIA symptoms.

## 2011-03-20 NOTE — Patient Instructions (Signed)
Your physician recommends that you continue on your current medications as directed. Please refer to the Current Medication list given to you today.  Your physician recommends that you schedule a follow-up appointment in: 4 months with fasting labs (lp/bmet/hfp/tsh/cbc)  

## 2011-03-20 NOTE — Assessment & Plan Note (Signed)
Patient has opted for ordinary activity now without difficulty.  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  He is able to do yard work although he still tires easily

## 2011-03-20 NOTE — Progress Notes (Signed)
Ruben [name] Date of Birth:  January 15, 1951 [facility] [address]   [address] [place], [place]  [address] [phone]           Fax   [phone]  History of Present Illness: This pleasant-year-old gentleman is seen for a scheduled followup office visit.  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  On medical therapy his ejection fraction has improved from 25% up to 50-55%.  The patient had a prior history of atrial fibrillation when he presented with a severe dilated cardiomyopathy.  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  He required cardioversion in May of 2012 and has remained in sinus rhythm since that he is on low dose amiodarone and carvedilol.  Current Outpatient Prescriptions  Medication Sig Dispense Refill  . amiodarone (PACERONE) 200 MG tablet Take 0.5 tablets (100 mg total) by mouth daily.  30 tablet  11  . carvedilol (COREG) 6.25 MG tablet Take 6.25 mg by mouth 2 (two) times daily with a meal.        . hydrALAZINE (APRESOLINE) 25 MG tablet Take 25 mg by mouth 3 (three) times daily. Taking 1/2 q 8hrs      . levalbuterol (XOPENEX HFA) 45 MCG/ACT inhaler Inhale 1-2 puffs into the lungs every 4 (four) hours as needed.        Marland Kitchen lisinopril (PRINIVIL,ZESTRIL) 5 MG tablet Take 5 mg by mouth daily.        . pravastatin (PRAVACHOL) 20 MG tablet Per PCP Take 1 tablet daily      . warfarin (COUMADIN) 2 MG tablet Take 2 mg by mouth daily. Taking 2mg .for 4 days and 4mg  3 days or as directed        No Known Allergies  Patient Active Problem List  Diagnoses  . CARDIOMYOPATHY  . Atrial fibrillation  . Cough  . OSA (obstructive sleep apnea)  . Pure hypercholesterolemia    History  Smoking status  . Never Smoker   Smokeless tobacco  . Not on file    History  Alcohol Use  . Yes    Family History  Problem Relation Age of Onset  . Hypertension Mother     Review of  Systems: Constitutional: no fever chills diaphoresis or fatigue or change in weight.  Head and neck: no hearing loss, no epistaxis, no photophobia or visual disturbance. Respiratory: No cough, shortness of breath or wheezing. Cardiovascular: No chest pain peripheral edema, palpitations. Gastrointestinal: No abdominal distention, no abdominal pain, no change in bowel habits hematochezia or melena. Genitourinary: No dysuria, no frequency, no urgency, no nocturia. Musculoskeletal:No arthralgias, no back pain, no gait disturbance or myalgias. Neurological: No dizziness, no headaches, no numbness, no seizures, no syncope, no weakness, no tremors. Hematologic: No lymphadenopathy, no easy bruising. Psychiatric: No confusion, no hallucinations, no sleep disturbance.    Physical Exam: Filed Vitals:   03/20/11 0854  BP: 108/64  Pulse: 60   general appearance reveals a well-developed well-nourished gentleman in no distress.Pupils equal and reactive.   Extraocular Movements are full.  There is no scleral icterus.  The mouth and pharynx are normal.  The neck is supple.  The carotids reveal no bruits.  The jugular venous pressure is normal.  The thyroid is not enlarged.  There is no lymphadenopathy.  The chest is clear to percussion and auscultation. There are no rales or rhonchi. Expansion of the chest is symmetrical.  The precordium is  quiet.  The first heart sound is normal.  The second heart sound is physiologically split.  There is no murmur gallop rub or click.  There is no abnormal lift or heave.  Rhythm is regularThe abdomen is soft and nontender. Bowel sounds are normal. The liver and spleen are not enlarged. There Are no abdominal masses. There are no bruits.  The pedal pulses are good.  There is no phlebitis or edema.  There is no cyanosis or clubbing. Strength is normal and symmetrical in all extremities.  There is no lateralizing weakness.  There are no sensory deficits.  The skin is  warm and dry.  There is no rash.  EKG today shows sinus bradycardia low voltage QRS.   Assessment / Plan: Patient will continue same medication.  Continuing care for heart healthy diet.  Recheck in 4 months for office visit that the panel chemistries CBC and TSH to follow up on an amiodarone

## 2011-03-20 NOTE — Assessment & Plan Note (Signed)
The patient has had a history of hypercholesterolemia.  He is on pravastatin 20 mg daily.  Not having any myalgias.  We will plan to recheck lipids at his next visit

## 2011-04-09 ENCOUNTER — Ambulatory Visit (INDEPENDENT_AMBULATORY_CARE_PROVIDER_SITE_OTHER): Admitting: *Deleted

## 2011-04-09 DIAGNOSIS — I4891 Unspecified atrial fibrillation: Secondary | ICD-10-CM

## 2011-05-07 ENCOUNTER — Ambulatory Visit (INDEPENDENT_AMBULATORY_CARE_PROVIDER_SITE_OTHER): Admitting: *Deleted

## 2011-05-07 DIAGNOSIS — I4891 Unspecified atrial fibrillation: Secondary | ICD-10-CM

## 2011-05-07 LAB — POCT INR: INR: 2.1

## 2011-06-04 ENCOUNTER — Ambulatory Visit (INDEPENDENT_AMBULATORY_CARE_PROVIDER_SITE_OTHER)

## 2011-06-04 DIAGNOSIS — I4891 Unspecified atrial fibrillation: Secondary | ICD-10-CM

## 2011-06-25 ENCOUNTER — Other Ambulatory Visit (INDEPENDENT_AMBULATORY_CARE_PROVIDER_SITE_OTHER)

## 2011-06-25 ENCOUNTER — Ambulatory Visit (INDEPENDENT_AMBULATORY_CARE_PROVIDER_SITE_OTHER): Admitting: *Deleted

## 2011-06-25 DIAGNOSIS — I4891 Unspecified atrial fibrillation: Secondary | ICD-10-CM

## 2011-06-25 DIAGNOSIS — E7800 Pure hypercholesterolemia, unspecified: Secondary | ICD-10-CM

## 2011-06-25 LAB — CBC WITH DIFFERENTIAL/PLATELET
Eosinophils Relative: 4.3 % (ref 0.0–5.0)
Monocytes Relative: 10.5 % (ref 3.0–12.0)
Neutrophils Relative %: 59 % (ref 43.0–77.0)
Platelets: 247 10*3/uL (ref 150.0–400.0)
WBC: 4.6 10*3/uL (ref 4.5–10.5)

## 2011-06-25 LAB — HEPATIC FUNCTION PANEL
AST: 20 U/L (ref 0–37)
Albumin: 3.9 g/dL (ref 3.5–5.2)
Alkaline Phosphatase: 65 U/L (ref 39–117)
Total Protein: 6.9 g/dL (ref 6.0–8.3)

## 2011-06-25 LAB — BASIC METABOLIC PANEL
Calcium: 9 mg/dL (ref 8.4–10.5)
Chloride: 104 mEq/L (ref 96–112)
Creatinine, Ser: 1.3 mg/dL (ref 0.4–1.5)

## 2011-06-25 LAB — TSH: TSH: 1.3 u[IU]/mL (ref 0.35–5.50)

## 2011-06-25 LAB — LIPID PANEL
Cholesterol: 176 mg/dL (ref 0–200)
LDL Cholesterol: 99 mg/dL (ref 0–99)
Triglycerides: 166 mg/dL — ABNORMAL HIGH (ref 0.0–149.0)

## 2011-06-25 NOTE — Progress Notes (Signed)
Quick Note:  Please make copy of labs for patient visit. ______ 

## 2011-06-27 ENCOUNTER — Other Ambulatory Visit: Payer: Self-pay | Admitting: Cardiology

## 2011-06-29 NOTE — Telephone Encounter (Signed)
Refilled lisinopril

## 2011-07-06 ENCOUNTER — Other Ambulatory Visit: Payer: BC Managed Care – PPO | Admitting: *Deleted

## 2011-07-10 ENCOUNTER — Encounter: Payer: Self-pay | Admitting: Cardiology

## 2011-07-10 ENCOUNTER — Ambulatory Visit (INDEPENDENT_AMBULATORY_CARE_PROVIDER_SITE_OTHER): Admitting: Cardiology

## 2011-07-10 ENCOUNTER — Ambulatory Visit
Admission: RE | Admit: 2011-07-10 | Discharge: 2011-07-10 | Disposition: A | Discharge status: Home / Self Care | Source: Ambulatory Visit | Attending: Cardiology | Admitting: Cardiology

## 2011-07-10 VITALS — BP 108/70 | HR 50 | Ht 76.0 in | Wt 257.0 lb

## 2011-07-10 DIAGNOSIS — I4891 Unspecified atrial fibrillation: Secondary | ICD-10-CM

## 2011-07-10 DIAGNOSIS — I480 Paroxysmal atrial fibrillation: Secondary | ICD-10-CM

## 2011-07-10 DIAGNOSIS — E7800 Pure hypercholesterolemia, unspecified: Secondary | ICD-10-CM

## 2011-07-10 DIAGNOSIS — I428 Other cardiomyopathies: Secondary | ICD-10-CM

## 2011-07-10 NOTE — Patient Instructions (Signed)
Will have you go for chest xray and call you with the results  Your physician recommends that you continue on your current medications as directed. Please refer to the Current Medication list given to you today.  Your physician wants you to follow-up in: 4 months You will receive a reminder letter in the mail two months in advance. If you don't receive a letter, please call our office to schedule the follow-up appointment.  

## 2011-07-10 NOTE — Progress Notes (Signed)
Ruben [name] Date of Birth:  Mar 28, 1951 [facility] [address] [place], [place]  [address] [phone]         Fax   [phone]  History of Present Illness: This pleasant 61 year old gentleman is seen for a followup office visit.  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  He also has a past history of atrial fibrillation secondary to the viral myocarditis.  His initial ejection fraction was 25%.  His most recent echocardiogram showed improvement to 50-55%.  He was in atrial fibrillation until May 2012 when he was cardioverted successfully.  He has maintained sinus rhythm since then on low-dose amiodarone and carvedilol.  He has returned to work and to essentially full activity.  Current Outpatient Prescriptions  Medication Sig Dispense Refill  . amiodarone (PACERONE) 200 MG tablet Take 0.5 tablets (100 mg total) by mouth daily.  30 tablet  11  . carvedilol (COREG) 6.25 MG tablet Take 6.25 mg by mouth 2 (two) times daily with a meal.        . hydrALAZINE (APRESOLINE) 25 MG tablet Take 25 mg by mouth 3 (three) times daily. Taking 1/2 q 8hrs      . levalbuterol (XOPENEX HFA) 45 MCG/ACT inhaler Inhale 1-2 puffs into the lungs every 4 (four) hours as needed.        Marland Kitchen lisinopril (PRINIVIL,ZESTRIL) 5 MG tablet TAKE ONE TABLET BY MOUTH EVERY DAY  30 tablet  11  . pravastatin (PRAVACHOL) 20 MG tablet Per PCP Take 1 tablet daily      . warfarin (COUMADIN) 2 MG tablet Take 2 mg by mouth daily. Taking 2mg .for 4 days and 4mg  3 days or as directed        No Known Allergies  Patient Active Problem List  Diagnoses  . CARDIOMYOPATHY  . Atrial fibrillation  . Cough  . OSA (obstructive sleep apnea)  . Pure hypercholesterolemia    History  Smoking status  . Never Smoker   Smokeless tobacco  . Not on file    History  Alcohol Use  . Yes    Family History  Problem Relation Age of Onset  . Hypertension Mother       Review of Systems: Constitutional: no fever chills diaphoresis or fatigue or change in weight.  Head and neck: no hearing loss, no epistaxis, no photophobia or visual disturbance. Respiratory: No cough, shortness of breath or wheezing. Cardiovascular: No chest pain peripheral edema, palpitations. Gastrointestinal: No abdominal distention, no abdominal pain, no change in bowel habits hematochezia or melena. Genitourinary: No dysuria, no frequency, no urgency, no nocturia. Musculoskeletal:No arthralgias, no back pain, no gait disturbance or myalgias. Neurological: No dizziness, no headaches, no numbness, no seizures, no syncope, no weakness, no tremors. Hematologic: No lymphadenopathy, no easy bruising. Psychiatric: No confusion, no hallucinations, no sleep disturbance.    Physical Exam: Filed Vitals:   07/10/11 0926  BP: 108/70  Pulse: 50   the general appearance reveals a well-developed well-nourished gentleman in no distress.  His weight is up 3 pounds since last visit.The head and neck exam reveals pupils equal and reactive.  Extraocular movements are full.  There is no scleral icterus.  The mouth and pharynx are normal.  The neck is supple.  The carotids reveal no bruits.  The jugular venous pressure is normal.  The  thyroid is not enlarged.  There is no lymphadenopathy.  The chest is clear to percussion and auscultation.  There  are no rales or rhonchi.  Expansion of the chest is symmetrical.  The precordium is quiet.  The first heart sound is normal.  The second heart sound is physiologically split.  There is no murmur gallop rub or click.  There is no abnormal lift or heave.  The abdomen is soft and nontender.  The bowel sounds are normal.  The liver and spleen are not enlarged.  There are no abdominal masses.  There are no abdominal bruits.  Extremities reveal good pedal pulses.  There is no phlebitis or edema.  There is no cyanosis or clubbing.  Strength is normal and symmetrical in  all extremities.  There is no lateralizing weakness.  There are no sensory deficits.  The skin is warm and dry.  There is no rash.     Assessment / Plan: Continue same medication and be rechecked in 4 months for followup office visit lipid panel nasal metabolic panel and hepatic function panel.  The patient is on amiodarone and his last chest x-ray was a year ago and we will update his x-ray today

## 2011-07-10 NOTE — Assessment & Plan Note (Signed)
The patient has not been experiencing any orthopnea or paroxysmal nocturnal dyspnea or pedal edema.

## 2011-07-10 NOTE — Assessment & Plan Note (Signed)
The patient has hypercholesterolemia and is on low-dose pravastatin.  We reviewed his recent labs which are satisfactory and he will continue same regimen

## 2011-07-10 NOTE — Assessment & Plan Note (Signed)
The patient has not been aware of any irregular pulse.  He did drink some T. had a picnic recently and noted that his heart rate went a lot faster but it did stay regular.  He knows to avoid caffeine.

## 2011-07-15 ENCOUNTER — Telehealth: Payer: Self-pay | Admitting: Cardiology

## 2011-07-15 NOTE — Telephone Encounter (Signed)
Pt notified of chest x-ray results

## 2011-07-15 NOTE — Telephone Encounter (Signed)
Pt would like xray results. 

## 2011-07-16 ENCOUNTER — Telehealth: Payer: Self-pay | Admitting: *Deleted

## 2011-07-16 NOTE — Telephone Encounter (Signed)
Advised of xray 

## 2011-07-16 NOTE — Telephone Encounter (Signed)
Message copied by Burnell Blanks on Thu Jul 16, 2011 11:09 AM ------      Message from: [name]      Created: Fri Jul 10, 2011  9:50 PM       Xray stable.  Heart size normal.

## 2011-07-23 ENCOUNTER — Ambulatory Visit (INDEPENDENT_AMBULATORY_CARE_PROVIDER_SITE_OTHER): Admitting: *Deleted

## 2011-07-23 DIAGNOSIS — I4891 Unspecified atrial fibrillation: Secondary | ICD-10-CM

## 2011-07-25 ENCOUNTER — Other Ambulatory Visit: Payer: Self-pay | Admitting: Cardiology

## 2011-08-01 ENCOUNTER — Other Ambulatory Visit: Payer: Self-pay | Admitting: Cardiology

## 2011-08-05 ENCOUNTER — Encounter: Payer: Self-pay | Admitting: Pulmonary Disease

## 2011-08-05 ENCOUNTER — Ambulatory Visit (INDEPENDENT_AMBULATORY_CARE_PROVIDER_SITE_OTHER): Admitting: Pulmonary Disease

## 2011-08-05 VITALS — BP 128/74 | HR 50 | Temp 97.9°F | Ht 76.0 in | Wt 257.4 lb

## 2011-08-05 DIAGNOSIS — G4733 Obstructive sleep apnea (adult) (pediatric): Secondary | ICD-10-CM

## 2011-08-05 DIAGNOSIS — R05 Cough: Secondary | ICD-10-CM

## 2011-08-05 DIAGNOSIS — R059 Cough, unspecified: Secondary | ICD-10-CM

## 2011-08-05 NOTE — Patient Instructions (Signed)
Follow up in 6 months 

## 2011-08-05 NOTE — Assessment & Plan Note (Signed)
He will d/w his wife and then let me know if he wants to proceed with CPAP set up in place of oral appliance.

## 2011-08-05 NOTE — Assessment & Plan Note (Signed)
Not much of an issue at present. 

## 2011-08-05 NOTE — Progress Notes (Signed)
Chief Complaint  Patient presents with  . Follow-up    Pt did not get oral appliance bc he had a lot of dental work to be done  . Cough    Pt has an occasional dry cough but not much.    History of Present Illness: Ruben [name] is a 61 y.o. male with OSA, cough with borderline obstruction on PFT, and non-ischemic cardiomyopathy.  He has not been fitted for oral appliance.  He is still getting lots of dental work.  He is not having much cough.  He has not needed xopenex for months.  He still snores, and feels tired during the day.  He was told by cardiology that his heart function is good.    Past Medical History  Diagnosis Date  . Non-ischemic cardiomyopathy        . Atrial fibrillation   . OSA (obstructive sleep apnea)   . Cough     Past Surgical History  Procedure Date  . Foot fracture surgery   . Pilonidal cyst excision     No Known Allergies  Physical Exam:  Blood pressure 128/74, pulse 50, temperature 97.9 F (36.6 C), temperature source Oral, height 6\' 4"  (1.93 m), weight 257 lb 6.4 oz (116.756 kg), SpO2 94.00%. Body mass index is 31.33 kg/(m^2). Wt Readings from Last 2 Encounters:  08/05/11 257 lb 6.4 oz (116.756 kg)  07/10/11 257 lb (116.574 kg)    General: healthy appearing and thin.  Nose: no deformity, discharge, inflammation, or lesions  Mouth: MP3, 2+ tonsills, elongated uvula  Neck: no JVD.  Lungs: clear bilaterally to auscultation and percussion  Heart: regular rhythm, normal rate, and no murmurs.  Extremities: minimal ankle edema, no clubbing/cyanosis  Neurologic: normal CN II-XII.  Cervical Nodes: no significant adenopathy   Assessment/Plan:  Outpatient Encounter Prescriptions as of 08/05/2011  Medication Sig Dispense Refill  . amiodarone (PACERONE) 200 MG tablet Take 0.5 tablets (100 mg total) by mouth daily.  30 tablet  11  . carvedilol (COREG) 6.25 MG tablet TAKE ONE TABLET BY MOUTH TWICE DAILY  60 tablet  8  . hydrALAZINE  (APRESOLINE) 25 MG tablet TAKE ONE-HALF TABLET BY MOUTH EVERY 8 HOURS  45 tablet  8  . levalbuterol (XOPENEX HFA) 45 MCG/ACT inhaler Inhale 1-2 puffs into the lungs every 4 (four) hours as needed.        Marland Kitchen lisinopril (PRINIVIL,ZESTRIL) 5 MG tablet TAKE ONE TABLET BY MOUTH EVERY DAY  30 tablet  11  . pravastatin (PRAVACHOL) 20 MG tablet Per PCP Take 1 tablet daily      . warfarin (COUMADIN) 2 MG tablet Take 1 tablet (2 mg total) by mouth daily. Take as directed by coumadin clinic  50 tablet  3    Ruben [name] Pager:  [phone] 08/05/2011, 9:16 AM

## 2011-08-07 ENCOUNTER — Telehealth: Payer: Self-pay | Admitting: Pulmonary Disease

## 2011-08-07 DIAGNOSIS — G4733 Obstructive sleep apnea (adult) (pediatric): Secondary | ICD-10-CM

## 2011-08-07 NOTE — Telephone Encounter (Signed)
Per 4.17.13 ov with VS: OSA (obstructive sleep apnea) - SOOD,VINEET, MD 08/05/2011 9:33 AM Signed  He will d/w his wife and then let me know if he wants to proceed with CPAP set up in place of oral appliance. ------------------------- Called spoke with patient who verified that he would like to go ahead with CPAP rather than using a dental appliance.  VS is not in the office today or Monday and will not return until 4.23.13 for the order specifics.  Pt okay with this and verbalized his understanding.  Dr Craige Cotta please advise on the order for CPAP.  Pt would like to use [facility].  Thanks.

## 2011-08-11 NOTE — Telephone Encounter (Signed)
Called, spoke with pt.  I informed him of below per Dr. Craige Cotta.  He verbalized understanding of this and will call back to schedule a 2 mo follow up once CPAP is set up.

## 2011-08-11 NOTE — Telephone Encounter (Signed)
Please inform patient that I have sent order for auto CPAP.  Will set him up with DME to start CPAP.  He will need follow up in 2 months after CPAP set up to review status of therapy.

## 2011-08-12 ENCOUNTER — Other Ambulatory Visit: Payer: Self-pay | Admitting: Pharmacist

## 2011-08-12 ENCOUNTER — Other Ambulatory Visit: Payer: Self-pay | Admitting: *Deleted

## 2011-08-12 ENCOUNTER — Telehealth: Payer: Self-pay | Admitting: *Deleted

## 2011-08-12 ENCOUNTER — Other Ambulatory Visit: Payer: Self-pay | Admitting: Cardiology

## 2011-08-12 MED ORDER — LISINOPRIL 5 MG PO TABS: 5.0000 mg | ORAL_TABLET | Freq: Every day | ORAL | Status: DC

## 2011-08-12 MED ORDER — CARVEDILOL 6.25 MG PO TABS: 6.2500 mg | ORAL_TABLET | Freq: Two times a day (BID) | ORAL | Status: DC

## 2011-08-12 MED ORDER — WARFARIN SODIUM 2 MG PO TABS: 2.0000 mg | ORAL_TABLET | Freq: Every day | ORAL | Status: DC

## 2011-08-12 MED ORDER — HYDRALAZINE HCL 25 MG PO TABS: 37.5000 mg | ORAL_TABLET | Freq: Three times a day (TID) | ORAL | Status: DC

## 2011-08-12 NOTE — Telephone Encounter (Signed)
FYI  Patient called to inform Dr. Patty Sermons about CPAP information.  Dr. Craige Cotta has orded CPAP and supplies.  Please give patient a call if you foresee any issues, patient can be reached at [phone] and just wanted Dr. To be aware of the change.

## 2011-08-12 NOTE — Telephone Encounter (Signed)
Will forward to  Dr. Patty Sermons for reveiew

## 2011-08-12 NOTE — Telephone Encounter (Signed)
Okay to use CPAP

## 2011-08-12 NOTE — Telephone Encounter (Signed)
[name], MD 08/12/2011 3:55 PM Signed  Molli Knock to use CPAP Regis Bill, LPN 1/61/0960 4:54 PM Signed  Will forward to Dr. Patty Sermons for reveiew Charlynn Grimes 08/12/2011 2:08 PM Signed  FYI  Patient called to inform Dr. Patty Sermons about CPAP information. Dr. Craige Cotta has orded CPAP and supplies. Please give patient a call if you foresee any issues, patient can be reached at [phone] and just wanted Dr. To be aware of the change.

## 2011-08-12 NOTE — Telephone Encounter (Signed)
Refilled lisinopril and carvedilol.

## 2011-08-14 ENCOUNTER — Other Ambulatory Visit: Payer: Self-pay | Admitting: *Deleted

## 2011-08-14 DIAGNOSIS — I4891 Unspecified atrial fibrillation: Secondary | ICD-10-CM

## 2011-08-14 MED ORDER — AMIODARONE HCL 200 MG PO TABS: 100.0000 mg | ORAL_TABLET | Freq: Every day | ORAL | Status: DC

## 2011-08-14 NOTE — Telephone Encounter (Signed)
Refilled pacerone

## 2011-08-17 ENCOUNTER — Telehealth: Payer: Self-pay | Admitting: Pulmonary Disease

## 2011-08-17 ENCOUNTER — Other Ambulatory Visit: Payer: Self-pay | Admitting: *Deleted

## 2011-08-17 DIAGNOSIS — I4891 Unspecified atrial fibrillation: Secondary | ICD-10-CM

## 2011-08-17 MED ORDER — AMIODARONE HCL 200 MG PO TABS: 100.0000 mg | ORAL_TABLET | Freq: Every day | ORAL | Status: DC

## 2011-08-17 NOTE — Telephone Encounter (Signed)
I spoke with pt and advised him according to vs d/c note he is to follow up in 6 months from his April apt. Also after being on his cpap x 2 weeks his dme will send VS a download and will determine if he needs to be seen sooner. Pt voiced his understanding and needed nothing further

## 2011-08-20 ENCOUNTER — Ambulatory Visit (INDEPENDENT_AMBULATORY_CARE_PROVIDER_SITE_OTHER)

## 2011-08-20 DIAGNOSIS — I4891 Unspecified atrial fibrillation: Secondary | ICD-10-CM

## 2011-08-20 LAB — POCT INR: INR: 2.2

## 2011-09-18 ENCOUNTER — Telehealth: Payer: Self-pay | Admitting: Pulmonary Disease

## 2011-09-18 ENCOUNTER — Encounter: Payer: Self-pay | Admitting: Pulmonary Disease

## 2011-09-18 DIAGNOSIS — G4733 Obstructive sleep apnea (adult) (pediatric): Secondary | ICD-10-CM

## 2011-09-18 NOTE — Telephone Encounter (Signed)
Auto CPAP 08/14/11 to 09/09/11>>Used on 27 of 27 nights with average 5 hrs 17 min.  Average AHI 4.3 with median CPAP 9 cm H2O and 95th percentile CPAP 11 cm H2O.  Will have my nurse inform patient that CPAP report looked very good.  Will set CPAP at 11 cm H2O.

## 2011-09-18 NOTE — Telephone Encounter (Signed)
lmomtcb x1 

## 2011-09-18 NOTE — Telephone Encounter (Signed)
Spoke with pt and notified of results per Dr. Sood. Pt verbalized understanding and denied any questions.  

## 2011-10-01 ENCOUNTER — Ambulatory Visit (INDEPENDENT_AMBULATORY_CARE_PROVIDER_SITE_OTHER): Admitting: *Deleted

## 2011-10-01 DIAGNOSIS — I4891 Unspecified atrial fibrillation: Secondary | ICD-10-CM

## 2011-10-28 ENCOUNTER — Telehealth: Payer: Self-pay | Admitting: Cardiology

## 2011-10-28 MED ORDER — WARFARIN SODIUM 2 MG PO TABS: ORAL_TABLET | ORAL | Status: DC

## 2011-10-28 NOTE — Telephone Encounter (Signed)
No answer

## 2011-10-28 NOTE — Telephone Encounter (Signed)
Scheduled ov and labs

## 2011-10-28 NOTE — Telephone Encounter (Signed)
New Problem:    Patient called having a few questions and would like to consult with you.  Please call back.

## 2011-11-10 ENCOUNTER — Other Ambulatory Visit

## 2011-11-11 ENCOUNTER — Encounter

## 2011-11-11 ENCOUNTER — Ambulatory Visit: Admitting: Cardiology

## 2011-11-12 ENCOUNTER — Encounter

## 2011-11-16 ENCOUNTER — Ambulatory Visit (INDEPENDENT_AMBULATORY_CARE_PROVIDER_SITE_OTHER): Admitting: Pharmacist

## 2011-11-16 DIAGNOSIS — I4891 Unspecified atrial fibrillation: Secondary | ICD-10-CM

## 2011-11-25 ENCOUNTER — Other Ambulatory Visit (INDEPENDENT_AMBULATORY_CARE_PROVIDER_SITE_OTHER)

## 2011-11-25 DIAGNOSIS — E7800 Pure hypercholesterolemia, unspecified: Secondary | ICD-10-CM

## 2011-11-25 LAB — BASIC METABOLIC PANEL
CO2: 26 mEq/L (ref 19–32)
Chloride: 105 mEq/L (ref 96–112)
Creatinine, Ser: 1.3 mg/dL (ref 0.4–1.5)
Potassium: 4 mEq/L (ref 3.5–5.1)
Sodium: 139 mEq/L (ref 135–145)

## 2011-11-25 LAB — LIPID PANEL
Cholesterol: 168 mg/dL (ref 0–200)
HDL: 45.3 mg/dL (ref >39.00)
Total CHOL/HDL Ratio: 4
VLDL: 44.2 mg/dL — ABNORMAL HIGH (ref 0.0–40.0)

## 2011-11-25 LAB — HEPATIC FUNCTION PANEL
Albumin: 3.7 g/dL (ref 3.5–5.2)
Alkaline Phosphatase: 69 U/L (ref 39–117)

## 2011-11-25 LAB — LDL CHOLESTEROL, DIRECT: Direct LDL: 78.3 mg/dL

## 2011-11-25 NOTE — Progress Notes (Signed)
Quick Note:  Please make copy of labs for patient visit. ______ 

## 2011-11-26 ENCOUNTER — Encounter: Payer: Self-pay | Admitting: Cardiology

## 2011-11-26 ENCOUNTER — Ambulatory Visit (INDEPENDENT_AMBULATORY_CARE_PROVIDER_SITE_OTHER): Admitting: Cardiology

## 2011-11-26 VITALS — BP 130/78 | HR 50 | Ht 76.0 in | Wt 254.0 lb

## 2011-11-26 DIAGNOSIS — I428 Other cardiomyopathies: Secondary | ICD-10-CM

## 2011-11-26 DIAGNOSIS — E7800 Pure hypercholesterolemia, unspecified: Secondary | ICD-10-CM

## 2011-11-26 DIAGNOSIS — I4891 Unspecified atrial fibrillation: Secondary | ICD-10-CM

## 2011-11-26 NOTE — Patient Instructions (Signed)
Decrease your Amiodarone to 1/2 tablet every other day  Your physician recommends that you schedule a follow-up appointment in: 4 months with fasting labs (lp/bmet/hfp/cbc/t4/tsh)

## 2011-11-26 NOTE — Assessment & Plan Note (Signed)
The patient has not been aware of any palpitations or recurrent atrial fibrillation.  No TIA symptoms.  No problems from his Coumadin

## 2011-11-26 NOTE — Assessment & Plan Note (Signed)
His lipids are remaining satisfactory on Pravachol 20 mg daily

## 2011-11-26 NOTE — Progress Notes (Signed)
Ruben [name] Date of Birth:  1950-07-30 [facility] [address] [place], [place]  [address] [phone]         Fax   [phone]  History of Present Illness: This pleasant 61 year old gentleman is seen for a four-month followup visit.  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  His most recent echocardiogram showed significant improvement of his left ventricular function back to an ejection fraction of 50-55%.  In may 2012 he was successfully cardioverted.  He is maintained on warfarin.  He's had no recurrent atrial fib.  He has been on low-dose amiodarone.  He does complain of malaise and fatigue. Current Outpatient Prescriptions  Medication Sig Dispense Refill  . amiodarone (PACERONE) 200 MG tablet Take 200 mg by mouth as directed. 1/2 tablet every other day      . carvedilol (COREG) 6.25 MG tablet Take 1 tablet (6.25 mg total) by mouth 2 (two) times daily with a meal.  180 tablet  3  . hydrALAZINE (APRESOLINE) 25 MG tablet Take 1.5 tablets (37.5 mg total) by mouth 3 (three) times daily.  135 tablet  3  . levalbuterol (XOPENEX HFA) 45 MCG/ACT inhaler Inhale 1-2 puffs into the lungs every 4 (four) hours as needed.        Marland Kitchen lisinopril (PRINIVIL,ZESTRIL) 5 MG tablet Take 1 tablet (5 mg total) by mouth daily.  90 tablet  3  . pravastatin (PRAVACHOL) 20 MG tablet Per PCP Take 1 tablet daily      . warfarin (COUMADIN) 2 MG tablet Take 2 tablet 4 days a week and 1 tablet other days or directed by coumadin clinic  150 tablet  1  . DISCONTD: amiodarone (PACERONE) 200 MG tablet Take 0.5 tablets (100 mg total) by mouth daily.  90 tablet  3    No Known Allergies  Patient Active Problem List  Diagnosis  . CARDIOMYOPATHY  . Atrial fibrillation  . Cough  . OSA (obstructive sleep apnea)  . Pure hypercholesterolemia    History  Smoking status  . Never Smoker   Smokeless tobacco  . Not on file    History    Alcohol Use  . Yes    Family History  Problem Relation Age of Onset  . Hypertension Mother     Review of Systems: Constitutional: no fever chills diaphoresis or fatigue or change in weight.  Head and neck: no hearing loss, no epistaxis, no photophobia or visual disturbance. Respiratory: No cough, shortness of breath or wheezing. Cardiovascular: No chest pain peripheral edema, palpitations. Gastrointestinal: No abdominal distention, no abdominal pain, no change in bowel habits hematochezia or melena. Genitourinary: No dysuria, no frequency, no urgency, no nocturia. Musculoskeletal:No arthralgias, no back pain, no gait disturbance or myalgias. Neurological: No dizziness, no headaches, no numbness, no seizures, no syncope, no weakness, no tremors. Hematologic: No lymphadenopathy, no easy bruising. Psychiatric: No confusion, no hallucinations, no sleep disturbance.    Physical Exam: Filed Vitals:   11/26/11 1211  BP: 130/78  Pulse: 50   the general appearance feels a well-developed well-nourished large gentleman in no distress.  His weight is down 3 pounds since last visit.The head and neck exam reveals pupils equal and reactive.  Extraocular movements are full.  There is no scleral icterus.  The mouth and pharynx are normal.  The neck is supple.  The carotids reveal no bruits.  The jugular venous pressure is normal.  The  thyroid is not enlarged.  There is no lymphadenopathy.  The chest is clear to percussion and auscultation.  There are no rales or rhonchi.  Expansion of the chest is symmetrical.  The precordium is quiet.  The first heart sound is normal.  The second heart sound is physiologically split.  There is no murmur gallop rub or click.  There is no abnormal lift or heave.  The abdomen is soft and nontender.  The bowel sounds are normal.  The liver and spleen are not enlarged.  There are no abdominal masses.  There are no abdominal bruits.  Extremities reveal good pedal pulses.   There is no phlebitis or edema.  There is no cyanosis or clubbing.  Strength is normal and symmetrical in all extremities.  There is no lateralizing weakness.  There are no sensory deficits.  The skin is warm and dry.  There is no rash.  EKG today shows sinus bradycardia at 50 per minute and no ischemic changes   Assessment / Plan: We will decrease his amiodarone to just 100 mg every other day.  He'll continue other medicines the same.  He had a chest x-ray in March which showed normal heart size and clear lungs.  He'll return in 4 months for followup lipid panel hepatic function panel nasal metabolic panel as well as CBC and thyroid function studies.

## 2011-11-26 NOTE — Assessment & Plan Note (Signed)
No chest pain or shortness of breath with ordinary activity.  He does get short of breath with moderately severe exertion and he is avoiding lifting anything more than 50 pounds at work.

## 2011-12-28 ENCOUNTER — Ambulatory Visit (INDEPENDENT_AMBULATORY_CARE_PROVIDER_SITE_OTHER)

## 2011-12-28 DIAGNOSIS — I4891 Unspecified atrial fibrillation: Secondary | ICD-10-CM

## 2011-12-28 LAB — POCT INR: INR: 1.9

## 2012-02-01 ENCOUNTER — Ambulatory Visit (INDEPENDENT_AMBULATORY_CARE_PROVIDER_SITE_OTHER): Admitting: *Deleted

## 2012-02-01 DIAGNOSIS — I4891 Unspecified atrial fibrillation: Secondary | ICD-10-CM

## 2012-02-01 LAB — POCT INR: INR: 1.8

## 2012-02-05 ENCOUNTER — Telehealth: Payer: Self-pay | Admitting: Cardiology

## 2012-02-05 ENCOUNTER — Ambulatory Visit (INDEPENDENT_AMBULATORY_CARE_PROVIDER_SITE_OTHER): Admitting: Pulmonary Disease

## 2012-02-05 ENCOUNTER — Encounter: Payer: Self-pay | Admitting: Pulmonary Disease

## 2012-02-05 VITALS — BP 100/60 | HR 51 | Temp 97.1°F | Ht 76.0 in | Wt 255.0 lb

## 2012-02-05 DIAGNOSIS — R059 Cough, unspecified: Secondary | ICD-10-CM

## 2012-02-05 DIAGNOSIS — R05 Cough: Secondary | ICD-10-CM

## 2012-02-05 DIAGNOSIS — G4733 Obstructive sleep apnea (adult) (pediatric): Secondary | ICD-10-CM

## 2012-02-05 NOTE — Telephone Encounter (Signed)
New Problem:     Patient called in needing clarification on his hydrALAZINE (APRESOLINE) 25 MG tablet dose.  Please call back.

## 2012-02-05 NOTE — Assessment & Plan Note (Signed)
He is compliant with CPAP and reports benefit.  No change to current set up.

## 2012-02-05 NOTE — Patient Instructions (Signed)
Follow up in 1 year.

## 2012-02-05 NOTE — Assessment & Plan Note (Signed)
Improved.  Advised he can call if he feels like he needs inhaler therapy again.

## 2012-02-05 NOTE — Progress Notes (Signed)
Chief Complaint  Patient presents with  . Follow-up    Pt reports breathing is good, occasional cold/congestion from time to time ,occasional SOB w exertion--tolerating CPAP well, wearing 5-7hrs per night unsure of pressure setting    History of Present Illness: Ruben [name] is a 61 y.o. male with OSA, cough with borderline obstruction on PFT, and non-ischemic cardiomyopathy.  He is doing well with CPAP.  He has nasal mask.  He uses this for about 6 to 7 hours per night.    He is not having cough.  He can't remember when he needed to use xopenex last.  Tests: PFT 06/25/10>>FEV1 2.94(77%), FEV1% 73, TLC 6.75(83%), DLCO 70%, no BD PSG 08/21/10>>AHI 12.5, RDI 24.9, SpO2 low 89% Auto CPAP 08/14/11 to 09/09/11>>Used on 27 of 27 nights with average 5 hrs 17 min. Average AHI 4.3 with median CPAP 9 cm H2O and 95th percentile CPAP 11 cm H2O.   Past Medical History  Diagnosis Date  . Non-ischemic cardiomyopathy        . Atrial fibrillation   . OSA (obstructive sleep apnea)   . Cough     Past Surgical History  Procedure Date  . Foot fracture surgery   . Pilonidal cyst excision     No Known Allergies  Physical Exam:  Blood pressure 100/60, pulse 51, temperature 97.1 F (36.2 C), temperature source Oral, height 6\' 4"  (1.93 m), weight 255 lb (115.667 kg), SpO2 97.00%.  Body mass index is 31.04 kg/(m^2).  Wt Readings from Last 2 Encounters:  02/05/12 255 lb (115.667 kg)  11/26/11 254 lb (115.214 kg)    General: healthy appearing and thin.  Nose: no deformity, discharge, inflammation, or lesions  Mouth: MP3, 2+ tonsills, elongated uvula  Neck: no JVD.  Lungs: clear bilaterally to auscultation and percussion  Heart: regular rhythm, normal rate, and no murmurs.  Extremities: minimal ankle edema, no clubbing/cyanosis  Neurologic: normal CN II-XII.  Cervical Nodes: no significant adenopathy   Assessment/Plan:  Outpatient Encounter Prescriptions as of 02/05/2012    Medication Sig Dispense Refill  . amiodarone (PACERONE) 200 MG tablet Take 200 mg by mouth as directed. 1/2 tablet every other day      . carvedilol (COREG) 6.25 MG tablet Take 1 tablet (6.25 mg total) by mouth 2 (two) times daily with a meal.  180 tablet  3  . hydrALAZINE (APRESOLINE) 25 MG tablet Take 1.5 tablets (37.5 mg total) by mouth 3 (three) times daily.  135 tablet  3  . levalbuterol (XOPENEX HFA) 45 MCG/ACT inhaler Inhale 1-2 puffs into the lungs every 4 (four) hours as needed.        Marland Kitchen lisinopril (PRINIVIL,ZESTRIL) 5 MG tablet Take 1 tablet (5 mg total) by mouth daily.  90 tablet  3  . warfarin (COUMADIN) 2 MG tablet Take 2 tablet 5 days a week and 1 tablet other days or directed by coumadin clinic      . DISCONTD: warfarin (COUMADIN) 2 MG tablet Take 2 tablet 4 days a week and 1 tablet other days or directed by coumadin clinic  150 tablet  1  . pravastatin (PRAVACHOL) 20 MG tablet Per PCP Take 1 tablet daily        [name] Pager:  [phone] 02/05/2012, 2:49 PM

## 2012-02-05 NOTE — Telephone Encounter (Signed)
Patient phoned in stating that he had received AVS at pulmonary doctor and Hydralazine listed as 1 1/2 tablets 3 times a day and he has only been taking 1/2 tablet 3 times a day.  Advised patient to continue current regimen. Changed in chart

## 2012-02-29 ENCOUNTER — Ambulatory Visit (INDEPENDENT_AMBULATORY_CARE_PROVIDER_SITE_OTHER): Admitting: *Deleted

## 2012-02-29 DIAGNOSIS — I4891 Unspecified atrial fibrillation: Secondary | ICD-10-CM

## 2012-03-21 ENCOUNTER — Ambulatory Visit (INDEPENDENT_AMBULATORY_CARE_PROVIDER_SITE_OTHER): Admitting: *Deleted

## 2012-03-21 DIAGNOSIS — I4891 Unspecified atrial fibrillation: Secondary | ICD-10-CM

## 2012-03-21 LAB — POCT INR: INR: 2.2

## 2012-03-24 ENCOUNTER — Other Ambulatory Visit: Payer: Self-pay | Admitting: Pharmacist

## 2012-03-24 MED ORDER — WARFARIN SODIUM 2 MG PO TABS: ORAL_TABLET | ORAL | Status: DC

## 2012-03-28 ENCOUNTER — Ambulatory Visit (INDEPENDENT_AMBULATORY_CARE_PROVIDER_SITE_OTHER): Admitting: *Deleted

## 2012-03-28 DIAGNOSIS — I4891 Unspecified atrial fibrillation: Secondary | ICD-10-CM

## 2012-03-28 DIAGNOSIS — I428 Other cardiomyopathies: Secondary | ICD-10-CM

## 2012-03-28 DIAGNOSIS — E7800 Pure hypercholesterolemia, unspecified: Secondary | ICD-10-CM

## 2012-03-28 LAB — LIPID PANEL
HDL: 33.2 mg/dL — ABNORMAL LOW (ref >39.00)
Total CHOL/HDL Ratio: 5
Triglycerides: 325 mg/dL — ABNORMAL HIGH (ref 0.0–149.0)
VLDL: 65 mg/dL — ABNORMAL HIGH (ref 0.0–40.0)

## 2012-03-28 LAB — CBC WITH DIFFERENTIAL/PLATELET
Basophils Relative: 0.8 % (ref 0.0–3.0)
Eosinophils Absolute: 0.2 10*3/uL (ref 0.0–0.7)
Hemoglobin: 13.2 g/dL (ref 13.0–17.0)
MCHC: 33 g/dL (ref 30.0–36.0)
MCV: 95.7 fl (ref 78.0–100.0)
Monocytes Absolute: 0.5 10*3/uL (ref 0.1–1.0)
Neutro Abs: 2.8 10*3/uL (ref 1.4–7.7)
Neutrophils Relative %: 53.7 % (ref 43.0–77.0)
RBC: 4.18 Mil/uL — ABNORMAL LOW (ref 4.22–5.81)

## 2012-03-28 LAB — BASIC METABOLIC PANEL
CO2: 26 mEq/L (ref 19–32)
Chloride: 105 mEq/L (ref 96–112)
Creatinine, Ser: 1.3 mg/dL (ref 0.4–1.5)

## 2012-03-28 LAB — HEPATIC FUNCTION PANEL
Bilirubin, Direct: 0 mg/dL (ref 0.0–0.3)
Total Bilirubin: 0.4 mg/dL (ref 0.3–1.2)

## 2012-03-28 LAB — LDL CHOLESTEROL, DIRECT: Direct LDL: 75 mg/dL

## 2012-03-28 NOTE — Progress Notes (Signed)
Quick Note:  Please make copy of labs for patient visit. ______ 

## 2012-03-30 ENCOUNTER — Ambulatory Visit (INDEPENDENT_AMBULATORY_CARE_PROVIDER_SITE_OTHER): Admitting: Cardiology

## 2012-03-30 ENCOUNTER — Encounter: Payer: Self-pay | Admitting: Cardiology

## 2012-03-30 VITALS — BP 107/66 | HR 48 | Resp 18 | Ht 74.0 in | Wt 252.1 lb

## 2012-03-30 DIAGNOSIS — E7800 Pure hypercholesterolemia, unspecified: Secondary | ICD-10-CM

## 2012-03-30 DIAGNOSIS — I480 Paroxysmal atrial fibrillation: Secondary | ICD-10-CM

## 2012-03-30 DIAGNOSIS — R5383 Other fatigue: Secondary | ICD-10-CM

## 2012-03-30 DIAGNOSIS — R5381 Other malaise: Secondary | ICD-10-CM | POA: Insufficient documentation

## 2012-03-30 DIAGNOSIS — I4891 Unspecified atrial fibrillation: Secondary | ICD-10-CM

## 2012-03-30 NOTE — Assessment & Plan Note (Signed)
The patient continues to complain of malaise and easy fatigue.  He continues to have significant sinus bradycardia at 48 beats per minute.  We will reduce his carvedilol down to 3.125 mg twice a day to allow a higher resting heart

## 2012-03-30 NOTE — Assessment & Plan Note (Signed)
The patient has not been aware of any recurrent atrial fibrillation.  In the past he was unaware of his heart rhythm when he was in atrial fib.  He remains on long-term Coumadin anticoagulation.  He is not having any hemorrhagic side effects.

## 2012-03-30 NOTE — Patient Instructions (Signed)
Decrease your Carvedilol to to 1/2 tablet twice a day  Your physician wants you to follow-up in: 4 months with fasting labs (lp/bmet/hfp) AND EKG You will receive a reminder letter in the mail two months in advance. If you don't receive a letter, please call our office to schedule the follow-up appointment.

## 2012-03-30 NOTE — Progress Notes (Signed)
Ruben [name] Date of Birth:  [birth year] [facility] [address] [name], [place]  [address] [phone]         Fax   [phone]  History of Present Illness: This pleasant 61 year old gentleman is seen for a four-month followup visit. 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 His most recent echocardiogram showed significant improvement of his left ventricular function back to an ejection fraction of 50-55%. In may 2012 he was successfully cardioverted. He is maintained on warfarin. He's had no recurrent atrial fib. He has been on low-dose amiodarone. He does complain of malaise and fatigue.  His last visit we cut him back on his amiodarone.  He still complains of easy fatigue.  The patient also has sleep apnea and uses a CPAP machine.   Current Outpatient Prescriptions  Medication Sig Dispense Refill  . amiodarone (PACERONE) 200 MG tablet Take 200 mg by mouth as directed. 1/2 tablet every other day      . carvedilol (COREG) 6.25 MG tablet Take 6.25 mg by mouth as directed. 1/2 TABLET TWICE A DAY      . hydrALAZINE (APRESOLINE) 25 MG tablet Take 25 mg by mouth as directed. 1/2 tablet 3 times a day      . lisinopril (PRINIVIL,ZESTRIL) 5 MG tablet Take 1 tablet (5 mg total) by mouth daily.  90 tablet  3  . pravastatin (PRAVACHOL) 20 MG tablet Per PCP Take 1 tablet daily      . warfarin (COUMADIN) 2 MG tablet Take as directed by Anticoagulation clinic  180 tablet  1  . [DISCONTINUED] carvedilol (COREG) 6.25 MG tablet Take 1 tablet (6.25 mg total) by mouth 2 (two) times daily with a meal.  180 tablet  3    No Known Allergies  Patient Active Problem List  Diagnosis  . CARDIOMYOPATHY  . Atrial fibrillation  . Cough  . OSA (obstructive sleep apnea)  . Pure hypercholesterolemia    History  Smoking status  . Never Smoker   Smokeless tobacco  . Never Used    History  Alcohol Use  . Yes    Family  History  Problem Relation Age of Onset  . Hypertension Mother     Review of Systems: Constitutional: no fever chills diaphoresis or fatigue or change in weight.  Head and neck: no hearing loss, no epistaxis, no photophobia or visual disturbance. Respiratory: No cough, shortness of breath or wheezing. Cardiovascular: No chest pain peripheral edema, palpitations. Gastrointestinal: No abdominal distention, no abdominal pain, no change in bowel habits hematochezia or melena. Genitourinary: No dysuria, no frequency, no urgency, no nocturia. Musculoskeletal:No arthralgias, no back pain, no gait disturbance or myalgias. Neurological: No dizziness, no headaches, no numbness, no seizures, no syncope, no weakness, no tremors. Hematologic: No lymphadenopathy, no easy bruising. Psychiatric: No confusion, no hallucinations, no sleep disturbance.    Physical Exam: Filed Vitals:   03/30/12 0905  BP: 107/66  Pulse: 48  Resp: 18   the general appearance reveals a well-developed well-nourished gentleman in no distress.The head and neck exam reveals pupils equal and reactive.  Extraocular movements are full.  There is no scleral icterus.  The mouth and pharynx are normal.  The neck is supple.  The carotids reveal no bruits.  The jugular venous pressure is normal.  The  thyroid is not enlarged.  There is no lymphadenopathy.  The chest is clear to percussion and auscultation.  There are no rales or  rhonchi.  Expansion of the chest is symmetrical.  The precordium is quiet.  The first heart sound is normal.  The second heart sound is physiologically split.  There is no murmur gallop rub or click.  There is no abnormal lift or heave.  The abdomen is soft and nontender.  The bowel sounds are normal.  The liver and spleen are not enlarged.  There are no abdominal masses.  There are no abdominal bruits.  Extremities reveal good pedal pulses.  There is no phlebitis or edema.  There is no cyanosis or clubbing.  Strength  is normal and symmetrical in all extremities.  There is no lateralizing weakness.  There are no sensory deficits.  The skin is warm and dry.  There is no rash.    Assessment / Plan: Continue on same medication except reduced dose of carvedilol.  Recheck in 4 months for followup office visit lipid panel hepatic function panel basal metabolic panel and EKG.  Watch diet carefully.

## 2012-03-30 NOTE — Assessment & Plan Note (Signed)
The patient is trying to watch his diet.  His weight is down 2 pounds.  He is not having any side effects from the pravastatin.  His blood sugar and triglycerides are higher today secondary to eating sweets

## 2012-04-19 ENCOUNTER — Ambulatory Visit (INDEPENDENT_AMBULATORY_CARE_PROVIDER_SITE_OTHER): Admitting: *Deleted

## 2012-04-19 DIAGNOSIS — I4891 Unspecified atrial fibrillation: Secondary | ICD-10-CM

## 2012-04-19 LAB — POCT INR: INR: 2.5

## 2012-04-28 ENCOUNTER — Other Ambulatory Visit: Payer: Self-pay | Admitting: Cardiology

## 2012-04-28 MED ORDER — AMIODARONE HCL 200 MG PO TABS: 200.0000 mg | ORAL_TABLET | ORAL | Status: DC

## 2012-05-13 ENCOUNTER — Telehealth: Payer: Self-pay | Admitting: Cardiology

## 2012-05-13 NOTE — Telephone Encounter (Signed)
New problem:    pacerone  200 mg   Apresoline  25 mg   [facility] [phone].

## 2012-05-16 MED ORDER — AMIODARONE HCL 200 MG PO TABS: 100.0000 mg | ORAL_TABLET | ORAL | Status: DC

## 2012-05-16 MED ORDER — HYDRALAZINE HCL 25 MG PO TABS: 12.5000 mg | ORAL_TABLET | Freq: Three times a day (TID) | ORAL | Status: DC

## 2012-05-17 ENCOUNTER — Ambulatory Visit (INDEPENDENT_AMBULATORY_CARE_PROVIDER_SITE_OTHER): Admitting: *Deleted

## 2012-05-17 DIAGNOSIS — I4891 Unspecified atrial fibrillation: Secondary | ICD-10-CM

## 2012-05-27 DIAGNOSIS — E785 Hyperlipidemia, unspecified: Secondary | ICD-10-CM | POA: Insufficient documentation

## 2012-06-14 ENCOUNTER — Ambulatory Visit (INDEPENDENT_AMBULATORY_CARE_PROVIDER_SITE_OTHER): Admitting: *Deleted

## 2012-06-14 DIAGNOSIS — Z7901 Long term (current) use of anticoagulants: Secondary | ICD-10-CM

## 2012-06-14 DIAGNOSIS — I4891 Unspecified atrial fibrillation: Secondary | ICD-10-CM

## 2012-06-14 LAB — POCT INR: INR: 2.1

## 2012-06-27 ENCOUNTER — Other Ambulatory Visit: Payer: Self-pay | Admitting: *Deleted

## 2012-06-27 MED ORDER — CARVEDILOL 6.25 MG PO TABS: ORAL_TABLET | ORAL | Status: DC

## 2012-06-28 IMAGING — CR DG CHEST 2V
2 series · 2 of 2 positions shown · non-contrast
Comparison: 05/23/2010 and a CT scan dated 04/24/2010

CLINICAL DATA: Paroxysmal atrial fibrillation.  427.31.

CHEST - 2 VIEW

[w chest pa]
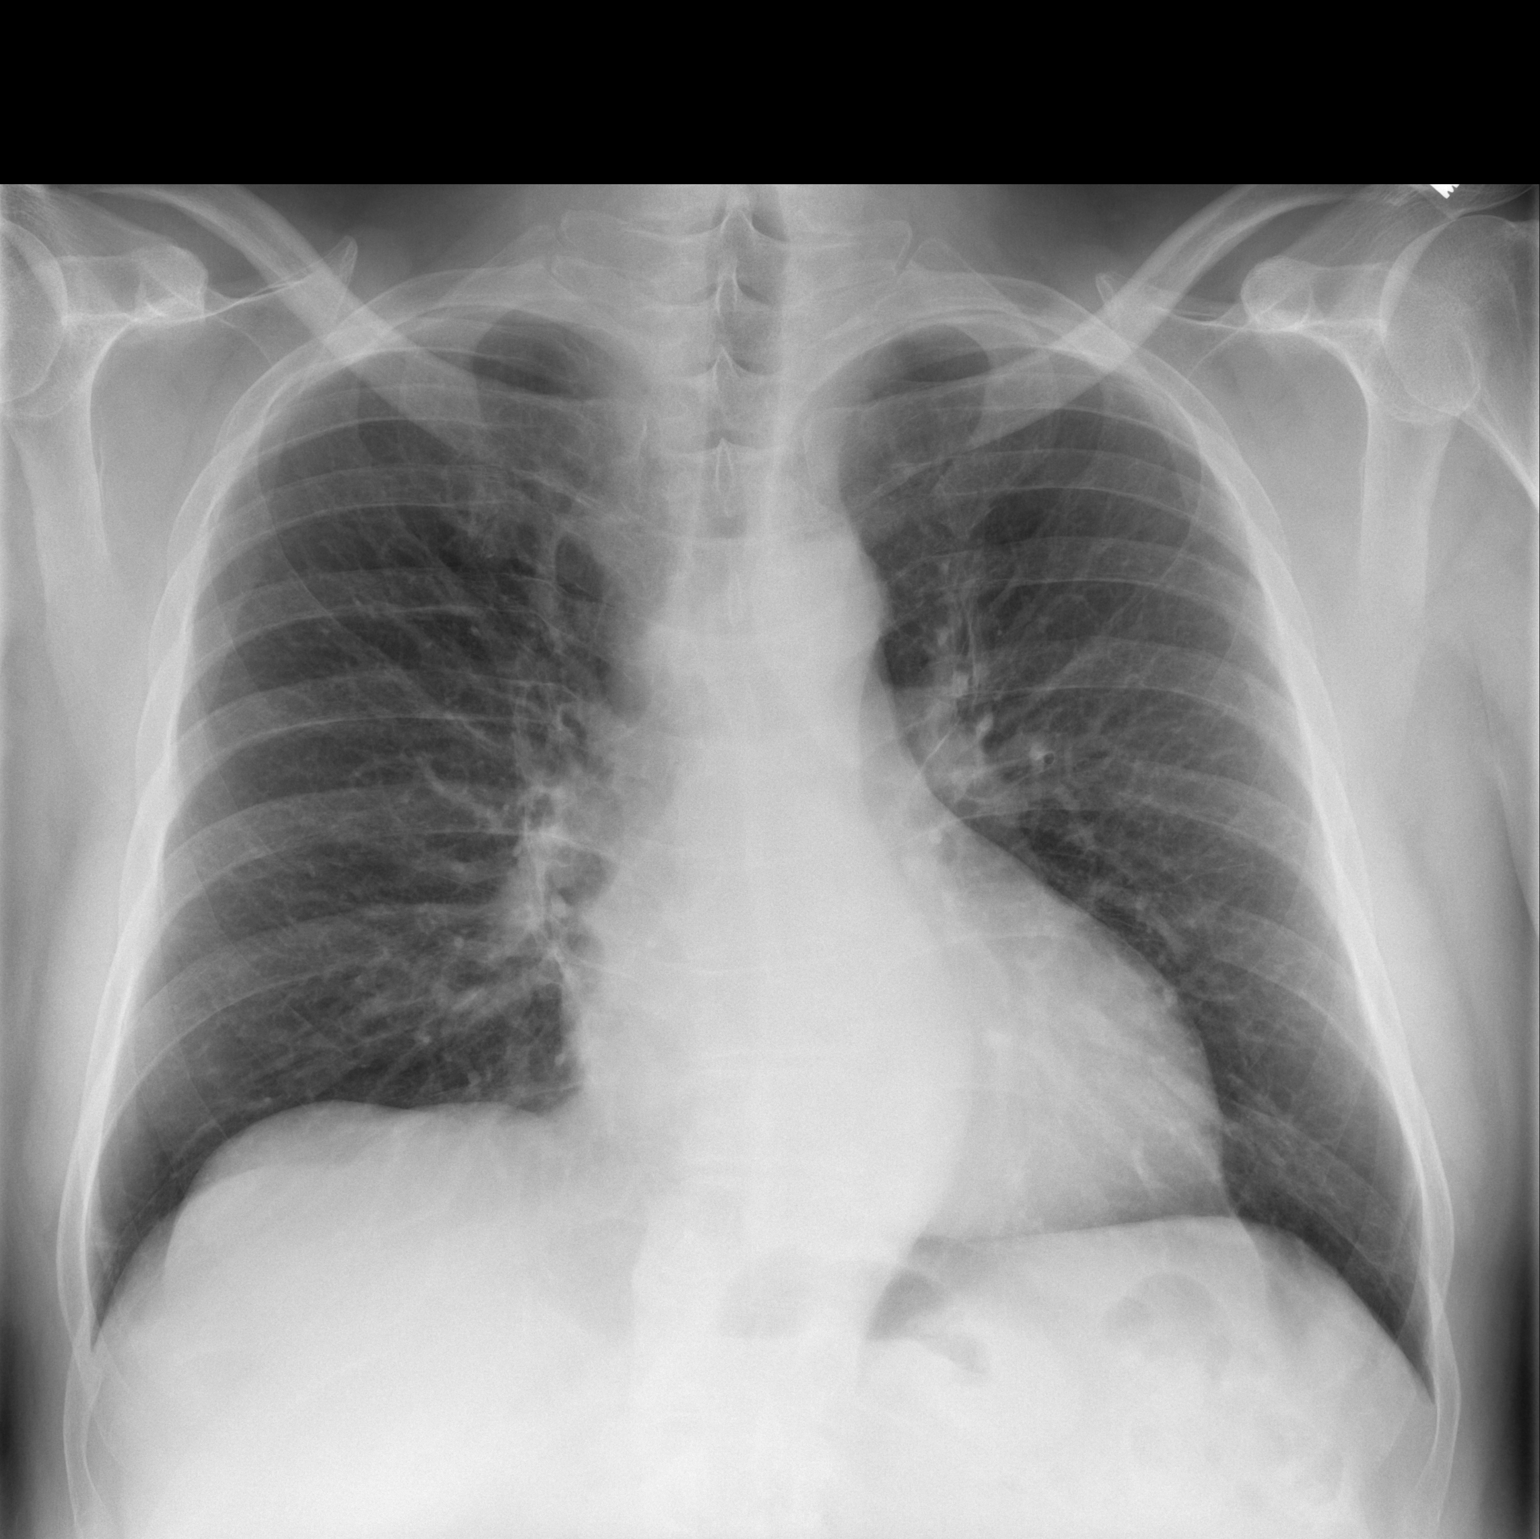

[w chest lat]
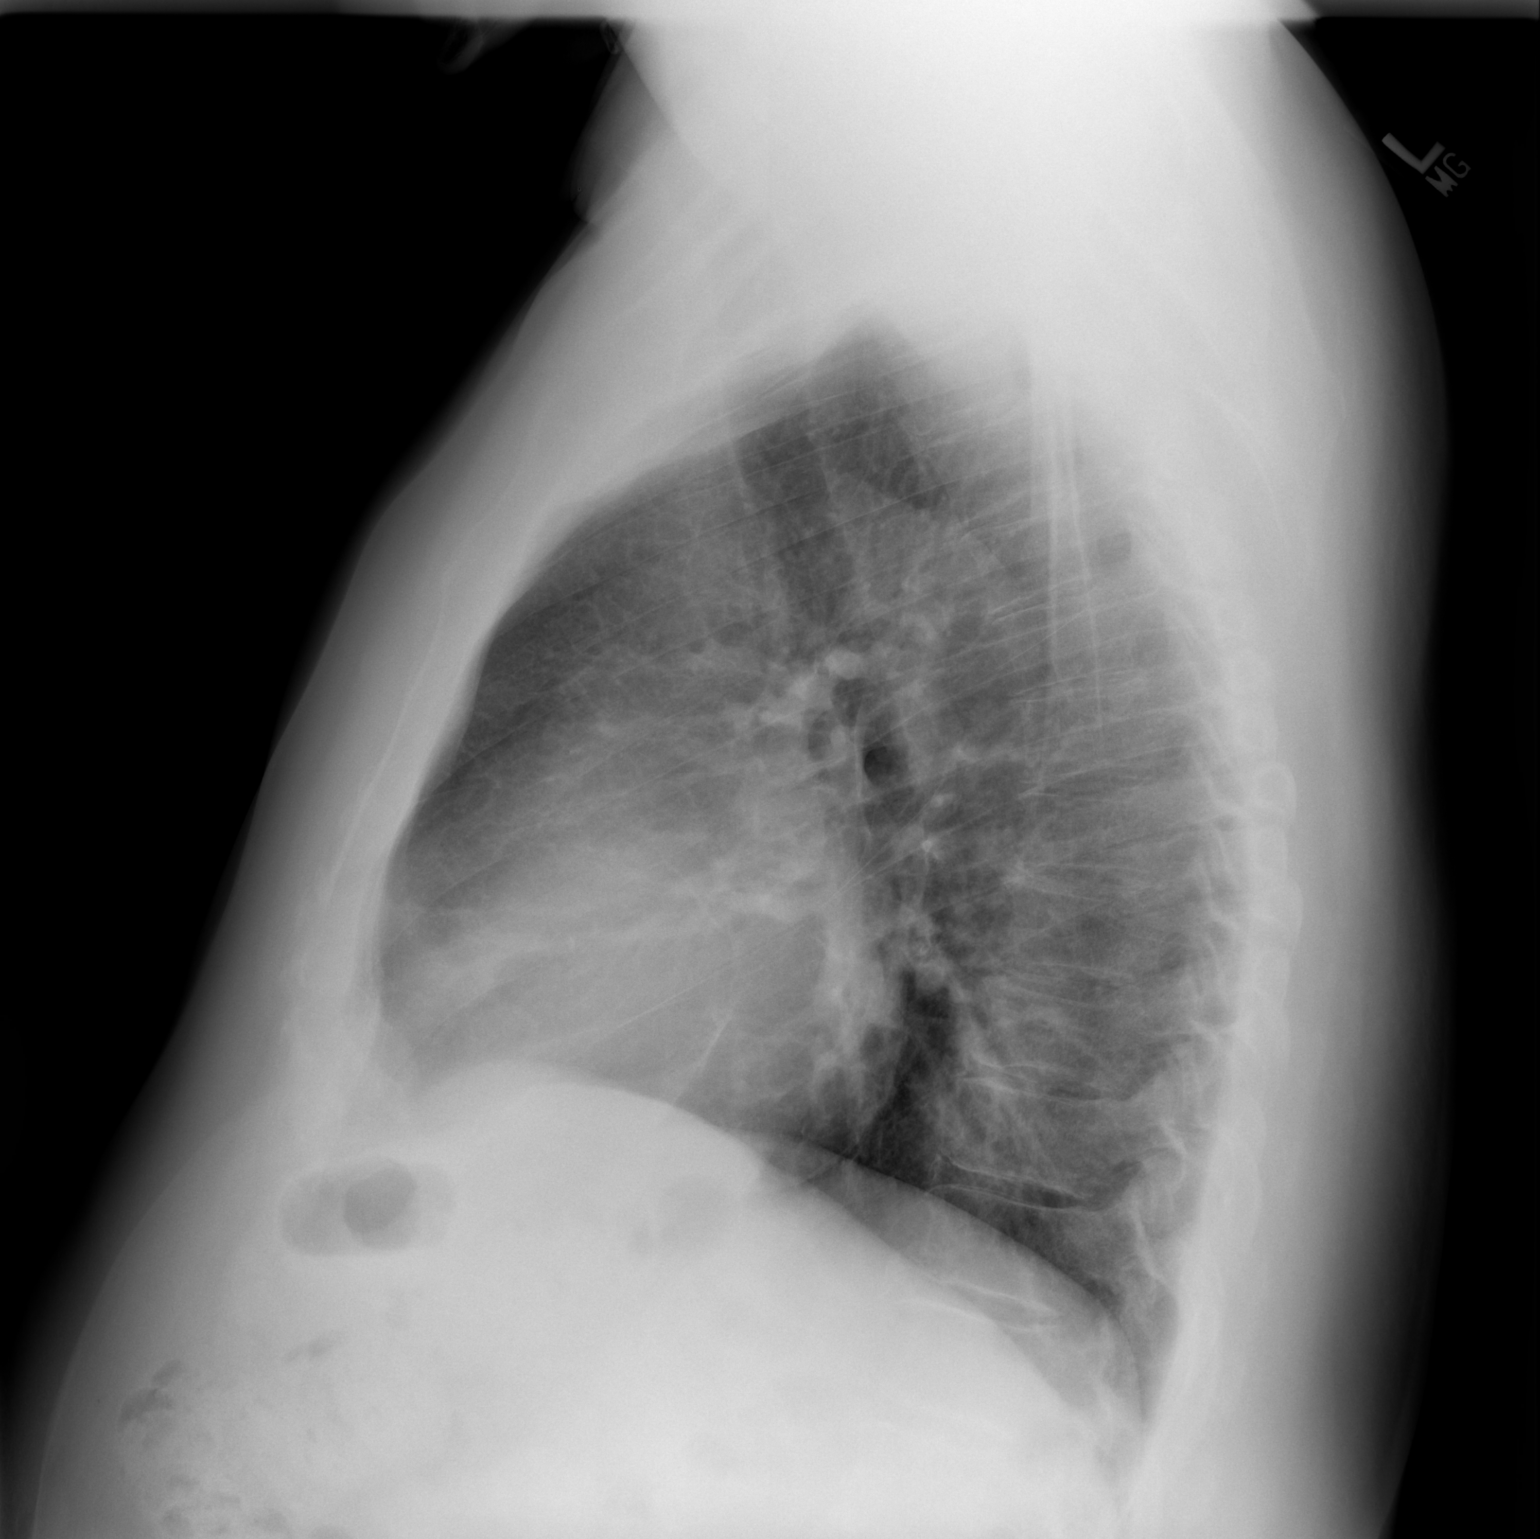

[2 of 2 positions shown; findings below may reference images not displayed]

FINDINGS: Heart size and pulmonary vascularity are normal.  There
is tortuosity of the thoracic aorta. Lungs are clear except for
minimal scarring in the medial aspect of the right middle lobe.
IMPRESSION: No acute disease.

## 2012-07-01 ENCOUNTER — Other Ambulatory Visit: Payer: Self-pay | Admitting: *Deleted

## 2012-07-01 MED ORDER — CARVEDILOL 6.25 MG PO TABS: ORAL_TABLET | ORAL | Status: DC

## 2012-07-12 ENCOUNTER — Ambulatory Visit (INDEPENDENT_AMBULATORY_CARE_PROVIDER_SITE_OTHER): Admitting: *Deleted

## 2012-07-12 DIAGNOSIS — Z7901 Long term (current) use of anticoagulants: Secondary | ICD-10-CM

## 2012-07-12 DIAGNOSIS — I4891 Unspecified atrial fibrillation: Secondary | ICD-10-CM

## 2012-07-26 ENCOUNTER — Telehealth: Payer: Self-pay | Admitting: Cardiology

## 2012-07-26 MED ORDER — LISINOPRIL 5 MG PO TABS: 5.0000 mg | ORAL_TABLET | Freq: Every day | ORAL | Status: DC

## 2012-07-26 NOTE — Telephone Encounter (Signed)
New problem    Pt have a question about his  new prescription for Lisinopril, pt don't know mg on the medication. Pt stated that [facility] is trying contact someone about getting this filled. Please call pt concerning this matter.

## 2012-07-26 NOTE — Telephone Encounter (Signed)
Lisinopril 5 mg one tablet once a day; [facility], dispense 90 pills with 3 refills. Pt aware.

## 2012-07-28 ENCOUNTER — Ambulatory Visit: Admitting: Cardiology

## 2012-08-04 ENCOUNTER — Other Ambulatory Visit: Payer: Self-pay | Admitting: *Deleted

## 2012-08-04 MED ORDER — HYDRALAZINE HCL 25 MG PO TABS: 12.5000 mg | ORAL_TABLET | Freq: Three times a day (TID) | ORAL | Status: DC

## 2012-08-09 ENCOUNTER — Ambulatory Visit (INDEPENDENT_AMBULATORY_CARE_PROVIDER_SITE_OTHER)

## 2012-08-09 DIAGNOSIS — I4891 Unspecified atrial fibrillation: Secondary | ICD-10-CM

## 2012-08-09 DIAGNOSIS — Z7901 Long term (current) use of anticoagulants: Secondary | ICD-10-CM

## 2012-08-31 ENCOUNTER — Encounter: Payer: Self-pay | Admitting: Cardiology

## 2012-08-31 ENCOUNTER — Ambulatory Visit (INDEPENDENT_AMBULATORY_CARE_PROVIDER_SITE_OTHER): Admitting: Cardiology

## 2012-08-31 ENCOUNTER — Ambulatory Visit (INDEPENDENT_AMBULATORY_CARE_PROVIDER_SITE_OTHER): Admitting: *Deleted

## 2012-08-31 VITALS — BP 120/72 | HR 51 | Ht 76.0 in | Wt 252.8 lb

## 2012-08-31 DIAGNOSIS — I480 Paroxysmal atrial fibrillation: Secondary | ICD-10-CM

## 2012-08-31 DIAGNOSIS — I428 Other cardiomyopathies: Secondary | ICD-10-CM

## 2012-08-31 DIAGNOSIS — I4891 Unspecified atrial fibrillation: Secondary | ICD-10-CM

## 2012-08-31 DIAGNOSIS — Z7901 Long term (current) use of anticoagulants: Secondary | ICD-10-CM

## 2012-08-31 DIAGNOSIS — E7800 Pure hypercholesterolemia, unspecified: Secondary | ICD-10-CM

## 2012-08-31 NOTE — Assessment & Plan Note (Signed)
The patient has not been experiencing any recent increased shortness of breath.  No chest pain.

## 2012-08-31 NOTE — Assessment & Plan Note (Signed)
Patient remains on pravastatin.  He is having no myalgia side effects.

## 2012-08-31 NOTE — Assessment & Plan Note (Signed)
The patient has not been experiencing recurrent atrial fibrillation or palpitations.

## 2012-08-31 NOTE — Patient Instructions (Addendum)
Your physician recommends that you continue on your current medications as directed. Please refer to the Current Medication list given to you today.  Your physician recommends that you schedule a follow-up appointment in: 4 months with fasting labs (lp/bmet/hfp/cbc/tsh)

## 2012-08-31 NOTE — Progress Notes (Signed)
Ruben [name] Date of Birth:  July 17, 1950 [facility] [address] [place], [place]  [address] [phone]         Fax   [phone]  History of Present Illness: This pleasant 62 year old gentleman is seen for a four-month followup visit. 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 His most recent echocardiogram showed significant improvement of his left ventricular function back to an ejection fraction of 50-55%. In may 2012 he was successfully cardioverted. He is maintained on warfarin. He's had no recurrent atrial fib. He has been on low-dose amiodarone. He does complain of malaise and fatigue. His last visit we cut him back on his amiodarone. He still complains of easy fatigue. The patient also has sleep apnea and uses a CPAP machine.   Current Outpatient Prescriptions  Medication Sig Dispense Refill  . amiodarone (PACERONE) 200 MG tablet Take 0.5 tablets (100 mg total) by mouth every other day. Or as directed  90 tablet  1  . carvedilol (COREG) 6.25 MG tablet 1/2 TABLET TWICE A DAY  90 tablet  3  . hydrALAZINE (APRESOLINE) 25 MG tablet Take 0.5 tablets (12.5 mg total) by mouth 3 (three) times daily.  90 tablet  1  . lisinopril (PRINIVIL,ZESTRIL) 5 MG tablet Take 1 tablet (5 mg total) by mouth daily.  90 tablet  3  . pravastatin (PRAVACHOL) 20 MG tablet Per PCP Take 1 tablet daily      . warfarin (COUMADIN) 2 MG tablet Take as directed by Anticoagulation clinic  180 tablet  1   No current facility-administered medications for this visit.    No Known Allergies  Patient Active Problem List   Diagnosis Date Noted  . CARDIOMYOPATHY 06/05/2010    Priority: High  . Atrial fibrillation 06/05/2010    Priority: High  . Malaise and fatigue 03/30/2012  . Pure hypercholesterolemia 11/17/2010  . OSA (obstructive sleep apnea) 09/04/2010  . Cough 06/05/2010    History  Smoking status  . Never Smoker   Smokeless  tobacco  . Never Used    History  Alcohol Use  . Yes    Family History  Problem Relation Age of Onset  . Hypertension Mother     Review of Systems: Constitutional: no fever chills diaphoresis or fatigue or change in weight.  Head and neck: no hearing loss, no epistaxis, no photophobia or visual disturbance. Respiratory: No cough, shortness of breath or wheezing. Cardiovascular: No chest pain peripheral edema, palpitations. Gastrointestinal: No abdominal distention, no abdominal pain, no change in bowel habits hematochezia or melena. Genitourinary: No dysuria, no frequency, no urgency, no nocturia. Musculoskeletal:No arthralgias, no back pain, no gait disturbance or myalgias. Neurological: No dizziness, no headaches, no numbness, no seizures, no syncope, no weakness, no tremors. Hematologic: No lymphadenopathy, no easy bruising. Psychiatric: No confusion, no hallucinations, no sleep disturbance.    Physical Exam: Filed Vitals:   08/31/12 1454  BP: 120/72  Pulse: 51   the general appearance reveals a large gentleman in no distress.The head and neck exam reveals pupils equal and reactive.  Extraocular movements are full.  There is no scleral icterus.  The mouth and pharynx are normal.  The neck is supple.  The carotids reveal no bruits.  The jugular venous pressure is normal.  The  thyroid is not enlarged.  There is no lymphadenopathy.  The chest is clear to percussion and auscultation.  There are no rales or rhonchi.  Expansion of the  chest is symmetrical.  The precordium is quiet.  The first heart sound is normal.  The second heart sound is physiologically split.  There is no murmur gallop rub or click.  There is no abnormal lift or heave.  The abdomen is soft and nontender.  The bowel sounds are normal.  The liver and spleen are not enlarged.  There are no abdominal masses.  There are no abdominal bruits.  Extremities reveal good pedal pulses.  There is no phlebitis or edema.  There  is no cyanosis or clubbing.  Strength is normal and symmetrical in all extremities.  There is no lateralizing weakness.  There are no sensory deficits.  The skin is warm and dry.  There is no rash.  Today shows sinus bradycardia otherwise normal   Assessment / Plan: Continue on same medication.  He remains on a very low dose amiodarone 100 mg every other day and is maintaining normal sinus rhythm.  He will return in 4 months for office visit at that time and we'll check blood work to follow the amiodarone

## 2012-09-28 ENCOUNTER — Ambulatory Visit (INDEPENDENT_AMBULATORY_CARE_PROVIDER_SITE_OTHER): Admitting: *Deleted

## 2012-09-28 DIAGNOSIS — Z7901 Long term (current) use of anticoagulants: Secondary | ICD-10-CM

## 2012-09-28 DIAGNOSIS — I4891 Unspecified atrial fibrillation: Secondary | ICD-10-CM

## 2012-09-28 LAB — POCT INR: INR: 2

## 2012-10-26 ENCOUNTER — Encounter

## 2012-10-31 ENCOUNTER — Ambulatory Visit (INDEPENDENT_AMBULATORY_CARE_PROVIDER_SITE_OTHER): Admitting: *Deleted

## 2012-10-31 DIAGNOSIS — I4891 Unspecified atrial fibrillation: Secondary | ICD-10-CM

## 2012-10-31 DIAGNOSIS — Z7901 Long term (current) use of anticoagulants: Secondary | ICD-10-CM

## 2012-11-01 ENCOUNTER — Encounter

## 2012-11-08 ENCOUNTER — Other Ambulatory Visit: Payer: Self-pay | Admitting: Cardiology

## 2012-11-29 ENCOUNTER — Encounter: Payer: Self-pay | Admitting: Cardiology

## 2012-11-30 ENCOUNTER — Ambulatory Visit (INDEPENDENT_AMBULATORY_CARE_PROVIDER_SITE_OTHER): Admitting: Cardiology

## 2012-11-30 ENCOUNTER — Encounter: Payer: Self-pay | Admitting: Cardiology

## 2012-11-30 ENCOUNTER — Ambulatory Visit (INDEPENDENT_AMBULATORY_CARE_PROVIDER_SITE_OTHER): Admitting: Pharmacist

## 2012-11-30 VITALS — BP 122/78 | HR 85 | Ht 76.0 in | Wt 253.4 lb

## 2012-11-30 DIAGNOSIS — I4891 Unspecified atrial fibrillation: Secondary | ICD-10-CM

## 2012-11-30 DIAGNOSIS — I428 Other cardiomyopathies: Secondary | ICD-10-CM

## 2012-11-30 DIAGNOSIS — Z7901 Long term (current) use of anticoagulants: Secondary | ICD-10-CM

## 2012-11-30 DIAGNOSIS — E7800 Pure hypercholesterolemia, unspecified: Secondary | ICD-10-CM

## 2012-11-30 LAB — POCT INR: INR: 3

## 2012-11-30 NOTE — Progress Notes (Signed)
Ruben [name] Date of Birth:  01/05/1951 [facility] [address] [place], [place]  [address] [phone]         Fax   [phone]  History of Present Illness: This pleasant 62 year old gentleman is seen for a work in visit.  He comes in ahead of schedule because of concern that he may be back in atrial fibrillation. 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 His most recent echocardiogram showed significant improvement of his left ventricular function back to an ejection fraction of 50-55%. In may 2012 he was successfully cardioverted. He is maintained on warfarin. He's had no recurrent atrial fib. He has been on low-dose amiodarone. He does complain of malaise and fatigue. His last visit we cut him back on his amiodarone. He still complains of easy fatigue. The patient also has sleep apnea and uses a CPAP machine.  He went to see his PCP yesterday who did an electrocardiogram which showed atrial fib.   Current Outpatient Prescriptions  Medication Sig Dispense Refill  . amiodarone (PACERONE) 200 MG tablet Take 200 mg by mouth daily.       . carvedilol (COREG) 6.25 MG tablet 1/2 TABLET TWICE A DAY  90 tablet  3  . hydrALAZINE (APRESOLINE) 25 MG tablet Take 0.5 tablets (12.5 mg total) by mouth 3 (three) times daily.  90 tablet  1  . lisinopril (PRINIVIL,ZESTRIL) 5 MG tablet Take 1 tablet (5 mg total) by mouth daily.  90 tablet  3  . pravastatin (PRAVACHOL) 20 MG tablet Per PCP Take 1 tablet daily      . warfarin (COUMADIN) 2 MG tablet TAKE AS DIRECTED BY ANTIGOAGULATION CLINIC  180 tablet  1   No current facility-administered medications for this visit.    No Known Allergies  Patient Active Problem List   Diagnosis Date Noted  . CARDIOMYOPATHY 06/05/2010    Priority: High  . Atrial fibrillation 06/05/2010    Priority: High  . Malaise and fatigue 03/30/2012  . Pure hypercholesterolemia 11/17/2010  . OSA  (obstructive sleep apnea) 09/04/2010  . Cough 06/05/2010    History  Smoking status  . Never Smoker   Smokeless tobacco  . Never Used    History  Alcohol Use  . Yes    Family History  Problem Relation Age of Onset  . Hypertension Mother     Review of Systems: Constitutional: no fever chills diaphoresis or fatigue or change in weight.  Head and neck: no hearing loss, no epistaxis, no photophobia or visual disturbance. Respiratory: No cough, shortness of breath or wheezing. Cardiovascular: No chest pain peripheral edema, palpitations. Gastrointestinal: No abdominal distention, no abdominal pain, no change in bowel habits hematochezia or melena. Genitourinary: No dysuria, no frequency, no urgency, no nocturia. Musculoskeletal:No arthralgias, no back pain, no gait disturbance or myalgias. Neurological: No dizziness, no headaches, no numbness, no seizures, no syncope, no weakness, no tremors. Hematologic: No lymphadenopathy, no easy bruising. Psychiatric: No confusion, no hallucinations, no sleep disturbance.    Physical Exam: Filed Vitals:   11/30/12 1139  BP: 122/78  Pulse: 85   general appearance reveals a large gentleman in no distress.The head and neck exam reveals pupils equal and reactive.  Extraocular movements are full.  There is no scleral icterus.  The mouth and pharynx are normal.  The neck is supple.  The carotids reveal no bruits.  The jugular venous pressure is normal.  The  thyroid is not enlarged.  There is no lymphadenopathy.  The chest is clear to percussion and auscultation.  There are no rales or rhonchi.  Expansion of the chest is symmetrical.  The precordium is quiet.  The pulse is irregularly irregular  The first heart sound is normal.  The second heart sound is physiologically split.  There is no murmur gallop rub or click.  There is no abnormal lift or heave.  The abdomen is soft and nontender.  The bowel sounds are normal.  The liver and spleen are not  enlarged.  There are no abdominal masses.  There are no abdominal bruits.  Extremities reveal good pedal pulses.  There is no phlebitis or edema.  There is no cyanosis or clubbing.  Strength is normal and symmetrical in all extremities.  There is no lateralizing weakness.  There are no sensory deficits.  The skin is warm and dry.  There is no rash.   EKG confirms atrial fibrillation with controlled ventricular response, new since prior tracing  Assessment / Plan: For his atrial fibrillation we will increase his amiodarone back to a full 200 mg tablet daily.  We will see if this may enable him to convert back to sinus rhythm.  He will keep his appointment in mid September and at that point if he is still in atrial fibrillation we will talk about possible plans for cardioversion.  He may continue normal activities.  It is okay for him to travel.

## 2012-11-30 NOTE — Assessment & Plan Note (Signed)
It has been more than 2 years since he had direct current cardioversion for his atrial fibrillation.  He was down to taking just 100 mg of amiodarone every other day.  Yesterday he noticed that his pulse was faster than usual and went to see his PCP who did an EKG and confirmed recurrent atrial fib.  The patient may notice a slight amount of increased exertional dyspnea but otherwise no change in symptoms.

## 2012-11-30 NOTE — Assessment & Plan Note (Signed)
Patient remains on pravastatin for his hypercholesterolemia.  No myalgias.

## 2012-11-30 NOTE — Assessment & Plan Note (Signed)
The patient has not had recurrent symptoms of congestive heart failure.  His exercise tolerance remains good.

## 2012-11-30 NOTE — Patient Instructions (Signed)
INCREASE YOUR AMIODARONE TO 200 MG DAILY  Keep your September appointment

## 2012-12-02 ENCOUNTER — Encounter

## 2012-12-05 ENCOUNTER — Encounter

## 2012-12-12 ENCOUNTER — Other Ambulatory Visit: Payer: Self-pay | Admitting: Cardiology

## 2012-12-16 ENCOUNTER — Ambulatory Visit (INDEPENDENT_AMBULATORY_CARE_PROVIDER_SITE_OTHER)

## 2012-12-16 DIAGNOSIS — I4891 Unspecified atrial fibrillation: Secondary | ICD-10-CM

## 2012-12-16 DIAGNOSIS — Z7901 Long term (current) use of anticoagulants: Secondary | ICD-10-CM

## 2012-12-16 LAB — POCT INR: INR: 2.3

## 2012-12-30 ENCOUNTER — Other Ambulatory Visit (INDEPENDENT_AMBULATORY_CARE_PROVIDER_SITE_OTHER)

## 2012-12-30 DIAGNOSIS — I4891 Unspecified atrial fibrillation: Secondary | ICD-10-CM

## 2012-12-30 DIAGNOSIS — I480 Paroxysmal atrial fibrillation: Secondary | ICD-10-CM

## 2012-12-30 DIAGNOSIS — E7800 Pure hypercholesterolemia, unspecified: Secondary | ICD-10-CM

## 2012-12-30 LAB — CBC WITH DIFFERENTIAL/PLATELET
Basophils Relative: 0.6 % (ref 0.0–3.0)
Eosinophils Relative: 3.1 % (ref 0.0–5.0)
HCT: 40.3 % (ref 39.0–52.0)
Hemoglobin: 13.6 g/dL (ref 13.0–17.0)
Lymphs Abs: 1.8 10*3/uL (ref 0.7–4.0)
MCV: 93.6 fl (ref 78.0–100.0)
Monocytes Absolute: 0.9 10*3/uL (ref 0.1–1.0)
Monocytes Relative: 12.1 % — ABNORMAL HIGH (ref 3.0–12.0)
Neutro Abs: 4.2 10*3/uL (ref 1.4–7.7)
RBC: 4.31 Mil/uL (ref 4.22–5.81)
WBC: 7.1 10*3/uL (ref 4.5–10.5)

## 2012-12-30 LAB — HEPATIC FUNCTION PANEL
ALT: 19 U/L (ref 0–53)
Albumin: 3.8 g/dL (ref 3.5–5.2)
Bilirubin, Direct: 0 mg/dL (ref 0.0–0.3)
Total Protein: 6.8 g/dL (ref 6.0–8.3)

## 2012-12-30 LAB — LIPID PANEL
HDL: 38.3 mg/dL — ABNORMAL LOW (ref >39.00)
Triglycerides: 324 mg/dL — ABNORMAL HIGH (ref 0.0–149.0)
VLDL: 64.8 mg/dL — ABNORMAL HIGH (ref 0.0–40.0)

## 2012-12-30 LAB — BASIC METABOLIC PANEL
BUN: 24 mg/dL — ABNORMAL HIGH (ref 6–23)
Chloride: 104 mEq/L (ref 96–112)
GFR: 58.96 mL/min — ABNORMAL LOW (ref >60.00)
Potassium: 3.4 mEq/L — ABNORMAL LOW (ref 3.5–5.1)
Sodium: 134 mEq/L — ABNORMAL LOW (ref 135–145)

## 2012-12-30 LAB — TSH: TSH: 2.59 u[IU]/mL (ref 0.35–5.50)

## 2012-12-30 NOTE — Progress Notes (Signed)
Quick Note:  Please make copy of labs for patient visit. ______ 

## 2013-01-04 ENCOUNTER — Encounter: Payer: Self-pay | Admitting: Cardiology

## 2013-01-04 ENCOUNTER — Ambulatory Visit (INDEPENDENT_AMBULATORY_CARE_PROVIDER_SITE_OTHER): Admitting: Cardiology

## 2013-01-04 ENCOUNTER — Ambulatory Visit (INDEPENDENT_AMBULATORY_CARE_PROVIDER_SITE_OTHER): Admitting: *Deleted

## 2013-01-04 ENCOUNTER — Ambulatory Visit: Admitting: Cardiology

## 2013-01-04 VITALS — BP 110/70 | HR 74 | Ht 76.0 in | Wt 256.0 lb

## 2013-01-04 DIAGNOSIS — I4891 Unspecified atrial fibrillation: Secondary | ICD-10-CM

## 2013-01-04 DIAGNOSIS — E7800 Pure hypercholesterolemia, unspecified: Secondary | ICD-10-CM

## 2013-01-04 DIAGNOSIS — Z7901 Long term (current) use of anticoagulants: Secondary | ICD-10-CM

## 2013-01-04 DIAGNOSIS — I428 Other cardiomyopathies: Secondary | ICD-10-CM

## 2013-01-04 NOTE — Assessment & Plan Note (Signed)
We reviewed his recent lipids which are generally satisfactory.  His weight is up 3 pounds since last visit.  He has been on several recent business trips out of state and this contributes to his weight gain

## 2013-01-04 NOTE — Patient Instructions (Addendum)
Your physician recommends that you continue on your current medications as directed. Please refer to the Current Medication list given to you today.  Your physician recommends that you schedule a follow-up appointment in: 3 MONTH OV/EKG  Your physician has requested that you have an echocardiogram. Echocardiography is a painless test that uses sound waves to create images of your heart. It provides your doctor with information about the size and shape of your heart and how well your heart's chambers and valves are working. This procedure takes approximately one hour. There are no restrictions for this procedure.

## 2013-01-04 NOTE — Assessment & Plan Note (Signed)
The patient has not had any orthopnea or paroxysmal nocturnal dyspnea.  No pedal edema

## 2013-01-04 NOTE — Progress Notes (Signed)
Ruben [name] Date of Birth:  11-01-1950 [facility] [address] [place], [place]  [address] [phone]         Fax   [phone]  History of Present Illness: This pleasant 62 year old gentleman is seen for a followup office visit.  This is in followup to his recent appointment at which time he was found that he was back in atrial fibrillation. 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 His most recent echocardiogram was in May 2012 showed significant improvement of his left ventricular function back to an ejection fraction of 50-55%.  There was grade 1 diastolic dysfunction. In may 2012 he was successfully cardioverted. He is maintained on warfarin.  The patient went back into atrial fibrillation in August 2014.  His amiodarone which had been cut way back was increased back up to a dose of 200 mg daily for the past month.  The patient returns today and is still in atrial fibrillation. The patient also has sleep apnea and uses a CPAP machine.     Current Outpatient Prescriptions  Medication Sig Dispense Refill  . amiodarone (PACERONE) 200 MG tablet Take 200 mg by mouth daily.       . carvedilol (COREG) 6.25 MG tablet 1/2 TABLET TWICE A DAY  90 tablet  3  . hydrALAZINE (APRESOLINE) 25 MG tablet TAKE ONE-HALF (1/2) TABLET (12.5 MG TOTAL) THREE TIMES A DAY  90 tablet  0  . lisinopril (PRINIVIL,ZESTRIL) 5 MG tablet Take 1 tablet (5 mg total) by mouth daily.  90 tablet  3  . pravastatin (PRAVACHOL) 20 MG tablet Per PCP Take 1 tablet daily      . warfarin (COUMADIN) 2 MG tablet TAKE AS DIRECTED BY ANTIGOAGULATION CLINIC  180 tablet  1   No current facility-administered medications for this visit.    No Known Allergies  Patient Active Problem List   Diagnosis Date Noted  . CARDIOMYOPATHY 06/05/2010    Priority: High  . Atrial fibrillation 06/05/2010    Priority: High  . Malaise and fatigue 03/30/2012  . Pure  hypercholesterolemia 11/17/2010  . OSA (obstructive sleep apnea) 09/04/2010  . Cough 06/05/2010    History  Smoking status  . Never Smoker   Smokeless tobacco  . Never Used    History  Alcohol Use  . Yes    Family History  Problem Relation Age of Onset  . Hypertension Mother     Review of Systems: Constitutional: no fever chills diaphoresis or fatigue or change in weight.  Head and neck: no hearing loss, no epistaxis, no photophobia or visual disturbance. Respiratory: No cough, shortness of breath or wheezing. Cardiovascular: No chest pain peripheral edema, palpitations. Gastrointestinal: No abdominal distention, no abdominal pain, no change in bowel habits hematochezia or melena. Genitourinary: No dysuria, no frequency, no urgency, no nocturia. Musculoskeletal:No arthralgias, no back pain, no gait disturbance or myalgias. Neurological: No dizziness, no headaches, no numbness, no seizures, no syncope, no weakness, no tremors. Hematologic: No lymphadenopathy, no easy bruising. Psychiatric: No confusion, no hallucinations, no sleep disturbance.    Physical Exam: Filed Vitals:   01/04/13 1518  BP: 110/70  Pulse: 74   general appearance reveals a large gentleman in no distress.The head and neck exam reveals pupils equal and reactive.  Extraocular movements are full.  There is no scleral icterus.  The mouth and pharynx are normal.  The neck is supple.  The carotids reveal no bruits.  The jugular  venous pressure is normal.  The  thyroid is not enlarged.  There is no lymphadenopathy.  The chest is clear to percussion and auscultation.  There are no rales or rhonchi.  Expansion of the chest is symmetrical.  The precordium is quiet.  The pulse is irregularly irregular  The first heart sound is normal.  The second heart sound is physiologically split.  There is no murmur gallop rub or click.  There is no abnormal lift or heave.  The abdomen is soft and nontender.  The bowel sounds are  normal.  The liver and spleen are not enlarged.  There are no abdominal masses.  There are no abdominal bruits.  Extremities reveal good pedal pulses.  There is no phlebitis or edema.  There is no cyanosis or clubbing.  Strength is normal and symmetrical in all extremities.  There is no lateralizing weakness.  There are no sensory deficits.  The skin is warm and dry.  There is no rash.   EKG confirms atrial fibrillation with controlled ventricular response, no significant change since 11/30/12.  Assessment / Plan: The patient remains in atrial fibrillation with controlled ventricular response.  We will get an echocardiogram.  Following his echo we will call him with results and discuss possible proceeding with outpatient elective cardioversion. Patient will be rechecked in 3 months for followup office visit and EKG. Continue current medication.  His thyroid function is normal on amiodarone.

## 2013-01-04 NOTE — Assessment & Plan Note (Signed)
We talked today about the options of rate control versus rhythm control.  He does note some decrease in exercise tolerance since he is back in atrial fibrillation when compared with how he feels when he is in normal sinus rhythm.  Since he remained in normal sinus rhythm for more than 2 years following his first and only electrical cardioversion, I would favor another attempt at cardioversion.  He is leaning and neck traction also we both agree it would be nice to see what his present left ventricular ejection fraction is.  We will schedule echocardiogram and then talk to him following that about whether to schedule cardioversion or not.  In the meantime he will remain on current dose of amiodarone 200 mg daily and he remains on long-term anticoagulation with warfarin.

## 2013-01-16 ENCOUNTER — Ambulatory Visit (HOSPITAL_COMMUNITY): Attending: Cardiology

## 2013-01-16 DIAGNOSIS — R5381 Other malaise: Secondary | ICD-10-CM | POA: Insufficient documentation

## 2013-01-16 DIAGNOSIS — I4891 Unspecified atrial fibrillation: Secondary | ICD-10-CM | POA: Insufficient documentation

## 2013-01-16 DIAGNOSIS — I059 Rheumatic mitral valve disease, unspecified: Secondary | ICD-10-CM | POA: Insufficient documentation

## 2013-01-16 DIAGNOSIS — I428 Other cardiomyopathies: Secondary | ICD-10-CM

## 2013-01-16 DIAGNOSIS — G4730 Sleep apnea, unspecified: Secondary | ICD-10-CM | POA: Insufficient documentation

## 2013-01-16 DIAGNOSIS — E785 Hyperlipidemia, unspecified: Secondary | ICD-10-CM | POA: Insufficient documentation

## 2013-01-16 NOTE — Progress Notes (Signed)
Echocardiogram performed.  

## 2013-01-20 ENCOUNTER — Telehealth: Payer: Self-pay | Admitting: Cardiology

## 2013-01-20 NOTE — Telephone Encounter (Deleted)
Error

## 2013-01-22 ENCOUNTER — Other Ambulatory Visit: Payer: Self-pay | Admitting: Cardiology

## 2013-01-23 NOTE — Telephone Encounter (Signed)
New problem   Pt need to know if it's ok for him to cleaning and possible dental work. Please call pt.

## 2013-01-23 NOTE — Telephone Encounter (Signed)
Pt advised.

## 2013-01-23 NOTE — Telephone Encounter (Signed)
Agree with advice given

## 2013-01-23 NOTE — Telephone Encounter (Signed)
Pt advised cleaning should be OK as long as dentist is aware pt is on warfarin and he should not stop/hold warfarin pending cardioversion 02/22/13.

## 2013-01-26 ENCOUNTER — Encounter: Payer: Self-pay | Admitting: *Deleted

## 2013-01-27 ENCOUNTER — Encounter: Payer: Self-pay | Admitting: Cardiology

## 2013-01-30 ENCOUNTER — Telehealth: Payer: Self-pay | Admitting: Cardiology

## 2013-01-30 NOTE — Telephone Encounter (Signed)
Received call from Holly/Dr. Kathyrn Sheriff office stating pt needs to have a lot of dental work done in near furture. Especially needs to have tooth on lower side extracted or root canal.  Understands that he can not stop Coumadin prior to his cardioversion on 11/5 but wants to know if OK to start him on antibiotics now.  Advised that Dr. Patty Sermons is out of office till [facility] 10/16.  She would like to get answer today if possible regarding starting antibiotics.  Spoke w/Lori Gerhardt,NP who states he can start antibiotic but needs to have INR checked Wed or Thurs. Darl Householder that she may start antibiotic but he needs to keep scheduled app on Wed to have INR checked since antibiobic may change dose of coumadin.  Jeanice Lim will call back on Monday 10/20 to speak w/Melinda regarding further treatment.  States they will be out of office Thurs and Fri this week.  Will send message to Juliette Alcide so she is aware of situation.

## 2013-01-30 NOTE — Telephone Encounter (Signed)
New problem   Need clearance for pt to have dental work and ok to take antibiotic.Please advise.

## 2013-02-01 ENCOUNTER — Ambulatory Visit (INDEPENDENT_AMBULATORY_CARE_PROVIDER_SITE_OTHER): Admitting: *Deleted

## 2013-02-01 DIAGNOSIS — I4891 Unspecified atrial fibrillation: Secondary | ICD-10-CM

## 2013-02-01 DIAGNOSIS — Z7901 Long term (current) use of anticoagulants: Secondary | ICD-10-CM

## 2013-02-01 LAB — POCT INR: INR: 3.6

## 2013-02-02 NOTE — Telephone Encounter (Signed)
Agree with plan 

## 2013-02-06 ENCOUNTER — Telehealth: Payer: Self-pay | Admitting: Cardiology

## 2013-02-06 NOTE — Telephone Encounter (Signed)
New problem   Need cardiac clearance for pt to have dental procedure. Please advise by phone call.

## 2013-02-06 NOTE — Telephone Encounter (Signed)
Patient is going to have extensive dental work and is currently on an antibiotic for infection. He is going to have to either have extraction or a root canal after the cardioversion. The dental office is needing clearance, how soon after, what kind of local anesthesia can be used, and how long to leave off warfarin prior to extraction. Will forward to  Dr. Patty Sermons for review.

## 2013-02-06 NOTE — Telephone Encounter (Signed)
After his cardioversion if possible I would not want him to have to be off his warfarin until at least 3 weeks at the minimum after the procedure.  He would need to hold the warfarin for 3 days prior to the procedure. Alternatively, if the patient is in severe discomfort and distress from his tooth we may wish to have him proceed with the needed tooth work now and postpone the elective cardioversion until after the dental work.  It may not be practical to keep him on antibiotics for the next month or so while waiting for dental surgery.

## 2013-02-07 ENCOUNTER — Ambulatory Visit (INDEPENDENT_AMBULATORY_CARE_PROVIDER_SITE_OTHER): Admitting: *Deleted

## 2013-02-07 DIAGNOSIS — Z7901 Long term (current) use of anticoagulants: Secondary | ICD-10-CM

## 2013-02-07 DIAGNOSIS — I4891 Unspecified atrial fibrillation: Secondary | ICD-10-CM

## 2013-02-07 LAB — POCT INR: INR: 3.4

## 2013-02-07 NOTE — Telephone Encounter (Signed)
[facility]. Will have  Dr. Patty Sermons dictate in post cardioversion ov patient cleared and plan. Will send to dental office.

## 2013-02-09 ENCOUNTER — Telehealth: Payer: Self-pay | Admitting: Cardiology

## 2013-02-09 NOTE — Telephone Encounter (Signed)
Spoke with [facility] at Dr. Hassie Bruce office who wanted to make sure it is okay for patient to take 12 more days of antibiotics for tooth pain.  I advised Jeanice Lim that I will notify Dr. Patty Sermons but the antiobiotics should not interfere with the cardioversion that is scheduled for 11/5.  Holly verbalized understanding and gratitude.

## 2013-02-09 NOTE — Telephone Encounter (Signed)
New problem   Want to know if pt can continue taking antibiotic for 12 days. Please advise

## 2013-02-09 NOTE — Telephone Encounter (Signed)
Routed to Dr. Jonn Shingles

## 2013-02-09 NOTE — Telephone Encounter (Signed)
Okay to continue the antibiotics for another 12 days.

## 2013-02-13 ENCOUNTER — Telehealth: Payer: Self-pay | Admitting: Cardiology

## 2013-02-13 NOTE — Telephone Encounter (Signed)
New Problem  Pt requests to have the [facility]END[facility] appt rescheduled/// requests a call back

## 2013-02-13 NOTE — Telephone Encounter (Signed)
Patient needed to rescheduled cardioversion. Rescheduled for 03/02/13 at 10:00.

## 2013-02-17 ENCOUNTER — Ambulatory Visit: Admitting: Cardiology

## 2013-02-17 ENCOUNTER — Ambulatory Visit (INDEPENDENT_AMBULATORY_CARE_PROVIDER_SITE_OTHER): Payer: TRICARE For Life (TFL) | Admitting: *Deleted

## 2013-02-17 DIAGNOSIS — I4891 Unspecified atrial fibrillation: Secondary | ICD-10-CM

## 2013-02-17 DIAGNOSIS — Z7901 Long term (current) use of anticoagulants: Secondary | ICD-10-CM

## 2013-02-21 ENCOUNTER — Telehealth: Payer: Self-pay | Admitting: Pulmonary Disease

## 2013-02-21 DIAGNOSIS — G4733 Obstructive sleep apnea (adult) (pediatric): Secondary | ICD-10-CM

## 2013-02-21 NOTE — Telephone Encounter (Signed)
I spoke with pt and aware will send order to DME. He will keep pending appt. Staff message also sent to [facility]. Nothing further needed

## 2013-02-24 ENCOUNTER — Ambulatory Visit (INDEPENDENT_AMBULATORY_CARE_PROVIDER_SITE_OTHER): Admitting: *Deleted

## 2013-02-24 DIAGNOSIS — Z7901 Long term (current) use of anticoagulants: Secondary | ICD-10-CM

## 2013-02-24 DIAGNOSIS — I4891 Unspecified atrial fibrillation: Secondary | ICD-10-CM

## 2013-03-01 ENCOUNTER — Encounter: Payer: Self-pay | Admitting: Cardiology

## 2013-03-01 ENCOUNTER — Ambulatory Visit (INDEPENDENT_AMBULATORY_CARE_PROVIDER_SITE_OTHER): Admitting: Cardiology

## 2013-03-01 VITALS — BP 122/70 | Ht 76.0 in | Wt 252.0 lb

## 2013-03-01 DIAGNOSIS — G4733 Obstructive sleep apnea (adult) (pediatric): Secondary | ICD-10-CM

## 2013-03-01 DIAGNOSIS — E7800 Pure hypercholesterolemia, unspecified: Secondary | ICD-10-CM

## 2013-03-01 DIAGNOSIS — I428 Other cardiomyopathies: Secondary | ICD-10-CM

## 2013-03-01 DIAGNOSIS — I4891 Unspecified atrial fibrillation: Secondary | ICD-10-CM

## 2013-03-01 LAB — CBC WITH DIFFERENTIAL/PLATELET
Basophils Absolute: 0 10*3/uL (ref 0.0–0.1)
Basophils Relative: 0.8 % (ref 0.0–3.0)
Eosinophils Absolute: 0.2 10*3/uL (ref 0.0–0.7)
Eosinophils Relative: 3.9 % (ref 0.0–5.0)
HCT: 41.6 % (ref 39.0–52.0)
Hemoglobin: 14 g/dL (ref 13.0–17.0)
MCHC: 33.6 g/dL (ref 30.0–36.0)
MCV: 93.7 fl (ref 78.0–100.0)
Monocytes Absolute: 0.5 10*3/uL (ref 0.1–1.0)
Neutro Abs: 3.6 10*3/uL (ref 1.4–7.7)
Neutrophils Relative %: 59.2 % (ref 43.0–77.0)
RBC: 4.44 Mil/uL (ref 4.22–5.81)
WBC: 6.1 10*3/uL (ref 4.5–10.5)

## 2013-03-01 LAB — BASIC METABOLIC PANEL
BUN: 17 mg/dL (ref 6–23)
Calcium: 9 mg/dL (ref 8.4–10.5)
Chloride: 103 mEq/L (ref 96–112)
Creatinine, Ser: 1.3 mg/dL (ref 0.4–1.5)
GFR: 61.07 mL/min (ref >60.00)
Glucose, Bld: 93 mg/dL (ref 70–99)
Potassium: 4.1 mEq/L (ref 3.5–5.1)

## 2013-03-01 NOTE — Assessment & Plan Note (Signed)
The patient is not having any symptoms of congestive heart failure.  He is engaged in ordinary activities.  He avoids lifting more than 50 pounds.

## 2013-03-01 NOTE — Assessment & Plan Note (Signed)
EKG today confirms atrial fibrillation with controlled ventricular response.  Cardioversion is planned for 03/02/13

## 2013-03-01 NOTE — Progress Notes (Signed)
Ruben [name] Date of Birth:  04/13/1951 [address] [place], [place]  [address] [phone]         Fax   [phone]  History of Present Illness: This pleasant 62 year old gentleman is seen for a followup office visit.  At a recent appointment  he was found that he was back in atrial fibrillation. 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 His most recent echocardiogram was in 01/16/13 showed significant improvement of his left ventricular function back to an ejection fraction of 50-55%.   In may 2012 he was successfully cardioverted. He is maintained on warfarin.  The patient went back into atrial fibrillation in August 2014.  His amiodarone which had been cut way back was increased back up to a dose of 200 mg daily for the past 2 months.  The patient returns today and is still in atrial fibrillation. The patient also has sleep apnea and uses a CPAP machine.  He is scheduled for elective direct-current cardioversion tomorrow morning 03/02/13 at 10 AM.   Current Outpatient Prescriptions  Medication Sig Dispense Refill  . amiodarone (PACERONE) 200 MG tablet Take 200 mg by mouth daily.       . carvedilol (COREG) 6.25 MG tablet 1/2 TABLET TWICE A DAY  90 tablet  3  . hydrALAZINE (APRESOLINE) 25 MG tablet TAKE ONE-HALF TABLET (12.5 MG TOTAL) THREE TIMES A DAY  90 tablet  1  . lisinopril (PRINIVIL,ZESTRIL) 5 MG tablet Take 1 tablet (5 mg total) by mouth daily.  90 tablet  3  . pravastatin (PRAVACHOL) 20 MG tablet Per PCP Take 1 tablet daily      . warfarin (COUMADIN) 2 MG tablet TAKE AS DIRECTED BY ANTIGOAGULATION CLINIC  180 tablet  1   No current facility-administered medications for this visit.    No Known Allergies  Patient Active Problem List   Diagnosis Date Noted  . CARDIOMYOPATHY 06/05/2010    Priority: High  . Atrial fibrillation 06/05/2010    Priority: High  . Malaise and fatigue 03/30/2012  . Pure  hypercholesterolemia 11/17/2010  . OSA (obstructive sleep apnea) 09/04/2010  . Cough 06/05/2010    History  Smoking status  . Never Smoker   Smokeless tobacco  . Never Used    History  Alcohol Use  . Yes    Family History  Problem Relation Age of Onset  . Hypertension Mother     Review of Systems: Constitutional: no fever chills diaphoresis or fatigue or change in weight.  Head and neck: no hearing loss, no epistaxis, no photophobia or visual disturbance. Respiratory: No cough, shortness of breath or wheezing. Cardiovascular: No chest pain peripheral edema, palpitations. Gastrointestinal: No abdominal distention, no abdominal pain, no change in bowel habits hematochezia or melena. Genitourinary: No dysuria, no frequency, no urgency, no nocturia. Musculoskeletal:No arthralgias, no back pain, no gait disturbance or myalgias. Neurological: No dizziness, no headaches, no numbness, no seizures, no syncope, no weakness, no tremors. Hematologic: No lymphadenopathy, no easy bruising. Psychiatric: No confusion, no hallucinations, no sleep disturbance.    Physical Exam: Filed Vitals:   03/01/13 1150  BP: 122/70   general appearance reveals a large gentleman in no distress.The head and neck exam reveals pupils equal and reactive.  Extraocular movements are full.  There is no scleral icterus.  The mouth and pharynx are normal.  The neck is supple.  The carotids reveal no bruits.  The jugular venous pressure is normal.  The  thyroid is not enlarged.  There is no lymphadenopathy.  The chest is clear to percussion and auscultation.  There are no rales or rhonchi.  Expansion of the chest is symmetrical.  The precordium is quiet.  The pulse is irregularly irregular  The first heart sound is normal.  The second heart sound is physiologically split.  There is no murmur gallop rub or click.  There is no abnormal lift or heave.  The abdomen is soft and nontender.  The bowel sounds are normal.  The  liver and spleen are not enlarged.  There are no abdominal masses.  There are no abdominal bruits.  Extremities reveal good pedal pulses.  There is no phlebitis or edema.  There is no cyanosis or clubbing.  Strength is normal and symmetrical in all extremities.  There is no lateralizing weakness.  There are no sensory deficits.  The skin is warm and dry.  There is no rash.   EKG confirms atrial fibrillation with controlled ventricular response, no significant change since last tracing.  Assessment / Plan: The patient was given these instructions today for his cardioversion.  He will come to this office at 7:30 in the morning for a followup INR.  He will go to [place] at 8:30 for his cardioversion which is scheduled for 10 AM.  He will be n.p.o. except he will take his amiodarone and his carvedilol tomorrow morning with a small sip of water Continue current medication.  His thyroid function is normal on amiodarone.

## 2013-03-01 NOTE — Progress Notes (Signed)
Quick Note:  Please report to patient. The recent labs are stable. Continue same medication and careful diet. Labs okay for cardioversion. INR to be done 11/13 am ______

## 2013-03-01 NOTE — Assessment & Plan Note (Signed)
Patient has obstructive sleep apnea and uses a CPAP machine.  This is followed by Dr. Craige Cotta.

## 2013-03-01 NOTE — Patient Instructions (Addendum)
Your physician recommends that you have lab work today: CBCD/BMET You will have your INR checked at coumadin clinic in the morning 11/13 (before cardioversion)   You have your cardioversion scheduled for tomorrow 11/13. Arrive at the hospital at 8:30 am. You may take your Amiodarone and Carvedilol with a sip of water in the morning.  Your physician recommends that you continue on your current medications as directed. Please refer to the Current Medication list given to you today.

## 2013-03-02 ENCOUNTER — Ambulatory Visit (INDEPENDENT_AMBULATORY_CARE_PROVIDER_SITE_OTHER): Admitting: *Deleted

## 2013-03-02 ENCOUNTER — Ambulatory Visit (HOSPITAL_COMMUNITY): Admitting: Certified Registered"

## 2013-03-02 ENCOUNTER — Ambulatory Visit: Admitting: Pulmonary Disease

## 2013-03-02 ENCOUNTER — Encounter (HOSPITAL_COMMUNITY): Admitting: Certified Registered"

## 2013-03-02 ENCOUNTER — Ambulatory Visit (HOSPITAL_COMMUNITY)
Admission: RE | Admit: 2013-03-02 | Discharge: 2013-03-02 | Disposition: A | Discharge status: Home / Self Care | Source: Ambulatory Visit | Attending: Cardiology | Admitting: Cardiology

## 2013-03-02 ENCOUNTER — Encounter (HOSPITAL_COMMUNITY): Payer: Self-pay | Admitting: Certified Registered"

## 2013-03-02 ENCOUNTER — Encounter (HOSPITAL_COMMUNITY): Admission: RE | Disposition: A | Discharge status: Home / Self Care | Source: Ambulatory Visit | Attending: Cardiology

## 2013-03-02 DIAGNOSIS — I4891 Unspecified atrial fibrillation: Secondary | ICD-10-CM

## 2013-03-02 DIAGNOSIS — Z7901 Long term (current) use of anticoagulants: Secondary | ICD-10-CM

## 2013-03-02 DIAGNOSIS — I10 Essential (primary) hypertension: Secondary | ICD-10-CM | POA: Insufficient documentation

## 2013-03-02 DIAGNOSIS — G4730 Sleep apnea, unspecified: Secondary | ICD-10-CM | POA: Insufficient documentation

## 2013-03-02 DIAGNOSIS — E669 Obesity, unspecified: Secondary | ICD-10-CM | POA: Insufficient documentation

## 2013-03-02 HISTORY — PX: CARDIOVERSION: SHX1299

## 2013-03-02 LAB — POCT INR: INR: 2.7

## 2013-03-02 SURGERY — CARDIOVERSION
Anesthesia: General

## 2013-03-02 MED ORDER — SODIUM CHLORIDE 0.9 % IV SOLN
INTRAVENOUS | Status: DC
  Administered 2013-03-02: 09:00:00 via INTRAVENOUS

## 2013-03-02 MED ORDER — PROPOFOL 10 MG/ML IV BOLUS
INTRAVENOUS | Status: DC | PRN
  Administered 2013-03-02: 70 mg via INTRAVENOUS

## 2013-03-02 NOTE — Anesthesia Preprocedure Evaluation (Addendum)
Anesthesia Evaluation  Patient identified by MRN, date of birth, ID band Patient awake    Reviewed: Allergy & Precautions, H&P , NPO status , Patient's Chart, lab work & pertinent test results, reviewed documented beta blocker date and time   History of Anesthesia Complications Negative for: history of anesthetic complications  Airway Mallampati: II TM Distance: >3 FB Neck ROM: Full    Dental  (+) Dental Advisory Given   Pulmonary sleep apnea and Continuous Positive Airway Pressure Ventilation ,  breath sounds clear to auscultation  Pulmonary exam normal       Cardiovascular hypertension, Pt. on medications and Pt. on home beta blockers - angina+ dysrhythmias (coumadin) Atrial Fibrillation Rhythm:Irregular Rate:Normal  9/14 ECHO: EF 50-55%, mild MR   Neuro/Psych    GI/Hepatic negative GI ROS, Neg liver ROS,   Endo/Other  negative endocrine ROS  Renal/GU negative Renal ROS     Musculoskeletal   Abdominal (+) + obese,   Peds  Hematology   Anesthesia Other Findings   Reproductive/Obstetrics                         Anesthesia Physical Anesthesia Plan  ASA: III  Anesthesia Plan: General   Post-op Pain Management:    Induction: Intravenous  Airway Management Planned: Mask  Additional Equipment:   Intra-op Plan:   Post-operative Plan:   Informed Consent: I have reviewed the patients History and Physical, chart, labs and discussed the procedure including the risks, benefits and alternatives for the proposed anesthesia with the patient or authorized representative who has indicated his/her understanding and acceptance.   Dental advisory given  Plan Discussed with: Anesthesiologist, Surgeon and CRNA  Anesthesia Plan Comments: (Plan routine monitors, GA for cardioversion)       Anesthesia Quick Evaluation

## 2013-03-02 NOTE — Anesthesia Postprocedure Evaluation (Signed)
  Anesthesia Post-op Note  Patient: Ruben [name]  Procedure(s) Performed: Procedure(s): CARDIOVERSION (N/A)  Patient Location: Endoscopy Unit  Anesthesia Type:General  Level of Consciousness: awake, alert , oriented and patient cooperative  Airway and Oxygen Therapy: Patient Spontanous Breathing and Patient connected to nasal cannula oxygen  Post-op Pain: none  Post-op Assessment: Post-op Vital signs reviewed, Patient's Cardiovascular Status Stable, Respiratory Function Stable, Patent Airway and No signs of Nausea or vomiting  Post-op Vital Signs: Reviewed and stable  Complications: No apparent anesthesia complications

## 2013-03-02 NOTE — CV Procedure (Signed)
Electrical Cardioversion Procedure Note Ruben [name] [id number] 06-17-1950  Procedure: Electrical Cardioversion Indications:  Atrial Fibrillation  Procedure Details Consent: Risks of procedure as well as the alternatives and risks of each were explained to the (patient/caregiver).  Consent for procedure obtained. Time Out: Verified patient identification, verified procedure, site/side was marked, verified correct patient position, special equipment/implants available, medications/allergies/relevent history reviewed, required imaging and test results available.  Performed  Patient placed on cardiac monitor, pulse oximetry, supplemental oxygen as necessary.  Sedation given: propofol 70 mg Pacer pads placed anterior and posterior chest.  Cardioverted 2 time(s).  Cardioverted at 150J.  Evaluation Findings: Post procedure EKG shows: NSR Complications: None Patient did tolerate procedure well.   [name] 03/02/2013, 10:22 AM

## 2013-03-02 NOTE — Transfer of Care (Signed)
Immediate Anesthesia Transfer of Care Note  Patient: Ruben [name]  Procedure(s) Performed: Procedure(s): CARDIOVERSION (N/A)  Patient Location: Endoscopy Unit  Anesthesia Type:General  Level of Consciousness: awake, alert , oriented and patient cooperative  Airway & Oxygen Therapy: Patient Spontanous Breathing and Patient connected to nasal cannula oxygen  Post-op Assessment: Report given to PACU RN, Post -op Vital signs reviewed and stable and Patient moving all extremities  Post vital signs: Reviewed and stable  Complications: No apparent anesthesia complications

## 2013-03-02 NOTE — H&P (Signed)
This pleasant 62 year old gentleman is seen for a followup office visit. At a recent appointment he was found that he was back in atrial fibrillation. 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 His most recent echocardiogram was in 01/16/13 showed significant improvement of his left ventricular function back to an ejection fraction of 50-55%. In may 2012 he was successfully cardioverted. He is maintained on warfarin. The patient went back into atrial fibrillation in August 2014. His amiodarone which had been cut way back was increased back up to a dose of 200 mg daily for the past 2 months. The patient returns today and is still in atrial fibrillation. The patient also has sleep apnea and uses a CPAP machine. He is scheduled for elective direct-current cardioversion today 03/02/13 at 10 AM. His INR this morning is satisfactory at 2.7

## 2013-03-03 ENCOUNTER — Encounter (HOSPITAL_COMMUNITY): Payer: Self-pay | Admitting: Cardiology

## 2013-03-09 ENCOUNTER — Ambulatory Visit (INDEPENDENT_AMBULATORY_CARE_PROVIDER_SITE_OTHER): Admitting: *Deleted

## 2013-03-09 DIAGNOSIS — I4891 Unspecified atrial fibrillation: Secondary | ICD-10-CM

## 2013-03-09 DIAGNOSIS — Z7901 Long term (current) use of anticoagulants: Secondary | ICD-10-CM

## 2013-03-13 ENCOUNTER — Ambulatory Visit (INDEPENDENT_AMBULATORY_CARE_PROVIDER_SITE_OTHER): Admitting: Pulmonary Disease

## 2013-03-13 ENCOUNTER — Encounter: Payer: Self-pay | Admitting: Pulmonary Disease

## 2013-03-13 VITALS — BP 102/60 | HR 50 | Ht 76.0 in | Wt 254.0 lb

## 2013-03-13 DIAGNOSIS — G4733 Obstructive sleep apnea (adult) (pediatric): Secondary | ICD-10-CM

## 2013-03-13 NOTE — Assessment & Plan Note (Signed)
He is compliant with therapy, and reports benefit from CPAP.  He had recent cardioversion for a fib.  Will check his download to ensure he does not change to his CPAP set up.

## 2013-03-13 NOTE — Patient Instructions (Signed)
Will get CPAP download and call with results Follow up in 1 year 

## 2013-03-13 NOTE — Progress Notes (Signed)
Chief Complaint  Patient presents with  . Sleep Apnea    Using CPAP every night for about 6 hours. Does need new supplies, they should be coming in today. Denies problems wth machine, mask or pressure.    History of Present Illness: Ruben [name] is a 62 y.o. male with OSA.  He had cardioversion on 03/02/13.  He has been doing well with CPAP.  He needs new supplies.  He feels CPAP helps his sleep.  He is not having any trouble with his mask.   TESTS: PFT 06/25/10>>FEV1 2.94(77%), FEV1% 73, TLC 6.75(83%), DLCO 70%, no BD  PSG 08/21/10>>AHI 12.5, RDI 24.9, SpO2 low 89%  Auto CPAP 08/14/11 to 09/09/11>>Used on 27 of 27 nights with average 5 hrs 17 min. Average AHI 4.3 with median CPAP 9 cm H2O and 95th percentile CPAP 11 cm H2O.   Ruben [name]  has a past medical history of Non-ischemic cardiomyopathy; Atrial fibrillation; OSA (obstructive sleep apnea); and Cough.  Ruben [name]  has past surgical history that includes Foot fracture surgery; Pilonidal cyst excision; and Cardioversion (N/A, 03/02/2013).  Prior to Admission medications   Medication Sig Start Date End Date Taking? Authorizing Provider  amiodarone (PACERONE) 200 MG tablet Take 200 mg by mouth daily.  05/16/12 05/16/13 Yes [name], MD  carvedilol (COREG) 6.25 MG tablet 1/2 TABLET TWICE A DAY 07/01/12  Yes [name], MD  hydrALAZINE (APRESOLINE) 25 MG tablet TAKE ONE-HALF TABLET (12.5 MG TOTAL) THREE TIMES A DAY 01/22/13  Yes [name], MD  lisinopril (PRINIVIL,ZESTRIL) 5 MG tablet Take 1 tablet (5 mg total) by mouth daily. 07/26/12  Yes [name], MD  pravastatin (PRAVACHOL) 20 MG tablet Per PCP Take 1 tablet daily 11/17/10  Yes [name], MD  warfarin (COUMADIN) 2 MG tablet TAKE AS DIRECTED BY ANTIGOAGULATION CLINIC 11/08/12  Yes [name], MD    No Known Allergies   Physical Exam:  General - No distress ENT - No sinus tenderness, no oral exudate, no LAN Cardiac -  s1s2 regular, no murmur Chest - No wheeze/rales/dullness Back - No focal tenderness Abd - Soft, non-tender Ext - No edema Neuro - Normal strength Skin - No rashes Psych - normal mood, and behavior   Assessment/Plan:  Coralyn Helling, MD [facility] Pager:  [phone]

## 2013-03-19 ENCOUNTER — Other Ambulatory Visit: Payer: Self-pay | Admitting: Cardiology

## 2013-03-22 ENCOUNTER — Telehealth: Payer: Self-pay | Admitting: Cardiology

## 2013-03-22 NOTE — Telephone Encounter (Signed)
New problem:  Pt is calling to see if it is ok for him to have a dental procedure. Pt states he had a cardioversion about 3 weeks ago. Pt is requesting a call back.

## 2013-03-22 NOTE — Telephone Encounter (Signed)
Advised patient

## 2013-03-22 NOTE — Telephone Encounter (Signed)
Yes okay to have the dental procedure now.

## 2013-03-22 NOTE — Telephone Encounter (Signed)
Will forward to  Dr. Brackbill for review 

## 2013-04-02 ENCOUNTER — Other Ambulatory Visit: Payer: Self-pay | Admitting: Cardiology

## 2013-04-04 ENCOUNTER — Ambulatory Visit (INDEPENDENT_AMBULATORY_CARE_PROVIDER_SITE_OTHER): Admitting: Cardiology

## 2013-04-04 ENCOUNTER — Ambulatory Visit (INDEPENDENT_AMBULATORY_CARE_PROVIDER_SITE_OTHER): Admitting: General Practice

## 2013-04-04 ENCOUNTER — Encounter: Payer: Self-pay | Admitting: Cardiology

## 2013-04-04 VITALS — BP 116/73 | HR 57 | Ht 76.0 in | Wt 253.0 lb

## 2013-04-04 DIAGNOSIS — R5381 Other malaise: Secondary | ICD-10-CM

## 2013-04-04 DIAGNOSIS — I480 Paroxysmal atrial fibrillation: Secondary | ICD-10-CM

## 2013-04-04 DIAGNOSIS — I428 Other cardiomyopathies: Secondary | ICD-10-CM

## 2013-04-04 DIAGNOSIS — E7800 Pure hypercholesterolemia, unspecified: Secondary | ICD-10-CM

## 2013-04-04 DIAGNOSIS — I4891 Unspecified atrial fibrillation: Secondary | ICD-10-CM

## 2013-04-04 DIAGNOSIS — Z7901 Long term (current) use of anticoagulants: Secondary | ICD-10-CM

## 2013-04-04 DIAGNOSIS — G4733 Obstructive sleep apnea (adult) (pediatric): Secondary | ICD-10-CM

## 2013-04-04 NOTE — Patient Instructions (Signed)
Your physician recommends that you continue on your current medications as directed. Please refer to the Current Medication list given to you today.  Your physician recommends that you schedule a follow-up appointment in: 3 months with fasting labs (LP/BMET/HFP/CBC) and ekg   

## 2013-04-04 NOTE — Assessment & Plan Note (Signed)
No awareness of any atrial fibrillation since cardioversion.

## 2013-04-04 NOTE — Progress Notes (Signed)
Ruben [name] Date of Birth:  [birth year] [address] [employer] [place], [place]  [address] [phone]         Fax   [phone]  History of Present Illness: This pleasant 62 year old gentleman is seen for a post cardioversion office visit.  He underwent successful direct current cardioversion 03/02/13. 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 His most recent echocardiogram was in 01/16/13 showed significant improvement of his left ventricular function back to an ejection fraction of 50-55%.   In may 2012 he was previously successfully cardioverted. He is maintained on warfarin.  The patient went back into atrial fibrillation in August 2014.  He remains on amiodarone 200 mg one daily.  Since last visit he has not been aware of any palpitations or irregular heartbeat.  He had successful root canal surgery following his last visit.   Current Outpatient Prescriptions  Medication Sig Dispense Refill  . amiodarone (PACERONE) 200 MG tablet Take 1 tablet (200 mg total) by mouth daily.  90 tablet  0  . carvedilol (COREG) 6.25 MG tablet 1/2 TABLET TWICE A DAY  90 tablet  3  . hydrALAZINE (APRESOLINE) 25 MG tablet TAKE ONE-HALF TABLET (12.5 MG TOTAL) THREE TIMES A DAY  90 tablet  1  . lisinopril (PRINIVIL,ZESTRIL) 5 MG tablet Take 1 tablet (5 mg total) by mouth daily.  90 tablet  3  . pravastatin (PRAVACHOL) 20 MG tablet Per PCP Take 1 tablet daily      . warfarin (COUMADIN) 2 MG tablet TAKE AS DIRECTED BY ANTICOAGULATION CLINIC  180 tablet  1   No current facility-administered medications for this visit.    No Known Allergies  Patient Active Problem List   Diagnosis Date Noted  . CARDIOMYOPATHY 06/05/2010    Priority: High  . Atrial fibrillation 06/05/2010    Priority: High  . Malaise and fatigue 03/30/2012  . Pure hypercholesterolemia 11/17/2010  . OSA (obstructive sleep apnea) 09/04/2010    History  Smoking status  .  Never Smoker   Smokeless tobacco  . Never Used    History  Alcohol Use No    Family History  Problem Relation Age of Onset  . Hypertension Mother     Review of Systems: Constitutional: no fever chills diaphoresis or fatigue or change in weight.  Head and neck: no hearing loss, no epistaxis, no photophobia or visual disturbance. Respiratory: No cough, shortness of breath or wheezing. Cardiovascular: No chest pain peripheral edema, palpitations. Gastrointestinal: No abdominal distention, no abdominal pain, no change in bowel habits hematochezia or melena. Genitourinary: No dysuria, no frequency, no urgency, no nocturia. Musculoskeletal:No arthralgias, no back pain, no gait disturbance or myalgias. Neurological: No dizziness, no headaches, no numbness, no seizures, no syncope, no weakness, no tremors. Hematologic: No lymphadenopathy, no easy bruising. Psychiatric: No confusion, no hallucinations, no sleep disturbance.    Physical Exam: Filed Vitals:   04/04/13 0919  BP: 116/73  Pulse: 57   general appearance reveals a large gentleman in no distress.The head and neck exam reveals pupils equal and reactive.  Extraocular movements are full.  There is no scleral icterus.  The mouth and pharynx are normal.  The neck is supple.  The carotids reveal no bruits.  The jugular venous pressure is normal.  The  thyroid is not enlarged.  There is no lymphadenopathy.  The chest is clear to percussion and auscultation.  There are no rales or rhonchi.  Expansion of the  chest is symmetrical.  The precordium is quiet.  The pulse is irregularly irregular  The first heart sound is normal.  The second heart sound is physiologically split.  There is no murmur gallop rub or click.  There is no abnormal lift or heave.  The abdomen is soft and nontender.  The bowel sounds are normal.  The liver and spleen are not enlarged.  There are no abdominal masses.  There are no abdominal bruits.  Extremities reveal good  pedal pulses.  There is no phlebitis or edema.  There is no cyanosis or clubbing.  Strength is normal and symmetrical in all extremities.  There is no lateralizing weakness.  There are no sensory deficits.  The skin is warm and dry.  There is no rash.   EKG today shows sinus bradycardia at 47 per minute.  Assessment / Plan: Continue same medication.  Recheck in 3 months for followup office visit EKG CBC TSH lipid panel hepatic function panel and basal metabolic panel.

## 2013-04-04 NOTE — Assessment & Plan Note (Signed)
The patient has not been experiencing any chest pain or shortness of breath.  His energy level is good

## 2013-04-04 NOTE — Assessment & Plan Note (Signed)
Energy level is improved since he is back in sinus rhythm

## 2013-04-04 NOTE — Assessment & Plan Note (Signed)
Patient has a history of hypercholesterolemia and is on pravastatin.  We will plan to recheck fasting lab work at his next visit.

## 2013-04-05 ENCOUNTER — Encounter: Payer: Self-pay | Admitting: *Deleted

## 2013-04-05 NOTE — Telephone Encounter (Signed)
This encounter was created in error - please disregard.

## 2013-04-17 ENCOUNTER — Other Ambulatory Visit: Payer: Self-pay | Admitting: Cardiology

## 2013-04-18 ENCOUNTER — Telehealth: Payer: Self-pay | Admitting: Cardiology

## 2013-04-18 NOTE — Telephone Encounter (Signed)
Advised patient ok to take plain Mucinex and drink plenty of fluids.

## 2013-04-18 NOTE — Telephone Encounter (Signed)
New message    Pt is getting a cold, what can he take for a cold that will not interfere with his heart medication?

## 2013-04-25 ENCOUNTER — Ambulatory Visit (INDEPENDENT_AMBULATORY_CARE_PROVIDER_SITE_OTHER): Admitting: *Deleted

## 2013-04-25 DIAGNOSIS — Z7901 Long term (current) use of anticoagulants: Secondary | ICD-10-CM

## 2013-04-25 DIAGNOSIS — I4891 Unspecified atrial fibrillation: Secondary | ICD-10-CM

## 2013-04-25 LAB — POCT INR: INR: 2.4

## 2013-04-28 ENCOUNTER — Telehealth: Payer: Self-pay | Admitting: Pulmonary Disease

## 2013-04-28 DIAGNOSIS — G4733 Obstructive sleep apnea (adult) (pediatric): Secondary | ICD-10-CM

## 2013-04-28 NOTE — Telephone Encounter (Signed)
Pt is aware of CPAP report results. 

## 2013-04-28 NOTE — Telephone Encounter (Signed)
CPAP 03/01/13 to 03/30/13 >> Used on 20 of 30 nights with average 4 hrs 50 min.  Average AHI 12 with CPAP 11 cm H2O.  Will have my nurse inform pt that sleep apnea number is elevated.  I have sent order to increase his CPAP from 11 to 12 cm H2O.  He should call back if he has trouble tolerating pressure change.

## 2013-05-23 ENCOUNTER — Ambulatory Visit (INDEPENDENT_AMBULATORY_CARE_PROVIDER_SITE_OTHER): Admitting: *Deleted

## 2013-05-23 DIAGNOSIS — I4891 Unspecified atrial fibrillation: Secondary | ICD-10-CM

## 2013-05-23 DIAGNOSIS — Z5181 Encounter for therapeutic drug level monitoring: Secondary | ICD-10-CM | POA: Insufficient documentation

## 2013-05-23 DIAGNOSIS — Z7901 Long term (current) use of anticoagulants: Secondary | ICD-10-CM

## 2013-05-23 LAB — POCT INR: INR: 3.4

## 2013-05-31 ENCOUNTER — Telehealth: Payer: Self-pay | Admitting: Cardiology

## 2013-05-31 ENCOUNTER — Other Ambulatory Visit: Payer: Self-pay | Admitting: Cardiology

## 2013-05-31 NOTE — Telephone Encounter (Signed)
Will forward to  Dr. Brackbill for review 

## 2013-05-31 NOTE — Telephone Encounter (Signed)
Faxed as requested

## 2013-05-31 NOTE — Telephone Encounter (Signed)
New message   Tooth extraction on lower left.    Please advise pre-med & warfarin.   Waiting on document for reference guide for further dental treatment.

## 2013-05-31 NOTE — Telephone Encounter (Signed)
Okay to leave off his warfarin for 3 days before the dental extraction and then start back warfarin the evening of his surgery.  From a cardiac standpoint he does not need SBE prophylaxis

## 2013-06-14 ENCOUNTER — Encounter

## 2013-06-18 ENCOUNTER — Other Ambulatory Visit: Payer: Self-pay | Admitting: Cardiology

## 2013-06-19 ENCOUNTER — Ambulatory Visit (INDEPENDENT_AMBULATORY_CARE_PROVIDER_SITE_OTHER): Payer: BC Managed Care – PPO

## 2013-06-19 DIAGNOSIS — Z5181 Encounter for therapeutic drug level monitoring: Secondary | ICD-10-CM

## 2013-06-19 DIAGNOSIS — I4891 Unspecified atrial fibrillation: Secondary | ICD-10-CM

## 2013-06-19 LAB — POCT INR: INR: 3.7

## 2013-06-26 ENCOUNTER — Other Ambulatory Visit (INDEPENDENT_AMBULATORY_CARE_PROVIDER_SITE_OTHER): Payer: BC Managed Care – PPO

## 2013-06-26 DIAGNOSIS — E7800 Pure hypercholesterolemia, unspecified: Secondary | ICD-10-CM

## 2013-06-26 DIAGNOSIS — I4891 Unspecified atrial fibrillation: Secondary | ICD-10-CM

## 2013-06-26 DIAGNOSIS — I480 Paroxysmal atrial fibrillation: Secondary | ICD-10-CM

## 2013-06-26 LAB — HEPATIC FUNCTION PANEL
ALT: 20 U/L (ref 0–53)
AST: 21 U/L (ref 0–37)
Albumin: 3.9 g/dL (ref 3.5–5.2)
Alkaline Phosphatase: 61 U/L (ref 39–117)
Bilirubin, Direct: 0.1 mg/dL (ref 0.0–0.3)
Total Bilirubin: 0.7 mg/dL (ref 0.3–1.2)
Total Protein: 7 g/dL (ref 6.0–8.3)

## 2013-06-26 LAB — CBC WITH DIFFERENTIAL/PLATELET
BASOS ABS: 0 10*3/uL (ref 0.0–0.1)
Basophils Relative: 0.6 % (ref 0.0–3.0)
EOS ABS: 0.2 10*3/uL (ref 0.0–0.7)
Eosinophils Relative: 3.4 % (ref 0.0–5.0)
HEMATOCRIT: 40.3 % (ref 39.0–52.0)
Hemoglobin: 13.5 g/dL (ref 13.0–17.0)
LYMPHS ABS: 1.4 10*3/uL (ref 0.7–4.0)
Lymphocytes Relative: 28.2 % (ref 12.0–46.0)
MCHC: 33.5 g/dL (ref 30.0–36.0)
MCV: 95.4 fl (ref 78.0–100.0)
MONO ABS: 0.5 10*3/uL (ref 0.1–1.0)
Monocytes Relative: 9.6 % (ref 3.0–12.0)
NEUTROS PCT: 58.2 % (ref 43.0–77.0)
Neutro Abs: 2.9 10*3/uL (ref 1.4–7.7)
PLATELETS: 258 10*3/uL (ref 150.0–400.0)
RBC: 4.23 Mil/uL (ref 4.22–5.81)
RDW: 13.7 % (ref 11.5–14.6)
WBC: 4.9 10*3/uL (ref 4.5–10.5)

## 2013-06-26 LAB — BASIC METABOLIC PANEL
BUN: 19 mg/dL (ref 6–23)
CHLORIDE: 107 meq/L (ref 96–112)
CO2: 29 mEq/L (ref 19–32)
Calcium: 8.8 mg/dL (ref 8.4–10.5)
Creatinine, Ser: 1.2 mg/dL (ref 0.4–1.5)
GFR: 66.41 mL/min (ref >60.00)
Glucose, Bld: 95 mg/dL (ref 70–99)
POTASSIUM: 3.7 meq/L (ref 3.5–5.1)
SODIUM: 140 meq/L (ref 135–145)

## 2013-06-26 LAB — LIPID PANEL
CHOLESTEROL: 159 mg/dL (ref 0–200)
HDL: 48.2 mg/dL (ref >39.00)
LDL CALC: 86 mg/dL (ref 0–99)
Total CHOL/HDL Ratio: 3
Triglycerides: 123 mg/dL (ref 0.0–149.0)
VLDL: 24.6 mg/dL (ref 0.0–40.0)

## 2013-06-26 NOTE — Progress Notes (Signed)
Quick Note:  Please make copy of labs for patient visit. ______ 

## 2013-06-28 ENCOUNTER — Encounter: Payer: Self-pay | Admitting: Cardiology

## 2013-06-28 ENCOUNTER — Ambulatory Visit (INDEPENDENT_AMBULATORY_CARE_PROVIDER_SITE_OTHER): Payer: BC Managed Care – PPO | Admitting: *Deleted

## 2013-06-28 ENCOUNTER — Other Ambulatory Visit

## 2013-06-28 ENCOUNTER — Ambulatory Visit (INDEPENDENT_AMBULATORY_CARE_PROVIDER_SITE_OTHER): Admitting: Cardiology

## 2013-06-28 VITALS — BP 116/72 | HR 56 | Ht 76.0 in | Wt 250.4 lb

## 2013-06-28 DIAGNOSIS — I428 Other cardiomyopathies: Secondary | ICD-10-CM

## 2013-06-28 DIAGNOSIS — I480 Paroxysmal atrial fibrillation: Secondary | ICD-10-CM

## 2013-06-28 DIAGNOSIS — Z5181 Encounter for therapeutic drug level monitoring: Secondary | ICD-10-CM

## 2013-06-28 DIAGNOSIS — E7800 Pure hypercholesterolemia, unspecified: Secondary | ICD-10-CM

## 2013-06-28 DIAGNOSIS — I4891 Unspecified atrial fibrillation: Secondary | ICD-10-CM

## 2013-06-28 LAB — POCT INR: INR: 3.6

## 2013-06-28 NOTE — Progress Notes (Signed)
Ruben [name] Date of Birth:  18-Mar-1951 [address] [place], [place]  [address] [phone]         Fax   [phone]  History of Present Illness: This pleasant 63 year old gentleman is seen for a followup office visit.  He underwent successful direct current cardioversion 03/02/13. 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 His most recent echocardiogram was in 01/16/13 showed significant improvement of his left ventricular function back to an ejection fraction of 50-55%.   In may 2012 he was previously successfully cardioverted. He is maintained on warfarin.  The patient went back into atrial fibrillation in August 2014.  He was cardioverted successfully again on 03/02/13.  He remains on amiodarone 200 mg one daily.  Since last visit he has not been aware of any palpitations or irregular heartbeat.  Since last visit he has changed jobs.  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   Current Outpatient Prescriptions  Medication Sig Dispense Refill  . carvedilol (COREG) 6.25 MG tablet 1/2 TABLET TWICE A DAY  90 tablet  3  . hydrALAZINE (APRESOLINE) 25 MG tablet TAKE ONE-HALF (1/2) TABLET (12.5 MG TOTAL) THREE TIMES A DAY  135 tablet  3  . lisinopril (PRINIVIL,ZESTRIL) 5 MG tablet TAKE 1 TABLET DAILY  90 tablet  0  . PACERONE 200 MG tablet TAKE 1 TABLET DAILY  90 tablet  0  . pravastatin (PRAVACHOL) 20 MG tablet Per PCP Take 1 tablet daily      . warfarin (COUMADIN) 2 MG tablet TAKE AS DIRECTED BY ANTICOAGULATION CLINIC  180 tablet  1   No current facility-administered medications for this visit.    No Known Allergies  Patient Active Problem List   Diagnosis Date Noted  . CARDIOMYOPATHY 06/05/2010    Priority: High  . Atrial fibrillation 06/05/2010    Priority: High  . Encounter for  therapeutic drug monitoring 05/23/2013  . Malaise and fatigue 03/30/2012  . Pure hypercholesterolemia 11/17/2010  . OSA (obstructive sleep apnea) 09/04/2010    History  Smoking status  . Never Smoker   Smokeless tobacco  . Never Used    History  Alcohol Use No    Family History  Problem Relation Age of Onset  . Hypertension Mother     Review of Systems: Constitutional: no fever chills diaphoresis or fatigue or change in weight.  Head and neck: no hearing loss, no epistaxis, no photophobia or visual disturbance. Respiratory: No cough, shortness of breath or wheezing. Cardiovascular: No chest pain peripheral edema, palpitations. Gastrointestinal: No abdominal distention, no abdominal pain, no change in bowel habits hematochezia or melena. Genitourinary: No dysuria, no frequency, no urgency, no nocturia. Musculoskeletal:No arthralgias, no back pain, no gait disturbance or myalgias. Neurological: No dizziness, no headaches, no numbness, no seizures, no syncope, no weakness, no tremors. Hematologic: No lymphadenopathy, no easy bruising. Psychiatric: No confusion, no hallucinations, no sleep disturbance.    Physical Exam: Filed Vitals:   06/28/13 0818  BP: 116/72  Pulse: 56   general appearance reveals a large gentleman in no distress.The head and neck exam reveals pupils equal and reactive.  Extraocular movements are full.  There is no scleral icterus.  The mouth and pharynx are normal.  The neck is supple.  The carotids reveal no bruits.  The jugular venous  pressure is normal.  The  thyroid is not enlarged.  There is no lymphadenopathy.  The chest is clear to percussion and auscultation.  There are no rales or rhonchi.  Expansion of the chest is symmetrical.  The precordium is quiet.  The pulse is irregularly irregular  The first heart sound is normal.  The second heart sound is physiologically split.  There is no murmur gallop rub or click.  There is no abnormal lift or heave.   The abdomen is soft and nontender.  The bowel sounds are normal.  The liver and spleen are not enlarged.  There are no abdominal masses.  There are no abdominal bruits.  Extremities reveal good pedal pulses.  There is no phlebitis or edema.  There is no cyanosis or clubbing.  Strength is normal and symmetrical in all extremities.  There is no lateralizing weakness.  There are no sensory deficits.  The skin is warm and dry.  There is no rash.   EKG today shows sinus bradycardia at 48 per minute.  Assessment / Plan: The patient was commended on his weight loss and improved diet.  Continue same medication.  Recheck in 4 months for office visit EKG lipid panel hepatic function panel nasal metabolic panel TSH and free T4 to follow his amiodarone.  His last chest x-ray was March 2013.  Consider followup chest x-ray after next office visit.

## 2013-06-28 NOTE — Assessment & Plan Note (Signed)
The patient has not been aware of any recurrent atrial fibrillation.  EKG today confirms sinus rhythm.  He is going to have to have further dental extractions.  Since he is in sinus rhythm I told him that he could stop his Coumadin for 4 days prior to dental extraction.

## 2013-06-28 NOTE — Assessment & Plan Note (Signed)
He and his wife have changed her eating habits.  He has lost 3 pounds.  His triglycerides her weight down and his HDL is up reflecting the improved diet.

## 2013-06-28 NOTE — Patient Instructions (Signed)
Your physician recommends that you continue on your current medications as directed. Please refer to the Current Medication list given to you today.  Your physician wants you to follow-up in: 4 months with fasting labs (lp/bmet/hfpTSH/ft4) and ekg  You will receive a reminder letter in the mail two months in advance. If you don't receive a letter, please call our office to schedule the follow-up appointment.

## 2013-07-12 ENCOUNTER — Encounter

## 2013-07-17 ENCOUNTER — Ambulatory Visit (INDEPENDENT_AMBULATORY_CARE_PROVIDER_SITE_OTHER): Payer: BC Managed Care – PPO | Admitting: *Deleted

## 2013-07-17 DIAGNOSIS — Z5181 Encounter for therapeutic drug level monitoring: Secondary | ICD-10-CM

## 2013-07-17 DIAGNOSIS — I4891 Unspecified atrial fibrillation: Secondary | ICD-10-CM

## 2013-07-17 LAB — POCT INR: INR: 1.7

## 2013-07-31 ENCOUNTER — Ambulatory Visit (INDEPENDENT_AMBULATORY_CARE_PROVIDER_SITE_OTHER): Payer: BC Managed Care – PPO | Admitting: *Deleted

## 2013-07-31 DIAGNOSIS — Z5181 Encounter for therapeutic drug level monitoring: Secondary | ICD-10-CM

## 2013-07-31 DIAGNOSIS — I4891 Unspecified atrial fibrillation: Secondary | ICD-10-CM

## 2013-07-31 LAB — POCT INR: INR: 2.3

## 2013-08-06 ENCOUNTER — Other Ambulatory Visit: Payer: Self-pay | Admitting: Cardiology

## 2013-08-22 ENCOUNTER — Ambulatory Visit (INDEPENDENT_AMBULATORY_CARE_PROVIDER_SITE_OTHER): Payer: BC Managed Care – PPO

## 2013-08-22 DIAGNOSIS — Z5181 Encounter for therapeutic drug level monitoring: Secondary | ICD-10-CM

## 2013-08-22 DIAGNOSIS — I4891 Unspecified atrial fibrillation: Secondary | ICD-10-CM

## 2013-08-22 LAB — POCT INR: INR: 2.4

## 2013-08-26 ENCOUNTER — Other Ambulatory Visit: Payer: Self-pay | Admitting: Cardiology

## 2013-08-30 ENCOUNTER — Other Ambulatory Visit: Payer: Self-pay

## 2013-08-30 ENCOUNTER — Telehealth: Payer: Self-pay

## 2013-08-30 MED ORDER — AMIODARONE HCL 200 MG PO TABS: ORAL_TABLET | ORAL | Status: DC

## 2013-08-30 MED ORDER — LISINOPRIL 5 MG PO TABS: ORAL_TABLET | ORAL | Status: DC

## 2013-08-30 MED ORDER — CARVEDILOL 6.25 MG PO TABS: ORAL_TABLET | ORAL | Status: DC

## 2013-08-30 MED ORDER — HYDRALAZINE HCL 25 MG PO TABS: ORAL_TABLET | ORAL | Status: DC

## 2013-08-30 MED ORDER — WARFARIN SODIUM 2 MG PO TABS: ORAL_TABLET | ORAL | Status: DC

## 2013-08-30 NOTE — Telephone Encounter (Signed)
Attempted to contact pt, LMOM TCB.  Pt wants new rx for Coumadin to be sent to [facility].  Called to inquire if wants a 30 day supply or 90 day supply.  LMTCB and advise.

## 2013-08-30 NOTE — Telephone Encounter (Signed)
Patient returned Erika's call and stated he would like a 90 say supply. Thanks, MI

## 2013-09-19 ENCOUNTER — Ambulatory Visit (INDEPENDENT_AMBULATORY_CARE_PROVIDER_SITE_OTHER): Payer: BC Managed Care – PPO

## 2013-09-19 DIAGNOSIS — Z5181 Encounter for therapeutic drug level monitoring: Secondary | ICD-10-CM

## 2013-09-19 DIAGNOSIS — I4891 Unspecified atrial fibrillation: Secondary | ICD-10-CM

## 2013-09-19 LAB — POCT INR: INR: 2.6

## 2013-10-23 ENCOUNTER — Other Ambulatory Visit (INDEPENDENT_AMBULATORY_CARE_PROVIDER_SITE_OTHER): Payer: BC Managed Care – PPO

## 2013-10-23 DIAGNOSIS — Z5181 Encounter for therapeutic drug level monitoring: Secondary | ICD-10-CM

## 2013-10-23 LAB — HEPATIC FUNCTION PANEL
ALT: 18 U/L (ref 0–53)
AST: 19 U/L (ref 0–37)
Albumin: 3.7 g/dL (ref 3.5–5.2)
Alkaline Phosphatase: 66 U/L (ref 39–117)
BILIRUBIN TOTAL: 0.2 mg/dL (ref 0.2–1.2)
Bilirubin, Direct: 0 mg/dL (ref 0.0–0.3)
Total Protein: 6.8 g/dL (ref 6.0–8.3)

## 2013-10-23 LAB — LIPID PANEL
CHOLESTEROL: 169 mg/dL (ref 0–200)
HDL: 42.3 mg/dL (ref >39.00)
LDL Cholesterol: 89 mg/dL (ref 0–99)
NonHDL: 126.7
Total CHOL/HDL Ratio: 4
Triglycerides: 188 mg/dL — ABNORMAL HIGH (ref 0.0–149.0)
VLDL: 37.6 mg/dL (ref 0.0–40.0)

## 2013-10-23 LAB — BASIC METABOLIC PANEL
BUN: 15 mg/dL (ref 6–23)
CALCIUM: 8.8 mg/dL (ref 8.4–10.5)
CO2: 27 mEq/L (ref 19–32)
Chloride: 109 mEq/L (ref 96–112)
Creatinine, Ser: 1.3 mg/dL (ref 0.4–1.5)
GFR: 62.07 mL/min (ref >60.00)
GLUCOSE: 105 mg/dL — AB (ref 70–99)
Potassium: 3.8 mEq/L (ref 3.5–5.1)
Sodium: 142 mEq/L (ref 135–145)

## 2013-10-23 LAB — T4, FREE: Free T4: 1.15 ng/dL (ref 0.60–1.60)

## 2013-10-23 LAB — TSH: TSH: 0.96 u[IU]/mL (ref 0.35–4.50)

## 2013-10-23 NOTE — Progress Notes (Signed)
Quick Note:  Please make copy of labs for patient visit. ______ 

## 2013-10-24 ENCOUNTER — Other Ambulatory Visit: Payer: BC Managed Care – PPO

## 2013-10-26 ENCOUNTER — Encounter: Payer: Self-pay | Admitting: Cardiology

## 2013-10-26 ENCOUNTER — Ambulatory Visit (INDEPENDENT_AMBULATORY_CARE_PROVIDER_SITE_OTHER): Payer: BC Managed Care – PPO

## 2013-10-26 ENCOUNTER — Ambulatory Visit (INDEPENDENT_AMBULATORY_CARE_PROVIDER_SITE_OTHER): Payer: BC Managed Care – PPO | Admitting: Cardiology

## 2013-10-26 VITALS — BP 120/68 | HR 50 | Ht 76.0 in | Wt 249.0 lb

## 2013-10-26 DIAGNOSIS — I4891 Unspecified atrial fibrillation: Secondary | ICD-10-CM

## 2013-10-26 DIAGNOSIS — Z79899 Other long term (current) drug therapy: Secondary | ICD-10-CM

## 2013-10-26 DIAGNOSIS — R5381 Other malaise: Secondary | ICD-10-CM

## 2013-10-26 DIAGNOSIS — Z5181 Encounter for therapeutic drug level monitoring: Secondary | ICD-10-CM

## 2013-10-26 DIAGNOSIS — E7800 Pure hypercholesterolemia, unspecified: Secondary | ICD-10-CM

## 2013-10-26 DIAGNOSIS — R5383 Other fatigue: Secondary | ICD-10-CM

## 2013-10-26 LAB — POCT INR: INR: 2.9

## 2013-10-26 NOTE — Assessment & Plan Note (Signed)
He remains on long-term warfarin.  He has not had any awareness of recurrent atrial fibrillation.  Blood work shows that he is tolerating amiodarone.  We are updating his chest x-ray

## 2013-10-26 NOTE — Patient Instructions (Signed)
Your physician recommends that you continue on your current medications as directed. Please refer to the Current Medication list given to you today.  A chest x-ray takes a picture of the organs and structures inside the chest, including the heart, lungs, and blood vessels. This test can show several things, including, whether the heart is enlarges; whether fluid is building up in the lungs; and whether pacemaker / defibrillator leads are still in place. [facility] BETWEEN 8:30 AM AND 4:00 PM   Your physician wants you to follow-up in: 4 months with fasting labs (lp/bmet/hfp)  You will receive a reminder letter in the mail two months in advance. If you don't receive a letter, please call our office to schedule the follow-up appointment.

## 2013-10-26 NOTE — Progress Notes (Signed)
Ruben [name] Date of Birth:  [birth year] [facility] [address] [place], [place]  [address] [phone]        Fax   [phone]   History of Present Illness: This pleasant 63 year old gentleman is seen for a followup office visit.  He has a past history of nonischemic dilated cardiomyopathy and associated atrial fibrillation at that time.  He was hospitalized in February 2012 and was critically ill with severe systolic left ventricular dysfunction and atrial fibrillation.  He was successfully cardioverted back to sinus rhythm in May 2012.  He did well until August 2014 when he went back into atrial fibrillation after we had cut back his amiodarone to just 100 mg daily.  His dose was increased and we cardioverted him successfully on 03/02/13.  Since then he has maintained normal sinus rhythm.  His echocardiogram in 01/14/13 showed that his ejection fraction had improved to 50-55%. Since last visit has had no new cardiac symptoms.   Current Outpatient Prescriptions  Medication Sig Dispense Refill  . amiodarone (PACERONE) 200 MG tablet TAKE 1 TABLET DAILY  90 tablet  1  . carvedilol (COREG) 6.25 MG tablet 1/2 TABLET TWICE A DAY  90 tablet  3  . hydrALAZINE (APRESOLINE) 25 MG tablet TAKE ONE-HALF (1/2) TABLET (12.5 MG TOTAL) THREE TIMES A DAY  135 tablet  3  . lisinopril (PRINIVIL,ZESTRIL) 5 MG tablet TAKE 1 TABLET DAILY  90 tablet  3  . pravastatin (PRAVACHOL) 20 MG tablet Per PCP Take 1 tablet daily      . warfarin (COUMADIN) 2 MG tablet Take as directed by anticoagulation clinic  160 tablet  1   No current facility-administered medications for this visit.    No Known Allergies  Patient Active Problem List   Diagnosis Date Noted  . CARDIOMYOPATHY 06/05/2010    Priority: High  . Atrial fibrillation 06/05/2010    Priority: High  . Encounter for therapeutic drug monitoring 05/23/2013  . Malaise and fatigue 03/30/2012  . Pure hypercholesterolemia  11/17/2010  . OSA (obstructive sleep apnea) 09/04/2010    History  Smoking status  . Never Smoker   Smokeless tobacco  . Never Used    History  Alcohol Use No    Family History  Problem Relation Age of Onset  . Hypertension Mother     Review of Systems: Constitutional: no fever chills diaphoresis or fatigue or change in weight.  Head and neck: no hearing loss, no epistaxis, no photophobia or visual disturbance. Respiratory: No cough, shortness of breath or wheezing. Cardiovascular: No chest pain peripheral edema, palpitations. Gastrointestinal: No abdominal distention, no abdominal pain, no change in bowel habits hematochezia or melena. Genitourinary: No dysuria, no frequency, no urgency, no nocturia. Musculoskeletal:No arthralgias, no back pain, no gait disturbance or myalgias. Neurological: No dizziness, no headaches, no numbness, no seizures, no syncope, no weakness, no tremors. Hematologic: No lymphadenopathy, no easy bruising. Psychiatric: No confusion, no hallucinations, no sleep disturbance.    Physical Exam: Filed Vitals:   10/26/13 1543  BP: 120/68  Pulse: 50   the general appearance reveals a large middle-aged gentleman in no acute distress.The head and neck exam reveals pupils equal and reactive.  Extraocular movements are full.  There is no scleral icterus.  The mouth and pharynx are normal.  The neck is supple.  The carotids reveal no bruits.  The jugular venous pressure is normal.  The  thyroid is not enlarged.  There is no lymphadenopathy.  The chest is clear to percussion and auscultation.  There are no rales or rhonchi.  Expansion of the chest is symmetrical.  The precordium is quiet.  The first heart sound is normal.  The second heart sound is physiologically split.  There is no murmur gallop rub or click.  There is no abnormal lift or heave.  The abdomen is soft and nontender.  The bowel sounds are normal.  The liver and spleen are not enlarged.  There are no  abdominal masses.  There are no abdominal bruits.  Extremities reveal good pedal pulses.  There is no phlebitis or edema.  There is no cyanosis or clubbing.  Strength is normal and symmetrical in all extremities.  There is no lateralizing weakness.  There are no sensory deficits.  The skin is warm and dry.  There is no rash.  EKG shows sinus bradycardia and otherwise normal EKG.  Assessment / Plan: 1. 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  Subsequent gradual improvement of ejection fraction back to normal levels. 2. paroxysmal atrial fibrillation, on long-term amiodarone and warfarin.  Maintaining normal sinus rhythm.  Last cardioverted 03/02/13. 3. Dyslipidemia  Plan: Update chest x-ray in regard to amiodarone.  Continue same medication.  Recheck in 4 months for office visit lipid panel hepatic function panel basal metabolic panel and TSH.

## 2013-10-26 NOTE — Assessment & Plan Note (Signed)
Patient is on pravastatin for his hypercholesterolemia.  No myalgias or side effects.  Lab work is stable

## 2013-10-26 NOTE — Addendum Note (Signed)
Addended by: Migdalia Dk on: 10/26/2013 03:54 PM   Modules accepted: Level of Service

## 2013-10-26 NOTE — Assessment & Plan Note (Signed)
His previous complaints of malaise and fatigue seemed to be improving.  He is trying to get more exercise.  His weight is down 1 pound since last visit.

## 2013-10-27 ENCOUNTER — Ambulatory Visit
Admission: RE | Admit: 2013-10-27 | Discharge: 2013-10-27 | Disposition: A | Discharge status: Home / Self Care | Payer: BC Managed Care – PPO | Source: Ambulatory Visit | Attending: Cardiology | Admitting: Cardiology

## 2013-10-27 DIAGNOSIS — Z79899 Other long term (current) drug therapy: Secondary | ICD-10-CM

## 2013-10-27 DIAGNOSIS — I4891 Unspecified atrial fibrillation: Secondary | ICD-10-CM

## 2013-11-18 ENCOUNTER — Other Ambulatory Visit: Payer: Self-pay | Admitting: Cardiology

## 2013-12-07 ENCOUNTER — Ambulatory Visit (INDEPENDENT_AMBULATORY_CARE_PROVIDER_SITE_OTHER): Payer: BC Managed Care – PPO

## 2013-12-07 DIAGNOSIS — Z5181 Encounter for therapeutic drug level monitoring: Secondary | ICD-10-CM

## 2013-12-07 DIAGNOSIS — I4891 Unspecified atrial fibrillation: Secondary | ICD-10-CM

## 2013-12-07 LAB — POCT INR: INR: 2.5

## 2014-01-18 ENCOUNTER — Ambulatory Visit (INDEPENDENT_AMBULATORY_CARE_PROVIDER_SITE_OTHER): Payer: BC Managed Care – PPO

## 2014-01-18 DIAGNOSIS — Z5181 Encounter for therapeutic drug level monitoring: Secondary | ICD-10-CM

## 2014-01-18 DIAGNOSIS — I4891 Unspecified atrial fibrillation: Secondary | ICD-10-CM

## 2014-01-18 LAB — POCT INR: INR: 2.1

## 2014-02-26 ENCOUNTER — Ambulatory Visit (INDEPENDENT_AMBULATORY_CARE_PROVIDER_SITE_OTHER): Payer: BC Managed Care – PPO

## 2014-02-26 ENCOUNTER — Other Ambulatory Visit (INDEPENDENT_AMBULATORY_CARE_PROVIDER_SITE_OTHER): Payer: BC Managed Care – PPO | Admitting: *Deleted

## 2014-02-26 DIAGNOSIS — I4891 Unspecified atrial fibrillation: Secondary | ICD-10-CM

## 2014-02-26 DIAGNOSIS — Z5181 Encounter for therapeutic drug level monitoring: Secondary | ICD-10-CM

## 2014-02-26 DIAGNOSIS — E7800 Pure hypercholesterolemia, unspecified: Secondary | ICD-10-CM

## 2014-02-26 DIAGNOSIS — Z79899 Other long term (current) drug therapy: Secondary | ICD-10-CM

## 2014-02-26 LAB — TSH: TSH: 1.87 u[IU]/mL (ref 0.35–4.50)

## 2014-02-26 LAB — POCT INR: INR: 2.2

## 2014-02-27 LAB — LIPID PANEL
CHOL/HDL RATIO: 4
Cholesterol: 167 mg/dL (ref 0–200)
HDL: 39.6 mg/dL (ref >39.00)
LDL CALC: 106 mg/dL — AB (ref 0–99)
NONHDL: 127.4
TRIGLYCERIDES: 107 mg/dL (ref 0.0–149.0)
VLDL: 21.4 mg/dL (ref 0.0–40.0)

## 2014-02-27 LAB — BASIC METABOLIC PANEL
BUN: 19 mg/dL (ref 6–23)
CO2: 24 meq/L (ref 19–32)
CREATININE: 1.3 mg/dL (ref 0.4–1.5)
Calcium: 8.6 mg/dL (ref 8.4–10.5)
Chloride: 109 mEq/L (ref 96–112)
GFR: 58.74 mL/min — ABNORMAL LOW (ref >60.00)
GLUCOSE: 97 mg/dL (ref 70–99)
POTASSIUM: 4.3 meq/L (ref 3.5–5.1)
Sodium: 143 mEq/L (ref 135–145)

## 2014-02-27 LAB — HEPATIC FUNCTION PANEL
ALK PHOS: 60 U/L (ref 39–117)
ALT: 17 U/L (ref 0–53)
AST: 18 U/L (ref 0–37)
Albumin: 3.2 g/dL — ABNORMAL LOW (ref 3.5–5.2)
BILIRUBIN TOTAL: 0.5 mg/dL (ref 0.2–1.2)
Bilirubin, Direct: 0.1 mg/dL (ref 0.0–0.3)
TOTAL PROTEIN: 6.6 g/dL (ref 6.0–8.3)

## 2014-02-27 NOTE — Progress Notes (Signed)
Quick Note:  Please report to patient. The recent labs are stable. Continue same medication and careful diet. ______ 

## 2014-02-28 ENCOUNTER — Ambulatory Visit: Payer: BC Managed Care – PPO | Admitting: Cardiology

## 2014-03-02 ENCOUNTER — Other Ambulatory Visit: Payer: Self-pay | Admitting: Cardiology

## 2014-03-22 DIAGNOSIS — L601 Onycholysis: Secondary | ICD-10-CM | POA: Insufficient documentation

## 2014-03-22 DIAGNOSIS — L608 Other nail disorders: Secondary | ICD-10-CM | POA: Insufficient documentation

## 2014-03-22 DIAGNOSIS — H02829 Cysts of unspecified eye, unspecified eyelid: Secondary | ICD-10-CM | POA: Insufficient documentation

## 2014-03-30 ENCOUNTER — Encounter: Payer: Self-pay | Admitting: Pulmonary Disease

## 2014-03-30 ENCOUNTER — Ambulatory Visit (INDEPENDENT_AMBULATORY_CARE_PROVIDER_SITE_OTHER): Payer: BC Managed Care – PPO | Admitting: Pulmonary Disease

## 2014-03-30 VITALS — BP 112/60 | HR 54 | Ht 76.0 in | Wt 262.6 lb

## 2014-03-30 DIAGNOSIS — G4733 Obstructive sleep apnea (adult) (pediatric): Secondary | ICD-10-CM

## 2014-03-30 NOTE — Patient Instructions (Signed)
Will call with CPAP report results Follow up in 1 year

## 2014-03-30 NOTE — Progress Notes (Signed)
Chief Complaint  Patient presents with  . Follow-up    OSA - Wears CPAP nightly. Denies problems with pressure setting. At times mask does not fit well.     History of Present Illness: Ruben [name] is a 63 y.o. male with OSA.  He has been doing well with CPAP.  He uses this every night.  He has no issues with mask fit.  He feels rested during the day.  TESTS: PFT 06/25/10>>FEV1 2.94(77%), FEV1% 73, TLC 6.75(83%), DLCO 70%, no BD  PSG 08/21/10>>AHI 12.5, RDI 24.9, SpO2 low 89%  CPAP 03/01/13 to 03/30/13 >> Used on 20 of 30 nights with average 4 hrs 50 min. Average AHI 12 with CPAP 11 cm H2O.  PMHx >> Non ischemic cardiomyopathy, Atrial fibrillation  PSHx, Medications, Allergies, Fhx, Shx reviewed.   Physical Exam: Blood pressure 112/60, pulse 54, height 6\' 4"  (1.93 m), weight 119.115 kg (262 lb 9.6 oz), SpO2 96 %. Body mass index is 31.98 kg/(m^2).  General - No distress ENT - No sinus tenderness, no oral exudate, no LAN Cardiac - s1s2 regular, no murmur Chest - No wheeze/rales/dullness Back - No focal tenderness Abd - Soft, non-tender Ext - No edema Neuro - Normal strength Skin - No rashes Psych - normal mood, and behavior   Assessment/Plan:  Obstructive sleep apnea >> he reports compliance with therapy and benefit from CPAP. Plan: - continue CPAP 11 cm H2O - will get download and call him with results   Coralyn Helling, MD [facility] [facility] Pager:  [phone]

## 2014-04-04 ENCOUNTER — Encounter: Payer: Self-pay | Admitting: *Deleted

## 2014-04-04 ENCOUNTER — Other Ambulatory Visit: Payer: Self-pay | Admitting: *Deleted

## 2014-04-05 ENCOUNTER — Encounter: Payer: Self-pay | Admitting: Cardiology

## 2014-04-05 ENCOUNTER — Ambulatory Visit (INDEPENDENT_AMBULATORY_CARE_PROVIDER_SITE_OTHER): Payer: BC Managed Care – PPO | Admitting: Cardiology

## 2014-04-05 ENCOUNTER — Ambulatory Visit (INDEPENDENT_AMBULATORY_CARE_PROVIDER_SITE_OTHER): Payer: BC Managed Care – PPO | Admitting: *Deleted

## 2014-04-05 VITALS — BP 132/66 | HR 48 | Ht 76.0 in | Wt 259.0 lb

## 2014-04-05 DIAGNOSIS — R5381 Other malaise: Secondary | ICD-10-CM

## 2014-04-05 DIAGNOSIS — R5383 Other fatigue: Secondary | ICD-10-CM

## 2014-04-05 DIAGNOSIS — G4733 Obstructive sleep apnea (adult) (pediatric): Secondary | ICD-10-CM

## 2014-04-05 DIAGNOSIS — I480 Paroxysmal atrial fibrillation: Secondary | ICD-10-CM

## 2014-04-05 DIAGNOSIS — I4891 Unspecified atrial fibrillation: Secondary | ICD-10-CM

## 2014-04-05 DIAGNOSIS — E7800 Pure hypercholesterolemia, unspecified: Secondary | ICD-10-CM

## 2014-04-05 DIAGNOSIS — Z5181 Encounter for therapeutic drug level monitoring: Secondary | ICD-10-CM

## 2014-04-05 LAB — POCT INR: INR: 2.2

## 2014-04-05 MED ORDER — AMIODARONE HCL 200 MG PO TABS: ORAL_TABLET | ORAL | Status: DC

## 2014-04-05 NOTE — Assessment & Plan Note (Signed)
Tolerating CPAP machine well

## 2014-04-05 NOTE — Assessment & Plan Note (Signed)
No symptoms of congestive heart failure.  Exercise tolerance is good.  He plans on starting to exercise at the gym.

## 2014-04-05 NOTE — Progress Notes (Signed)
Ruben [name] Date of Birth:  1951-02-18 [facility] [address] [place], [place]  [address] [phone]        Fax   [phone]   History of Present Illness: This pleasant 63 year old gentleman is seen for a followup office visit.  He has a past history of nonischemic dilated cardiomyopathy and associated atrial fibrillation at that time.  He was hospitalized in February 2012 and was critically ill with severe systolic left ventricular dysfunction and atrial fibrillation.  He was successfully cardioverted back to sinus rhythm in May 2012.  He did well until August 2014 when he went back into atrial fibrillation after we had cut back his amiodarone to just 100 mg daily.  His dose was increased and we cardioverted him successfully on 03/02/13.  Since then he has maintained normal sinus rhythm.  His echocardiogram in 01/14/13 showed that his ejection fraction had improved to 50-55%. Since last visit has had no new cardiac symptoms. He has a history of sleep apnea and uses a CPAP machine successfully.   Current Outpatient Prescriptions  Medication Sig Dispense Refill  . amiodarone (PACERONE) 200 MG tablet TAKE 1 TABLET DAILY 90 tablet 2  . carvedilol (COREG) 6.25 MG tablet 1/2 TABLET TWICE A DAY 90 tablet 3  . hydrALAZINE (APRESOLINE) 25 MG tablet TAKE ONE-HALF (1/2) TABLET (12.5 MG) THREE TIMES A DAY (Patient taking differently: (1/2) TABLET (12.5 MG) THREE TIMES A DAY) 135 tablet 1  . lisinopril (PRINIVIL,ZESTRIL) 5 MG tablet TAKE 1 TABLET DAILY 90 tablet 3  . pravastatin (PRAVACHOL) 20 MG tablet Per PCP Take 1 tablet daily    . warfarin (COUMADIN) 2 MG tablet TAKE AS DIRECTED BY ANTICOAGULATION CLINIC 180 tablet 1   No current facility-administered medications for this visit.    No Known Allergies  Patient Active Problem List   Diagnosis Date Noted  . CARDIOMYOPATHY 06/05/2010    Priority: High  . Atrial fibrillation 06/05/2010    Priority: High  .  Cyst of eyelid 03/22/2014  . Discoloration of nail 03/22/2014  . Detachment of nail 03/22/2014  . Encounter for therapeutic drug monitoring 05/23/2013  . HLD (hyperlipidemia) 05/27/2012  . Malaise and fatigue 03/30/2012  . Pure hypercholesterolemia 11/17/2010  . OSA (obstructive sleep apnea) 09/04/2010    History  Smoking status  . Never Smoker   Smokeless tobacco  . Never Used    History  Alcohol Use No    Family History  Problem Relation Age of Onset  . Cancer Mother   . Stroke Mother   . Hypertension Mother   . Memory loss Mother   . Prostate cancer Father     Review of Systems: Constitutional: no fever chills diaphoresis or fatigue or change in weight.  Head and neck: no hearing loss, no epistaxis, no photophobia or visual disturbance. Respiratory: No cough, shortness of breath or wheezing. Cardiovascular: No chest pain peripheral edema, palpitations. Gastrointestinal: No abdominal distention, no abdominal pain, no change in bowel habits hematochezia or melena. Genitourinary: No dysuria, no frequency, no urgency, no nocturia. Musculoskeletal:No arthralgias, no back pain, no gait disturbance or myalgias. Neurological: No dizziness, no headaches, no numbness, no seizures, no syncope, no weakness, no tremors. Hematologic: No lymphadenopathy, no easy bruising. Psychiatric: No confusion, no hallucinations, no sleep disturbance.    Physical Exam: Filed Vitals:   04/05/14 1542  BP: 132/66  Pulse: 48   the general appearance reveals a large middle-aged gentleman in no acute distress.The head and  neck exam reveals pupils equal and reactive.  Extraocular movements are full.  There is no scleral icterus.  The mouth and pharynx are normal.  The neck is supple.  The carotids reveal no bruits.  The jugular venous pressure is normal.  The  thyroid is not enlarged.  There is no lymphadenopathy.  The chest is clear to percussion and auscultation.  There are no rales or rhonchi.   Expansion of the chest is symmetrical.  The precordium is quiet.  The first heart sound is normal.  The second heart sound is physiologically split.  There is no murmur gallop rub or click.  There is no abnormal lift or heave.  The abdomen is soft and nontender.  The bowel sounds are normal.  The liver and spleen are not enlarged.  There are no abdominal masses.  There are no abdominal bruits.  Extremities reveal good pedal pulses.  There is no phlebitis or edema.  There is no cyanosis or clubbing.  Strength is normal and symmetrical in all extremities.  There is no lateralizing weakness.  There are no sensory deficits.  The skin is warm and dry.  There is no rash.  EKG shows sinus bradycardia at 48/min and otherwise normal EKG.  Assessment / Plan: 1. 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  Subsequent gradual improvement of ejection fraction back to normal levels. 2. paroxysmal atrial fibrillation, on long-term amiodarone and warfarin.  Maintaining normal sinus rhythm.  Last cardioverted 03/02/13. 3. Dyslipidemia  Plan: Continue same medication.  Recheck at 6 month intervals.  Return for office visit EKG lipid panel hepatic function panel basal metabolic panel and CBC.

## 2014-04-05 NOTE — Assessment & Plan Note (Signed)
The patient has had no recurrence of his atrial fibrillation.  He remains on amiodarone 200 mg daily.  He had a chest x-ray in July 2015 which was normal.

## 2014-04-05 NOTE — Patient Instructions (Signed)
Your physician wants you to follow-up in: 6 months with Dr. Patty Sermons and complete FASTING lab work. You will receive a reminder letter in the mail two months in advance. If you don't receive a letter, please call our office to schedule the follow-up appointment.  Your physician recommends that you continue on your current medications as directed. Please refer to the Current Medication list given to you today.

## 2014-05-17 ENCOUNTER — Ambulatory Visit (INDEPENDENT_AMBULATORY_CARE_PROVIDER_SITE_OTHER): Payer: BC Managed Care – PPO | Admitting: *Deleted

## 2014-05-17 DIAGNOSIS — I4891 Unspecified atrial fibrillation: Secondary | ICD-10-CM

## 2014-05-17 DIAGNOSIS — Z5181 Encounter for therapeutic drug level monitoring: Secondary | ICD-10-CM

## 2014-05-17 LAB — POCT INR: INR: 2.4

## 2014-05-18 ENCOUNTER — Telehealth: Payer: Self-pay | Admitting: Pulmonary Disease

## 2014-05-18 NOTE — Telephone Encounter (Signed)
CPAP 01/11/14 to 04/10/14 >> used on 90 of 90 nights with average 5 hrs and 17 min.  Average AHI is 8.5 with CPAP 12 cm H2O.  Will have my nurse inform pt that CPAP report shows good control of sleep apnea.  No change to current set up needed.

## 2014-05-18 NOTE — Telephone Encounter (Signed)
lmtcb

## 2014-05-22 NOTE — Telephone Encounter (Signed)
lmtcb

## 2014-05-23 NOTE — Telephone Encounter (Signed)
Results have been explained to patient, pt expressed understanding. Nothing further needed.  

## 2014-06-19 ENCOUNTER — Telehealth: Payer: Self-pay | Admitting: Cardiology

## 2014-06-19 NOTE — Telephone Encounter (Signed)
Hold coumadin clearance faxed to Dr. Beather [facility]'s office, rmf.

## 2014-06-20 ENCOUNTER — Telehealth: Payer: Self-pay | Admitting: Cardiology

## 2014-06-20 NOTE — Telephone Encounter (Signed)
Spoke with pt.  His appt has been rescheduled to 3/21.

## 2014-06-20 NOTE — Telephone Encounter (Signed)
New problem   Pt is having a tooth extracted on 3.10.16 and having to hold his coumadin 5 days b/c and want to know when does he need to r/s his appt. Please call pt.

## 2014-07-09 ENCOUNTER — Ambulatory Visit (INDEPENDENT_AMBULATORY_CARE_PROVIDER_SITE_OTHER): Payer: BC Managed Care – PPO | Admitting: *Deleted

## 2014-07-09 DIAGNOSIS — Z5181 Encounter for therapeutic drug level monitoring: Secondary | ICD-10-CM | POA: Diagnosis not present

## 2014-07-09 DIAGNOSIS — I4891 Unspecified atrial fibrillation: Secondary | ICD-10-CM

## 2014-07-09 LAB — POCT INR: INR: 1.4

## 2014-07-19 ENCOUNTER — Ambulatory Visit (INDEPENDENT_AMBULATORY_CARE_PROVIDER_SITE_OTHER): Payer: BC Managed Care – PPO | Admitting: *Deleted

## 2014-07-19 DIAGNOSIS — I4891 Unspecified atrial fibrillation: Secondary | ICD-10-CM

## 2014-07-19 DIAGNOSIS — Z5181 Encounter for therapeutic drug level monitoring: Secondary | ICD-10-CM | POA: Diagnosis not present

## 2014-07-19 LAB — POCT INR: INR: 1.7

## 2014-08-06 ENCOUNTER — Ambulatory Visit (INDEPENDENT_AMBULATORY_CARE_PROVIDER_SITE_OTHER): Payer: BC Managed Care – PPO | Admitting: *Deleted

## 2014-08-06 DIAGNOSIS — I4891 Unspecified atrial fibrillation: Secondary | ICD-10-CM | POA: Diagnosis not present

## 2014-08-06 DIAGNOSIS — Z5181 Encounter for therapeutic drug level monitoring: Secondary | ICD-10-CM | POA: Diagnosis not present

## 2014-08-06 LAB — POCT INR: INR: 2

## 2014-08-27 ENCOUNTER — Ambulatory Visit (INDEPENDENT_AMBULATORY_CARE_PROVIDER_SITE_OTHER): Payer: BC Managed Care – PPO | Admitting: *Deleted

## 2014-08-27 DIAGNOSIS — Z5181 Encounter for therapeutic drug level monitoring: Secondary | ICD-10-CM | POA: Diagnosis not present

## 2014-08-27 DIAGNOSIS — I4891 Unspecified atrial fibrillation: Secondary | ICD-10-CM

## 2014-08-27 LAB — POCT INR: INR: 2.1

## 2014-09-20 ENCOUNTER — Other Ambulatory Visit: Payer: Self-pay | Admitting: *Deleted

## 2014-09-20 MED ORDER — CARVEDILOL 6.25 MG PO TABS: ORAL_TABLET | ORAL | Status: DC

## 2014-09-25 ENCOUNTER — Ambulatory Visit (INDEPENDENT_AMBULATORY_CARE_PROVIDER_SITE_OTHER): Payer: BC Managed Care – PPO | Admitting: *Deleted

## 2014-09-25 DIAGNOSIS — Z5181 Encounter for therapeutic drug level monitoring: Secondary | ICD-10-CM

## 2014-09-25 DIAGNOSIS — I4891 Unspecified atrial fibrillation: Secondary | ICD-10-CM

## 2014-09-25 LAB — POCT INR: INR: 2.8

## 2014-10-05 ENCOUNTER — Other Ambulatory Visit (INDEPENDENT_AMBULATORY_CARE_PROVIDER_SITE_OTHER): Payer: BC Managed Care – PPO | Admitting: *Deleted

## 2014-10-05 DIAGNOSIS — R5381 Other malaise: Secondary | ICD-10-CM | POA: Diagnosis not present

## 2014-10-05 DIAGNOSIS — R5383 Other fatigue: Secondary | ICD-10-CM

## 2014-10-05 DIAGNOSIS — E7800 Pure hypercholesterolemia, unspecified: Secondary | ICD-10-CM

## 2014-10-05 DIAGNOSIS — I480 Paroxysmal atrial fibrillation: Secondary | ICD-10-CM | POA: Diagnosis not present

## 2014-10-05 LAB — HEPATIC FUNCTION PANEL
ALBUMIN: 3.9 g/dL (ref 3.5–5.2)
ALK PHOS: 65 U/L (ref 39–117)
ALT: 16 U/L (ref 0–53)
AST: 16 U/L (ref 0–37)
Bilirubin, Direct: 0.1 mg/dL (ref 0.0–0.3)
Total Bilirubin: 0.5 mg/dL (ref 0.2–1.2)
Total Protein: 6.6 g/dL (ref 6.0–8.3)

## 2014-10-05 LAB — CBC WITH DIFFERENTIAL/PLATELET
Basophils Absolute: 0 10*3/uL (ref 0.0–0.1)
Basophils Relative: 0.8 % (ref 0.0–3.0)
EOS PCT: 3.7 % (ref 0.0–5.0)
Eosinophils Absolute: 0.2 10*3/uL (ref 0.0–0.7)
HCT: 40.8 % (ref 39.0–52.0)
HEMOGLOBIN: 13.7 g/dL (ref 13.0–17.0)
LYMPHS ABS: 1.3 10*3/uL (ref 0.7–4.0)
Lymphocytes Relative: 27.3 % (ref 12.0–46.0)
MCHC: 33.5 g/dL (ref 30.0–36.0)
MCV: 95 fl (ref 78.0–100.0)
MONOS PCT: 10.1 % (ref 3.0–12.0)
Monocytes Absolute: 0.5 10*3/uL (ref 0.1–1.0)
Neutro Abs: 2.7 10*3/uL (ref 1.4–7.7)
Neutrophils Relative %: 58.1 % (ref 43.0–77.0)
Platelets: 252 10*3/uL (ref 150.0–400.0)
RBC: 4.3 Mil/uL (ref 4.22–5.81)
RDW: 13.7 % (ref 11.5–15.5)
WBC: 4.7 10*3/uL (ref 4.0–10.5)

## 2014-10-05 LAB — BASIC METABOLIC PANEL
BUN: 17 mg/dL (ref 6–23)
CO2: 27 mEq/L (ref 19–32)
CREATININE: 1.22 mg/dL (ref 0.40–1.50)
Calcium: 8.8 mg/dL (ref 8.4–10.5)
Chloride: 105 mEq/L (ref 96–112)
GFR: 63.64 mL/min (ref >60.00)
Glucose, Bld: 98 mg/dL (ref 70–99)
Potassium: 4.1 mEq/L (ref 3.5–5.1)
Sodium: 136 mEq/L (ref 135–145)

## 2014-10-05 LAB — TSH: TSH: 2.14 u[IU]/mL (ref 0.35–4.50)

## 2014-10-07 NOTE — Progress Notes (Signed)
Quick Note:  Please make copy of labs for patient visit. ______ 

## 2014-10-08 ENCOUNTER — Ambulatory Visit (INDEPENDENT_AMBULATORY_CARE_PROVIDER_SITE_OTHER): Payer: BC Managed Care – PPO | Admitting: Cardiology

## 2014-10-08 ENCOUNTER — Encounter: Payer: Self-pay | Admitting: Cardiology

## 2014-10-08 VITALS — BP 112/68 | HR 46 | Ht 76.0 in | Wt 263.0 lb

## 2014-10-08 DIAGNOSIS — E785 Hyperlipidemia, unspecified: Secondary | ICD-10-CM

## 2014-10-08 DIAGNOSIS — G4733 Obstructive sleep apnea (adult) (pediatric): Secondary | ICD-10-CM

## 2014-10-08 DIAGNOSIS — I480 Paroxysmal atrial fibrillation: Secondary | ICD-10-CM | POA: Diagnosis not present

## 2014-10-08 DIAGNOSIS — E7800 Pure hypercholesterolemia, unspecified: Secondary | ICD-10-CM

## 2014-10-08 DIAGNOSIS — Z79899 Other long term (current) drug therapy: Secondary | ICD-10-CM

## 2014-10-08 NOTE — Progress Notes (Signed)
Cardiology Office Note   Date:  10/08/2014   ID:  Ruben [name], DOB [birth year], MRN [id number]  PCP:  Ruben Dials, MD  Cardiologist: Ruben Clement MD  No chief complaint on file.     History of Present Illness: Ruben [name] is a 64 y.o. male who presents for a six-month follow-up office visit.  He has a past history of nonischemic dilated cardiomyopathy and associated atrial fibrillation at that time. He was hospitalized in February 2012 and was critically ill with severe systolic left ventricular dysfunction and atrial fibrillation. He was successfully cardioverted back to sinus rhythm in May 2012. He did well until August 2014 when he went back into atrial fibrillation after we had cut back his amiodarone to just 100 mg daily. His dose was increased and we cardioverted him successfully on 03/02/13. Since then he has maintained normal sinus rhythm. His echocardiogram in 01/14/13 showed that his ejection fraction had improved to 50-55%. Since last visit has had no new cardiac symptoms. He has a history of sleep apnea and uses a CPAP machine successfully. Recent lab work shows no evidence of thyroid or hepatic dysfunction and he is not anemic.  He is scheduled for a routine colonoscopy soon.  He is in normal sinus rhythm.  He may leave off his warfarin for up to 5 days prior to the colonoscopy.  He does not require bridging.  The patient has a history of hypercholesterolemia.  He is not experiencing any myalgias.   Past Medical History  Diagnosis Date  . Non-ischemic cardiomyopathy        . Atrial fibrillation   . OSA (obstructive sleep apnea)   . Cough     Past Surgical History  Procedure Laterality Date  . Foot fracture surgery    . Pilonidal cyst excision    . Cardioversion N/A 03/02/2013    Procedure: CARDIOVERSION;  Surgeon: Ruben Clement, MD;  Location: [facility] ENDOSCOPY;  Service: Cardiovascular;  Laterality: N/A;     Current Outpatient  Prescriptions  Medication Sig Dispense Refill  . amiodarone (PACERONE) 200 MG tablet Take 200 mg by mouth daily.    . carvedilol (COREG) 6.25 MG tablet 1/2 TABLET TWICE A DAY 45 tablet 1  . hydrALAZINE (APRESOLINE) 25 MG tablet Take 12.5 mg by mouth 3 (three) times daily.    Marland Kitchen lisinopril (PRINIVIL,ZESTRIL) 5 MG tablet Take 5 mg by mouth daily.    . pravastatin (PRAVACHOL) 20 MG tablet Take 20 mg by mouth daily.    Marland Kitchen warfarin (COUMADIN) 2 MG tablet TAKE AS DIRECTED BY ANTICOAGULATION CLINIC 180 tablet 1   No current facility-administered medications for this visit.    Allergies:   Review of patient's allergies indicates no known allergies.    Social History:  The patient  reports that he has never smoked. He has never used smokeless tobacco. He reports that he does not drink alcohol or use illicit drugs.   Family History:  The patient's family history includes Cancer in his mother; Hypertension in his mother; Memory loss in his mother; Prostate cancer in his father; Stroke in his mother.    ROS:  Please see the history of present illness.   Otherwise, review of systems are positive for none.   All other systems are reviewed and negative.    PHYSICAL EXAM: VS:  BP 112/68 mmHg  Pulse 46  Ht 6\' 4"  (1.93 m)  Wt 263 lb (119.296 kg)  BMI 32.03 kg/m2 , BMI Body mass index  is 32.03 kg/(m^2). GEN: Well nourished, well developed, in no acute distress HEENT: normal Neck: no JVD, carotid bruits, or masses Cardiac: RRR; no murmurs, rubs, or gallops,no edema  Respiratory:  clear to auscultation bilaterally, normal work of breathing GI: soft, nontender, nondistended, + BS MS: no deformity or atrophy Skin: warm and dry, no rash Neuro:  Strength and sensation are intact Psych: euthymic mood, full affect   EKG:  EKG is ordered today. The ekg ordered today demonstrates sinus bradycardia with occasional PVC.  Low voltage QRS.  Nonspecific ST-T wave changes.  Since prior tracing of 04/05/14, no  significant change.   Recent Labs: 10/05/2014: ALT 16; BUN 17; Creatinine, Ser 1.22; Hemoglobin 13.7; Platelets 252.0; Potassium 4.1; Sodium 136; TSH 2.14    Lipid Panel    Component Value Date/Time   CHOL 167 02/26/2014 0748   TRIG 107.0 02/26/2014 0748   HDL 39.60 02/26/2014 0748   CHOLHDL 4 02/26/2014 0748   VLDL 21.4 02/26/2014 0748   LDLCALC 106* 02/26/2014 0748   LDLDIRECT 90.2 12/30/2012 0740      Wt Readings from Last 3 Encounters:  10/08/14 263 lb (119.296 kg)  04/05/14 259 lb (117.482 kg)  03/30/14 262 lb 9.6 oz (119.115 kg)        ASSESSMENT AND PLAN:  1. 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 Subsequent gradual improvement of ejection fraction back to normal levels. 2. paroxysmal atrial fibrillation, on long-term amiodarone and warfarin. Maintaining normal sinus rhythm. Last cardioverted 03/02/13. 3. Dyslipidemia  Plan: Continue same medication. Recheck at 6 month intervals. Return for office visit EKG lipid panel hepatic function panel basal metabolic panel and CBC, TSH and free T4.   Current medicines are reviewed at length with the patient today.  The patient does not have concerns regarding medicines.  The following changes have been made:  no change  Labs/ tests ordered today include:   Orders Placed This Encounter  Procedures  . Lipid panel  . Hepatic function panel  . Basic metabolic panel  . TSH  . T4, free  . CBC with Differential/Platelet  . EKG 12-Lead      Signed, Ruben Clement MD 10/08/2014 8:46 AM    [facility] [address] [place], [name], [place]  [address] Phone: [phone]; Fax: [phone]

## 2014-10-08 NOTE — Patient Instructions (Addendum)
Medication Instructions:  Your physician recommends that you continue on your current medications as directed. Please refer to the Current Medication list given to you today.  Labwork: none3  Testing/Procedures: none  Follow-Up: Your physician wants you to follow-up in: 6 months with fasting labs (lp/bmet/hfp/tsh/ft4/cbc)  And ekg You will receive a reminder letter in the mail two months in advance. If you don't receive a letter, please call our office to schedule the follow-up appointment.

## 2014-10-16 IMAGING — CR DG CHEST 2V
2 series · 2 of 2 positions shown · non-contrast
Comparison: July 10, 2011.

CLINICAL DATA: Atrial fibrillation.

EXAM:
CHEST  2 VIEW

[w chest pa]
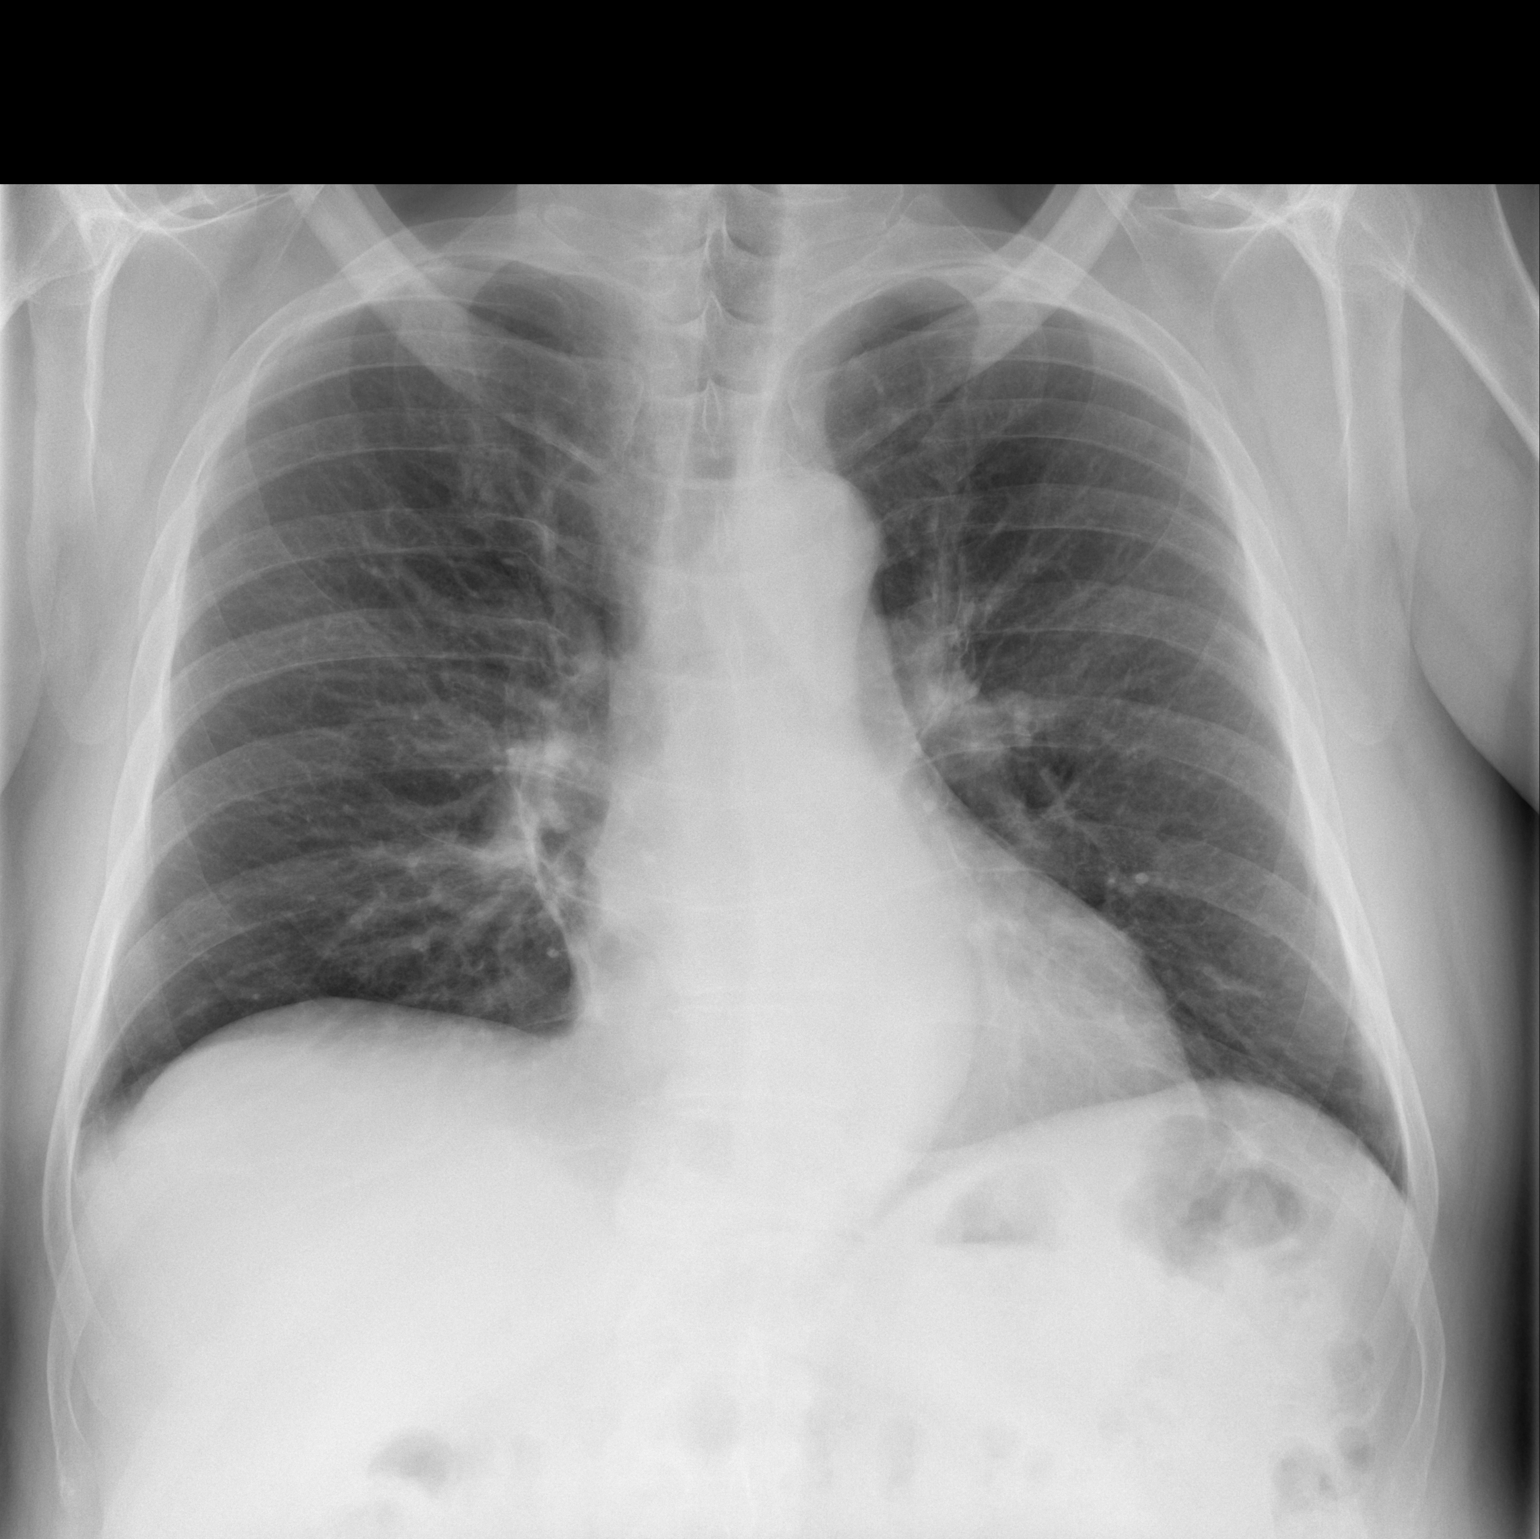

[w chest lat]
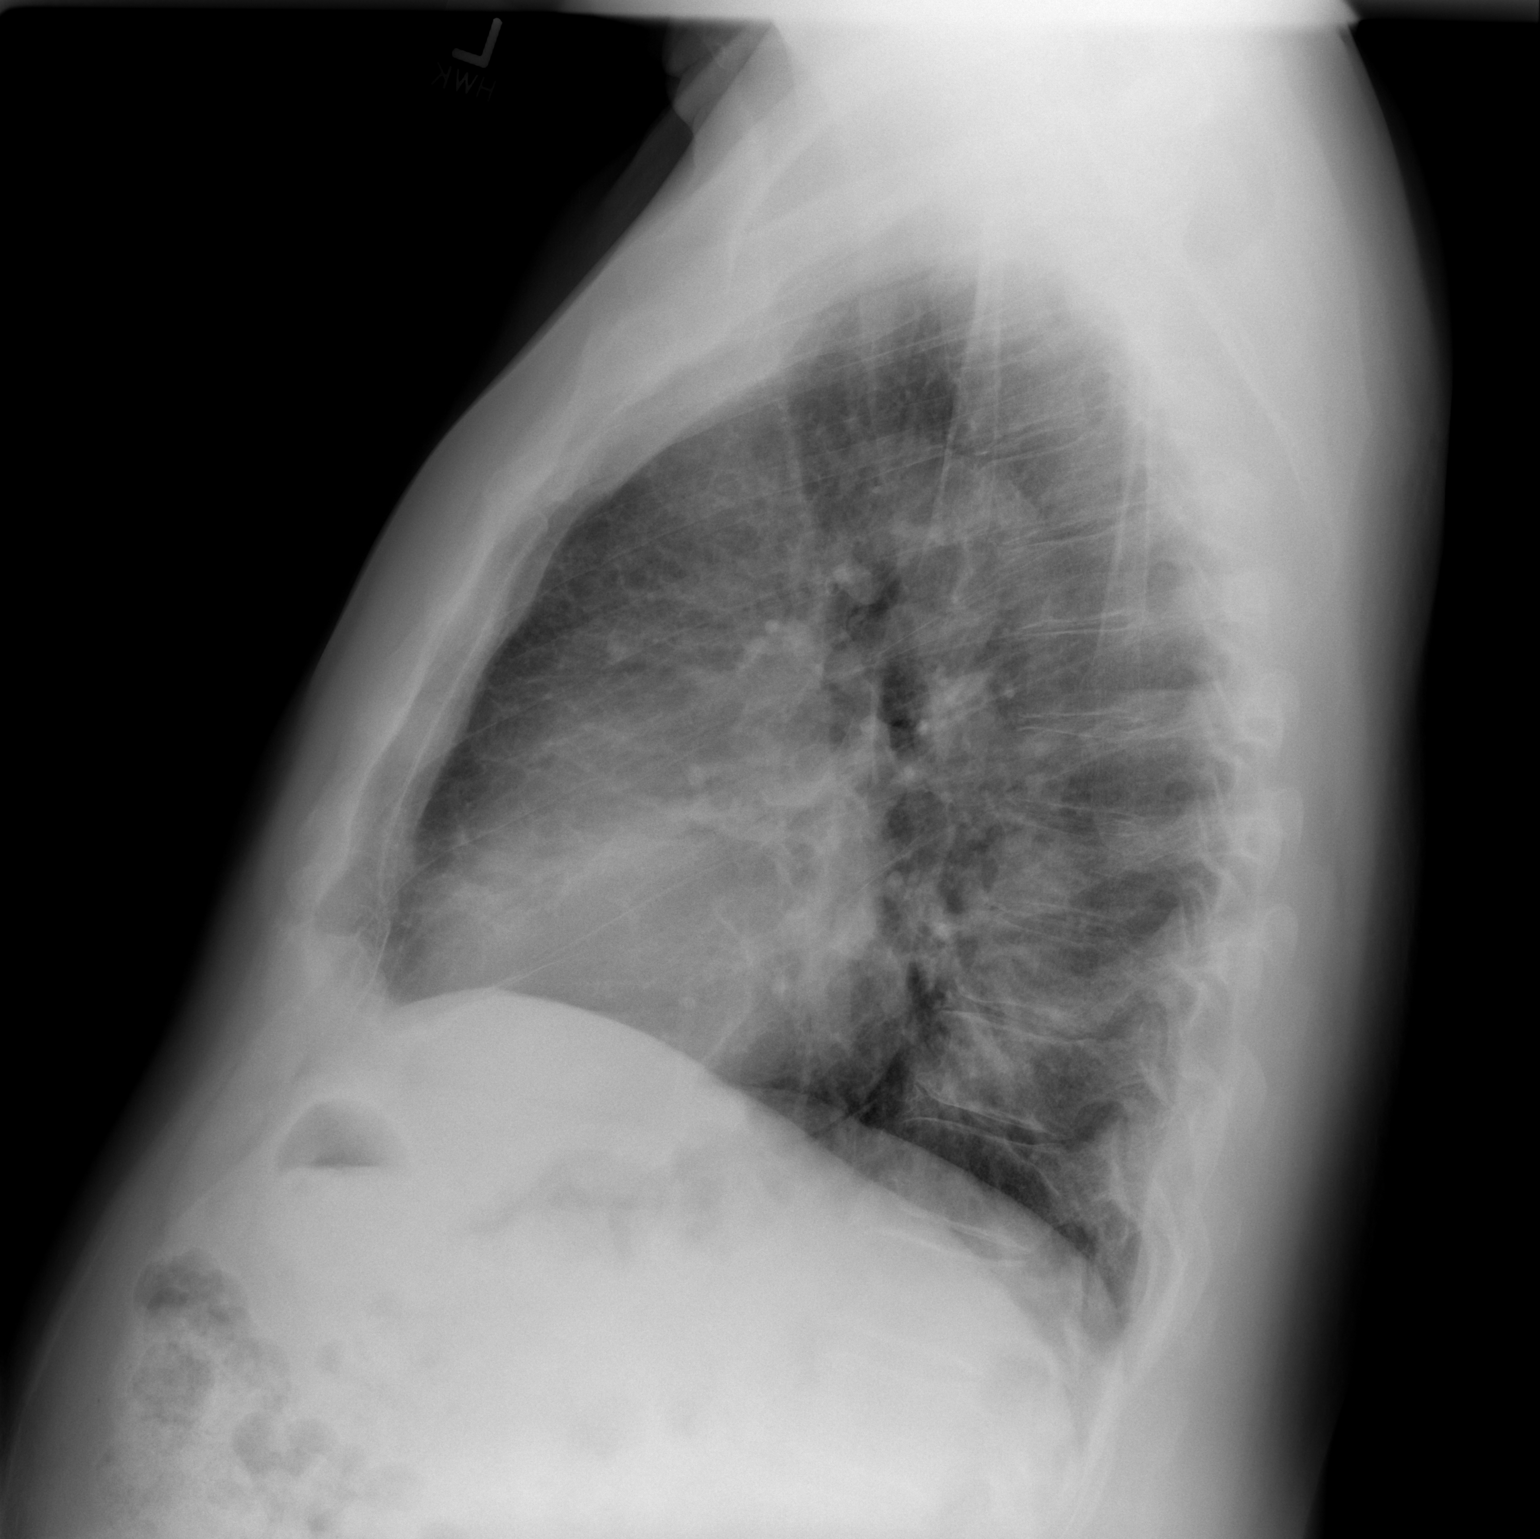

[2 of 2 positions shown; findings below may reference images not displayed]

FINDINGS: The heart size and mediastinal contours are within normal limits.
Both lungs are clear. The visualized skeletal structures are
unremarkable.
IMPRESSION: No acute cardiopulmonary abnormality seen.

## 2014-10-20 ENCOUNTER — Other Ambulatory Visit: Payer: Self-pay | Admitting: Cardiology

## 2014-10-23 ENCOUNTER — Other Ambulatory Visit: Payer: Self-pay

## 2014-10-23 ENCOUNTER — Ambulatory Visit (INDEPENDENT_AMBULATORY_CARE_PROVIDER_SITE_OTHER): Payer: BC Managed Care – PPO | Admitting: Pharmacist Clinician (PhC)/ Clinical Pharmacy Specialist

## 2014-10-23 DIAGNOSIS — Z5181 Encounter for therapeutic drug level monitoring: Secondary | ICD-10-CM

## 2014-10-23 DIAGNOSIS — I4891 Unspecified atrial fibrillation: Secondary | ICD-10-CM

## 2014-10-23 LAB — POCT INR: INR: 2.8

## 2014-10-23 MED ORDER — LISINOPRIL 5 MG PO TABS: 5.0000 mg | ORAL_TABLET | Freq: Every day | ORAL | Status: DC

## 2014-11-09 ENCOUNTER — Other Ambulatory Visit: Payer: Self-pay | Admitting: Cardiology

## 2014-11-20 ENCOUNTER — Ambulatory Visit (INDEPENDENT_AMBULATORY_CARE_PROVIDER_SITE_OTHER): Payer: BC Managed Care – PPO | Admitting: *Deleted

## 2014-11-20 DIAGNOSIS — Z5181 Encounter for therapeutic drug level monitoring: Secondary | ICD-10-CM | POA: Diagnosis not present

## 2014-11-20 DIAGNOSIS — I4891 Unspecified atrial fibrillation: Secondary | ICD-10-CM | POA: Diagnosis not present

## 2014-11-20 LAB — POCT INR: INR: 2.7

## 2014-12-15 ENCOUNTER — Other Ambulatory Visit: Payer: Self-pay | Admitting: Cardiology

## 2014-12-29 ENCOUNTER — Other Ambulatory Visit: Payer: Self-pay | Admitting: Cardiology

## 2015-01-01 ENCOUNTER — Ambulatory Visit (INDEPENDENT_AMBULATORY_CARE_PROVIDER_SITE_OTHER): Payer: BC Managed Care – PPO | Admitting: *Deleted

## 2015-01-01 DIAGNOSIS — Z5181 Encounter for therapeutic drug level monitoring: Secondary | ICD-10-CM | POA: Diagnosis not present

## 2015-01-01 DIAGNOSIS — I4891 Unspecified atrial fibrillation: Secondary | ICD-10-CM

## 2015-01-01 LAB — POCT INR: INR: 2.6

## 2015-01-05 ENCOUNTER — Other Ambulatory Visit: Payer: Self-pay | Admitting: Cardiology

## 2015-01-22 ENCOUNTER — Ambulatory Visit (INDEPENDENT_AMBULATORY_CARE_PROVIDER_SITE_OTHER): Admitting: Podiatry

## 2015-01-22 ENCOUNTER — Encounter: Payer: Self-pay | Admitting: Podiatry

## 2015-01-22 VITALS — BP 136/69 | HR 52 | Resp 12

## 2015-01-22 DIAGNOSIS — B351 Tinea unguium: Secondary | ICD-10-CM | POA: Diagnosis not present

## 2015-01-22 DIAGNOSIS — M79674 Pain in right toe(s): Secondary | ICD-10-CM | POA: Diagnosis not present

## 2015-01-22 DIAGNOSIS — M79675 Pain in left toe(s): Secondary | ICD-10-CM | POA: Diagnosis not present

## 2015-01-22 NOTE — Progress Notes (Signed)
   Subjective:    Patient ID: Ruben [name], male    DOB: 1951-02-19, 64 y.o.   MRN: [id number]  HPI: He presents today as a 64 year old white male with concern regarding discoloration of the hallux nail plate left. He states that his been discolored like this for approximately year and has tried over-the-counter medications to no avail. His primary care provider suggested that the nail be removed. He is currently on Coumadin for A. fib. He states that the nail looks like it's ready to come off. He states that they are painful particularly the longer the get.    Review of Systems  Musculoskeletal: Positive for back pain and joint swelling.       Objective:   Physical Exam: 64 year old male presents in good demeanor with no apparent distress vital signs stable alert and oriented 3. Pulses are strongly palpable bilateral. Neurologic sensorium is intact per Semmes-Weinstein monofilament. Deep tendon reflexes are intact bilateral and muscle strength +5 over 5 dorsiflexion plantar flexors and inverters everters all intrinsic musculature is intact. Orthopedic evaluation of his drains all joints distal to the ankle for range of motion and crepitation. Cutaneous evaluation of a straight supple well-hydrated cutis his toenails are grossly elongated he has discoloration of the hallux now left with a tearing of the left hallux nail due to its length. He also has a very small abscess to the distal medial corner of the second nail plate. I debrided these nails today removing the majority of the discoloration. I do feel that there is some possible fungus of the hallux nail left I think this could be resolved with routine nail care in the small abscess to the second toe was removed with nail debridement and tissue debridement.        Assessment & Plan:  Onychomycosis hallux left grossly elongated toenails.  Plan: Debridement of nails 1 through 5 bilateral. Follow up with [place] as needed.  Arbutus Ped  DPM

## 2015-02-12 ENCOUNTER — Ambulatory Visit (INDEPENDENT_AMBULATORY_CARE_PROVIDER_SITE_OTHER): Payer: BC Managed Care – PPO | Admitting: *Deleted

## 2015-02-12 DIAGNOSIS — I4891 Unspecified atrial fibrillation: Secondary | ICD-10-CM | POA: Diagnosis not present

## 2015-02-12 DIAGNOSIS — Z5181 Encounter for therapeutic drug level monitoring: Secondary | ICD-10-CM

## 2015-02-12 LAB — POCT INR: INR: 2.9

## 2015-03-26 ENCOUNTER — Other Ambulatory Visit: Payer: Self-pay | Admitting: Cardiology

## 2015-03-27 ENCOUNTER — Ambulatory Visit (INDEPENDENT_AMBULATORY_CARE_PROVIDER_SITE_OTHER): Payer: BC Managed Care – PPO | Admitting: *Deleted

## 2015-03-27 DIAGNOSIS — I4891 Unspecified atrial fibrillation: Secondary | ICD-10-CM

## 2015-03-27 DIAGNOSIS — Z5181 Encounter for therapeutic drug level monitoring: Secondary | ICD-10-CM | POA: Diagnosis not present

## 2015-03-27 LAB — POCT INR: INR: 2.1

## 2015-04-01 ENCOUNTER — Ambulatory Visit (INDEPENDENT_AMBULATORY_CARE_PROVIDER_SITE_OTHER): Payer: BC Managed Care – PPO | Admitting: Adult Health

## 2015-04-01 ENCOUNTER — Encounter: Payer: Self-pay | Admitting: Adult Health

## 2015-04-01 VITALS — BP 110/70 | HR 55 | Temp 98.0°F | Ht 76.0 in | Wt 272.4 lb

## 2015-04-01 DIAGNOSIS — G4733 Obstructive sleep apnea (adult) (pediatric): Secondary | ICD-10-CM

## 2015-04-01 NOTE — Assessment & Plan Note (Signed)
Doing well on nocturnal CPAP. Pt. Is benefiting clinically from CPAP  Plan:  Continue to use your CPAP every night. Try to use it for at least 6 hours a night. Work on weight loss as we know it helps with sleep apnea. Don't drive if sleepy. Request a download . Call patient with results. Follow up with Dr. Craige Cotta in 12 months or as needed

## 2015-04-01 NOTE — Patient Instructions (Addendum)
Continue to use your CPAP every night. Try to use it for at least 6 hours a night. Work on weight loss as we know it helps with sleep apnea. Don't drive if sleepy. Request a download . Call patient with results. Follow up with Dr. Craige Cotta in 12 months or as needed

## 2015-04-01 NOTE — Progress Notes (Signed)
   Subjective:    Patient ID: Ruben [name], male    DOB: 01-Feb-1951, 64 y.o.   MRN: [id number]  HPI : Ruben [name] is a 64 y.o. male with OSA.  He has been doing well with CPAP. He uses this every night.  TESTS: PFT 06/25/10>>FEV1 2.94(77%), FEV1% 73, TLC 6.75(83%), DLCO 70%, no BD  PSG 08/21/10>>AHI 12.5, RDI 24.9, SpO2 low 89%   PMHx >> Non ischemic cardiomyopathy, Atrial fibrillation  PSHx, Medications, Allergies, Fhx, Shx reviewed.  04/01/15: Follow up  OSA.  Pt has been on CPAP since 2012.Doing well and is using it every night per his report. Did not bring chip for download. We will request a download and call patient with results. He will check to make sure machine has working chip, if not will get new chip from home health Doing well with nasal pillows.  Review of Systems  Constitutional:   No  weight loss, night sweats,  Fevers, chills, fatigue, or  lassitude.  HEENT:   No headaches,  Difficulty swallowing,  Tooth/dental problems, or  Sore throat,                No sneezing, itching, ear ache, nasal congestion, post nasal drip,   CV:  No chest pain,  Orthopnea, PND, swelling in lower extremities, anasarca, dizziness, palpitations, syncope.   GI  No heartburn, indigestion, abdominal pain, nausea, vomiting, diarrhea, change in bowel habits, loss of appetite, bloody stools.   Resp: No shortness of breath with exertion or at rest.  No excess mucus, no productive cough,  No non-productive cough,  No coughing up of blood.  No change in color of mucus.  No wheezing.  No chest wall deformity  Skin: no rash or lesions.  GU: no dysuria, change in color of urine, no urgency or frequency.  No flank pain, no hematuria   MS:  No joint pain or swelling.  No decreased range of motion.  No back pain.  Psych:  No change in mood or affect. No depression or anxiety.  No memory loss.          Objective:   Physical Exam   Filed Vitals:   04/01/15 1634  BP: 110/70    Pulse: 55  Temp: 98 F (36.7 C)  TempSrc: Oral  Height: 6\' 4"  (1.93 m)  Weight: 272 lb 6.4 oz (123.56 kg)  SpO2: 96%    GEN: A/Ox3; pleasant , NAD, well nourished   HEENT:  [place]/AT,  EACs-clear, TMs-wnl, NOSE-clear, THROAT-clear, no lesions, no postnasal drip or exudate noted.   NECK:  Supple w/ fair ROM; no JVD; normal carotid impulses w/o bruits; no thyromegaly or nodules palpated; no lymphadenopathy.  RESP  Clear  P & A; w/o, wheezes/ rales/ or rhonchi.no accessory muscle use, no dullness to percussion  CARD:  RRR, no m/r/g  , no peripheral edema, pulses intact, no cyanosis or clubbing.  GI:   Soft & nt; nml bowel sounds; no organomegaly or masses detected.  Musco: Warm bil, no deformities or joint swelling noted.   Neuro: alert, no focal deficits noted.    Skin: Warm, no lesions or rashes        Assessment & Plan:

## 2015-04-02 NOTE — Progress Notes (Addendum)
Reviewed and agree with assessment/plan. 

## 2015-04-23 ENCOUNTER — Other Ambulatory Visit (INDEPENDENT_AMBULATORY_CARE_PROVIDER_SITE_OTHER): Payer: BC Managed Care – PPO | Admitting: *Deleted

## 2015-04-23 DIAGNOSIS — E7800 Pure hypercholesterolemia, unspecified: Secondary | ICD-10-CM | POA: Diagnosis not present

## 2015-04-23 DIAGNOSIS — I480 Paroxysmal atrial fibrillation: Secondary | ICD-10-CM

## 2015-04-23 DIAGNOSIS — E785 Hyperlipidemia, unspecified: Secondary | ICD-10-CM | POA: Diagnosis not present

## 2015-04-23 LAB — LIPID PANEL
CHOLESTEROL: 155 mg/dL (ref 125–200)
HDL: 42 mg/dL (ref >=40)
LDL Cholesterol: 88 mg/dL (ref <130)
TRIGLYCERIDES: 124 mg/dL (ref <150)
Total CHOL/HDL Ratio: 3.7 Ratio (ref <=5.0)
VLDL: 25 mg/dL (ref <30)

## 2015-04-23 LAB — BASIC METABOLIC PANEL
BUN: 19 mg/dL (ref 7–25)
CHLORIDE: 100 mmol/L (ref 98–110)
CO2: 24 mmol/L (ref 20–31)
Calcium: 8.8 mg/dL (ref 8.6–10.3)
Creat: 1.25 mg/dL (ref 0.70–1.25)
GLUCOSE: 100 mg/dL — AB (ref 65–99)
POTASSIUM: 3.7 mmol/L (ref 3.5–5.3)
SODIUM: 133 mmol/L — AB (ref 135–146)

## 2015-04-23 LAB — CBC WITH DIFFERENTIAL/PLATELET
BASOS ABS: 0.1 10*3/uL (ref 0.0–0.1)
BASOS PCT: 1 % (ref 0–1)
Eosinophils Absolute: 0.2 10*3/uL (ref 0.0–0.7)
Eosinophils Relative: 3 % (ref 0–5)
HEMATOCRIT: 40.3 % (ref 39.0–52.0)
HEMOGLOBIN: 13.6 g/dL (ref 13.0–17.0)
LYMPHS PCT: 25 % (ref 12–46)
Lymphs Abs: 1.6 10*3/uL (ref 0.7–4.0)
MCH: 31.5 pg (ref 26.0–34.0)
MCHC: 33.7 g/dL (ref 30.0–36.0)
MCV: 93.3 fL (ref 78.0–100.0)
MONO ABS: 0.7 10*3/uL (ref 0.1–1.0)
MPV: 9.8 fL (ref 8.6–12.4)
Monocytes Relative: 11 % (ref 3–12)
NEUTROS ABS: 3.8 10*3/uL (ref 1.7–7.7)
NEUTROS PCT: 60 % (ref 43–77)
Platelets: 299 10*3/uL (ref 150–400)
RBC: 4.32 MIL/uL (ref 4.22–5.81)
RDW: 13 % (ref 11.5–15.5)
WBC: 6.4 10*3/uL (ref 4.0–10.5)

## 2015-04-23 LAB — TSH: TSH: 2.404 u[IU]/mL (ref 0.350–4.500)

## 2015-04-23 LAB — T4, FREE: FREE T4: 1.48 ng/dL (ref 0.80–1.80)

## 2015-04-23 LAB — HEPATIC FUNCTION PANEL
ALBUMIN: 3.7 g/dL (ref 3.6–5.1)
ALT: 19 U/L (ref 9–46)
AST: 18 U/L (ref 10–35)
Alkaline Phosphatase: 70 U/L (ref 40–115)
BILIRUBIN DIRECT: 0.1 mg/dL (ref <=0.2)
Indirect Bilirubin: 0.4 mg/dL (ref 0.2–1.2)
Total Bilirubin: 0.5 mg/dL (ref 0.2–1.2)
Total Protein: 6.7 g/dL (ref 6.1–8.1)

## 2015-04-23 NOTE — Progress Notes (Signed)
Quick Note:  Please make copy of labs for patient visit. ______ 

## 2015-04-23 NOTE — Addendum Note (Signed)
Addended by: Tonita Phoenix on: 04/23/2015 08:05 AM   Modules accepted: Orders

## 2015-04-23 NOTE — Addendum Note (Signed)
Addended by: Tonita Phoenix on: 04/23/2015 08:06 AM   Modules accepted: Orders

## 2015-04-25 ENCOUNTER — Ambulatory Visit (INDEPENDENT_AMBULATORY_CARE_PROVIDER_SITE_OTHER): Payer: BC Managed Care – PPO | Admitting: Cardiology

## 2015-04-25 ENCOUNTER — Encounter: Payer: Self-pay | Admitting: Cardiology

## 2015-04-25 ENCOUNTER — Ambulatory Visit (INDEPENDENT_AMBULATORY_CARE_PROVIDER_SITE_OTHER): Payer: BC Managed Care – PPO | Admitting: *Deleted

## 2015-04-25 VITALS — BP 118/64 | HR 49 | Ht 76.0 in | Wt 270.0 lb

## 2015-04-25 DIAGNOSIS — I480 Paroxysmal atrial fibrillation: Secondary | ICD-10-CM | POA: Diagnosis not present

## 2015-04-25 DIAGNOSIS — Z5181 Encounter for therapeutic drug level monitoring: Secondary | ICD-10-CM

## 2015-04-25 DIAGNOSIS — E7800 Pure hypercholesterolemia, unspecified: Secondary | ICD-10-CM

## 2015-04-25 DIAGNOSIS — I4891 Unspecified atrial fibrillation: Secondary | ICD-10-CM

## 2015-04-25 DIAGNOSIS — Z7901 Long term (current) use of anticoagulants: Secondary | ICD-10-CM | POA: Diagnosis not present

## 2015-04-25 LAB — POCT INR: INR: 2.6

## 2015-04-25 NOTE — Patient Instructions (Signed)
Medication Instructions:  Your physician recommends that you continue on your current medications as directed. Please refer to the Current Medication list given to you today.  Labwork: none  Testing/Procedures: none  Follow-Up: Your physician wants you to follow-up in: 6 months with fasting labs (lp/bmet/hfp/tsh) with Dr Prescott Parma will receive a reminder letter in the mail two months in advance. If you don't receive a letter, please call our office to schedule the follow-up appointment.  If you need a refill on your cardiac medications before your next appointment, please call your pharmacy.

## 2015-04-25 NOTE — Progress Notes (Signed)
Cardiology Office Note   Date:  04/25/2015   ID:  Ruben [name], DOB 09-10-1950, MRN [id number]  PCP:  Aura Dials, MD  Cardiologist: [name] MD  Chief Complaint  Patient presents with  . Atrial Fibrillation      History of Present Illness: Ruben [name] is a 65 y.o. male who presents for Six-month follow-up visit    He has a past history of nonischemic dilated cardiomyopathy and associated atrial fibrillation at that time. He was hospitalized in February 2012 and was critically ill with severe systolic left ventricular dysfunction and atrial fibrillation. He was successfully cardioverted back to sinus rhythm in May 2012. He did well until August 2014 when he went back into atrial fibrillation after we had cut back his amiodarone to just 100 mg daily. His dose was increased and we cardioverted him successfully on 03/02/13. Since then he has maintained normal sinus rhythm. His echocardiogram in 01/14/13 showed that his ejection fraction had improved to 50-55%. Since last visit has had no new cardiac symptoms. He has a history of sleep apnea and uses a CPAP machine successfully.  The patient has a history of hypercholesterolemia. He is not experiencing any myalgias. Recent lab work includes normal hepatic function and normal thyroid function  He has had a slight cough which is resolving on Mucinex.He has gained weight.  His family gave him a fit.bit  And he is trying to get more exercise to help him lose weight.  Past Medical History  Diagnosis Date  . Non-ischemic cardiomyopathy (HCC)        . Atrial fibrillation (HCC)   . OSA (obstructive sleep apnea)   . Cough     Past Surgical History  Procedure Laterality Date  . Foot fracture surgery    . Pilonidal cyst excision    . Cardioversion N/A 03/02/2013    Procedure: CARDIOVERSION;  Surgeon: [name], MD;  [facility]: [facility] ENDOSCOPY;  Service: Cardiovascular;  Laterality: N/A;     Current  Outpatient Prescriptions  Medication Sig Dispense Refill  . amiodarone (PACERONE) 200 MG tablet Take 200 mg by mouth daily.    . carvedilol (COREG) 6.25 MG tablet TAKE ONE-HALF TABLET BY MOUTH TWICE DAILY 45 tablet 6  . hydrALAZINE (APRESOLINE) 25 MG tablet Take 12.5 mg by mouth 3 (three) times daily.    Marland Kitchen lisinopril (PRINIVIL,ZESTRIL) 5 MG tablet Take 1 tablet (5 mg total) by mouth daily. 30 tablet 11  . pravastatin (PRAVACHOL) 20 MG tablet Take 20 mg by mouth daily.    Marland Kitchen warfarin (COUMADIN) 2 MG tablet TAKE AS DIRECTED BY ANTICOAGULATION CLINIC 180 tablet 1   No current facility-administered medications for this visit.    Allergies:   Review of patient's allergies indicates no known allergies.    Social History:  The patient  reports that he has never smoked. He has never used smokeless tobacco. He reports that he does not drink alcohol or use illicit drugs.   Family History:  The patient's family history includes Cancer in his mother; Hypertension in his mother; Memory loss in his mother; Prostate cancer in his father; Stroke in his mother.    ROS:  Please see the history of present illness.   Otherwise, review of systems are positive for none.   All other systems are reviewed and negative.    PHYSICAL EXAM: VS:  BP 118/64 mmHg  Pulse 49  Ht 6\' 4"  (1.93 m)  Wt 270 lb (122.471 kg)  BMI 32.88 kg/m2 ,  BMI Body mass index is 32.88 kg/(m^2). GEN: Well nourished, well developed, in no acute distress HEENT: normal Neck: no JVD, carotid bruits, or masses Cardiac: RRR; no murmurs, rubs, or gallops,no edema  Respiratory:  clear to auscultation bilaterally, normal work of breathing GI: soft, nontender, nondistended, + BS MS: no deformity or atrophy Skin: warm and dry, no rash Neuro:  Strength and sensation are intact Psych: euthymic mood, full affect   EKG:  EKG is ordered today. The ekg ordered today demonstrates Marked sinus bradycardia at 49 bpm.  T-wave abnormality consider  inferior ischemia.   Recent Labs: 04/23/2015: ALT 19; BUN 19; Creat 1.25; Hemoglobin 13.6; Platelets 299; Potassium 3.7; Sodium 133*; TSH 2.404    Lipid Panel    Component Value Date/Time   CHOL 155 04/23/2015 0806   TRIG 124 04/23/2015 0806   HDL 42 04/23/2015 0806   CHOLHDL 3.7 04/23/2015 0806   VLDL 25 04/23/2015 0806   LDLCALC 88 04/23/2015 0806   LDLDIRECT 90.2 12/30/2012 0740      Wt Readings from Last 3 Encounters:  04/25/15 270 lb (122.471 kg)  04/01/15 272 lb 6.4 oz (123.56 kg)  10/08/14 263 lb (119.296 kg)         ASSESSMENT AND PLAN:  1. 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 Subsequent gradual improvement of ejection fraction back to normal levels. 2. paroxysmal atrial fibrillation, on long-term amiodarone and warfarin. Maintaining normal sinus rhythm. Last cardioverted 03/02/13.His prothrombin time today is satisfactory.  He is not having any bleeding problems from the long-term anticoagulation. 3. Dyslipidemia.We will continue pravastatin.  He is not having any myalgias Plan: Continue same medication. Recheck at 6 month intervals. Return for office visit EKG lipid panel hepatic function panel basal metabolic panel and CBC, TSH and free T4.After my retirement he will follow-up with Dr. [name].   Current medicines are reviewed at length with the patient today.  The patient does not have concerns regarding medicines.  The following changes have been made:  no change  Labs/ tests ordered today include:  No orders of the defined types were placed in this encounter.      Karie Schwalbe MD 04/25/2015 8:27 AM    [facility] [address] [place], [place], [place]  [address] Phone: [phone]; Fax: [phone]

## 2015-04-25 NOTE — Addendum Note (Signed)
Addended by: Regis Bill B on: 04/25/2015 08:44 AM   Modules accepted: Orders

## 2015-04-26 ENCOUNTER — Other Ambulatory Visit: Payer: Self-pay | Admitting: Cardiology

## 2015-06-06 ENCOUNTER — Ambulatory Visit (INDEPENDENT_AMBULATORY_CARE_PROVIDER_SITE_OTHER): Payer: BC Managed Care – PPO | Admitting: Pharmacist

## 2015-06-06 DIAGNOSIS — I4891 Unspecified atrial fibrillation: Secondary | ICD-10-CM

## 2015-06-06 DIAGNOSIS — Z5181 Encounter for therapeutic drug level monitoring: Secondary | ICD-10-CM

## 2015-06-06 LAB — POCT INR: INR: 2.7

## 2015-07-18 ENCOUNTER — Ambulatory Visit (INDEPENDENT_AMBULATORY_CARE_PROVIDER_SITE_OTHER): Payer: BC Managed Care – PPO | Admitting: *Deleted

## 2015-07-18 DIAGNOSIS — I4891 Unspecified atrial fibrillation: Secondary | ICD-10-CM

## 2015-07-18 DIAGNOSIS — Z5181 Encounter for therapeutic drug level monitoring: Secondary | ICD-10-CM | POA: Diagnosis not present

## 2015-07-18 LAB — POCT INR: INR: 3

## 2015-07-20 ENCOUNTER — Other Ambulatory Visit: Payer: Self-pay | Admitting: Cardiology

## 2015-09-10 ENCOUNTER — Other Ambulatory Visit: Payer: Self-pay | Admitting: *Deleted

## 2015-09-10 MED ORDER — CARVEDILOL 6.25 MG PO TABS: 3.1250 mg | ORAL_TABLET | Freq: Two times a day (BID) | ORAL | Status: DC

## 2015-09-12 ENCOUNTER — Ambulatory Visit (INDEPENDENT_AMBULATORY_CARE_PROVIDER_SITE_OTHER): Payer: BC Managed Care – PPO | Admitting: *Deleted

## 2015-09-12 DIAGNOSIS — Z5181 Encounter for therapeutic drug level monitoring: Secondary | ICD-10-CM | POA: Diagnosis not present

## 2015-09-12 DIAGNOSIS — I4891 Unspecified atrial fibrillation: Secondary | ICD-10-CM | POA: Diagnosis not present

## 2015-09-12 LAB — POCT INR: INR: 3.1

## 2015-09-24 ENCOUNTER — Other Ambulatory Visit: Payer: Self-pay | Admitting: *Deleted

## 2015-09-24 MED ORDER — AMIODARONE HCL 200 MG PO TABS: 200.0000 mg | ORAL_TABLET | Freq: Every day | ORAL | Status: DC

## 2015-10-23 ENCOUNTER — Other Ambulatory Visit: Payer: Self-pay | Admitting: *Deleted

## 2015-10-23 ENCOUNTER — Other Ambulatory Visit: Payer: BC Managed Care – PPO

## 2015-10-23 MED ORDER — LISINOPRIL 5 MG PO TABS
5.0000 mg | ORAL_TABLET | Freq: Every day | ORAL | Status: DC
Start: 2015-10-23 — End: 2016-04-18

## 2015-10-25 ENCOUNTER — Ambulatory Visit: Payer: BC Managed Care – PPO | Admitting: Cardiovascular Disease

## 2015-10-31 ENCOUNTER — Other Ambulatory Visit: Payer: BC Managed Care – PPO

## 2015-11-04 ENCOUNTER — Ambulatory Visit: Payer: BC Managed Care – PPO | Admitting: Cardiovascular Disease

## 2015-11-05 ENCOUNTER — Encounter (INDEPENDENT_AMBULATORY_CARE_PROVIDER_SITE_OTHER): Payer: Self-pay

## 2015-11-05 ENCOUNTER — Ambulatory Visit (INDEPENDENT_AMBULATORY_CARE_PROVIDER_SITE_OTHER): Payer: BC Managed Care – PPO | Admitting: *Deleted

## 2015-11-05 ENCOUNTER — Other Ambulatory Visit (INDEPENDENT_AMBULATORY_CARE_PROVIDER_SITE_OTHER): Payer: BC Managed Care – PPO | Admitting: *Deleted

## 2015-11-05 ENCOUNTER — Other Ambulatory Visit: Payer: BC Managed Care – PPO

## 2015-11-05 DIAGNOSIS — Z5181 Encounter for therapeutic drug level monitoring: Secondary | ICD-10-CM

## 2015-11-05 DIAGNOSIS — Z7901 Long term (current) use of anticoagulants: Secondary | ICD-10-CM

## 2015-11-05 DIAGNOSIS — E7800 Pure hypercholesterolemia, unspecified: Secondary | ICD-10-CM | POA: Diagnosis not present

## 2015-11-05 DIAGNOSIS — I4891 Unspecified atrial fibrillation: Secondary | ICD-10-CM | POA: Diagnosis not present

## 2015-11-05 LAB — HEPATIC FUNCTION PANEL
ALBUMIN: 3.7 g/dL (ref 3.6–5.1)
ALT: 16 U/L (ref 9–46)
AST: 17 U/L (ref 10–35)
Alkaline Phosphatase: 71 U/L (ref 40–115)
BILIRUBIN TOTAL: 0.5 mg/dL (ref 0.2–1.2)
Bilirubin, Direct: 0.3 mg/dL — ABNORMAL HIGH (ref <=0.2)
Indirect Bilirubin: 0.2 mg/dL (ref 0.2–1.2)
Total Protein: 6.3 g/dL (ref 6.1–8.1)

## 2015-11-05 LAB — BASIC METABOLIC PANEL
BUN: 15 mg/dL (ref 7–25)
CALCIUM: 8.4 mg/dL — AB (ref 8.6–10.3)
CHLORIDE: 108 mmol/L (ref 98–110)
CO2: 22 mmol/L (ref 20–31)
Creat: 1.27 mg/dL — ABNORMAL HIGH (ref 0.70–1.25)
Glucose, Bld: 106 mg/dL — ABNORMAL HIGH (ref 65–99)
Potassium: 4 mmol/L (ref 3.5–5.3)
SODIUM: 140 mmol/L (ref 135–146)

## 2015-11-05 LAB — LIPID PANEL
CHOL/HDL RATIO: 3.4 ratio (ref <=5.0)
CHOLESTEROL: 155 mg/dL (ref 125–200)
HDL: 45 mg/dL (ref >=40)
LDL Cholesterol: 79 mg/dL (ref <130)
TRIGLYCERIDES: 153 mg/dL — AB (ref <150)
VLDL: 31 mg/dL — AB (ref <30)

## 2015-11-05 LAB — POCT INR: INR: 2.8

## 2015-11-05 LAB — TSH: TSH: 2.06 mIU/L (ref 0.40–4.50)

## 2015-11-05 NOTE — Addendum Note (Signed)
Addended by: Arcola Jansky on: 11/05/2015 07:46 AM   Modules accepted: Orders

## 2015-11-07 ENCOUNTER — Ambulatory Visit (INDEPENDENT_AMBULATORY_CARE_PROVIDER_SITE_OTHER): Payer: BC Managed Care – PPO | Admitting: Cardiovascular Disease

## 2015-11-07 ENCOUNTER — Encounter: Payer: Self-pay | Admitting: Cardiovascular Disease

## 2015-11-07 VITALS — BP 110/54 | HR 51 | Ht 76.0 in | Wt 265.4 lb

## 2015-11-07 DIAGNOSIS — I480 Paroxysmal atrial fibrillation: Secondary | ICD-10-CM

## 2015-11-07 DIAGNOSIS — Z79899 Other long term (current) drug therapy: Secondary | ICD-10-CM | POA: Diagnosis not present

## 2015-11-07 DIAGNOSIS — E785 Hyperlipidemia, unspecified: Secondary | ICD-10-CM

## 2015-11-07 DIAGNOSIS — I5030 Unspecified diastolic (congestive) heart failure: Secondary | ICD-10-CM | POA: Insufficient documentation

## 2015-11-07 DIAGNOSIS — I5022 Chronic systolic (congestive) heart failure: Secondary | ICD-10-CM | POA: Diagnosis not present

## 2015-11-07 DIAGNOSIS — G4733 Obstructive sleep apnea (adult) (pediatric): Secondary | ICD-10-CM

## 2015-11-07 HISTORY — DX: Unspecified diastolic (congestive) heart failure: I50.30

## 2015-11-07 MED ORDER — AMIODARONE HCL 100 MG PO TABS: 100.0000 mg | ORAL_TABLET | Freq: Every day | ORAL | Status: DC

## 2015-11-07 NOTE — Patient Instructions (Signed)
Medication Instructions:  DECREASE Amiodarone to 100 mg daily   Labwork: Your physician recommends that you return for lab work in: 6 months on the day of or a few days before your office visit with Dr. [name].  You will need to FAST for this appointment - nothing to eat or drink after midnight the night before except water.   Testing/Procedures: Your physician has recommended that you have a pulmonary function test. Pulmonary Function Tests are a group of tests that measure how well air moves in and out of your lungs.   Follow-Up: Your physician wants you to follow-up in: 6 months with Dr. [name].  You will receive a reminder letter in the mail two months in advance. If you don't receive a letter, please call our office to schedule the follow-up appointment.   If you need a refill on your cardiac medications before your next appointment, please call your pharmacy.   Thank you for choosing [facility]! Eligha Bridegroom, RN [phone]

## 2015-11-07 NOTE — Progress Notes (Signed)
Cardiology Office Note   Date:  11/07/2015   ID:  Ruben [name], DOB 1951-02-12, MRN [id number]  PCP:  Ruben Inches, MD  Cardiologist:  previous Ruben Coco MD,  Ruben Moores, MD   Chief Complaint  Patient presents with  . Follow-up    atrial fib   Problem list 1. Chronic systolic congestive heart failure-nonischemic dilated cardio myopathy 2.  Atrial fibrillation 3. Obesity 4. Hyperlipidemia 5. Obstructive sleep apnea 6. Mitral valve prolapse - mild MR by echo Sept. 2014    [name] - notes from Ruben [name] is a 65 y.o. male who presents for Six-month follow-up visit    He has a past history of nonischemic dilated cardiomyopathy and associated atrial fibrillation at that time. He was hospitalized in February 2012 and was critically ill with severe systolic left ventricular dysfunction and atrial fibrillation. He was successfully cardioverted back to sinus rhythm in May 2012. He did well until August 2014 when he went back into atrial fibrillation after we had cut back his amiodarone to just 100 mg daily. His dose was increased and we cardioverted him successfully on 03/02/13. Since then he has maintained normal sinus rhythm. His echocardiogram in 01/14/13 showed that his ejection fraction had improved to 50-55%. Since last visit has had no new cardiac symptoms. He has a history of sleep apnea and uses a CPAP machine successfully.  The patient has a history of hypercholesterolemia. He is not experiencing any myalgias. Recent lab work includes normal hepatic function and normal thyroid function  He has had a slight cough which is resolving on Mucinex.He has gained weight.  His family gave him a fit.bit  And he is trying to get more exercise to help him lose weight.  November 07, 2015:  Hx of a viral cardiomyopathy in 2011. Never got over a cold - progressive shortness of breath.  EF was 10% at one point  EF h Ruben [name] is doing well.  Seen for  the first time today - transfer from [place]  Works for the state - [employer] .   No regular exercise .    No Cp ,  Breathing is good.   Past Medical History  Diagnosis Date  . Non-ischemic cardiomyopathy ([place])        . Atrial fibrillation ([place])   . OSA (obstructive sleep apnea)   . Cough     Past Surgical History  Procedure Laterality Date  . Foot fracture surgery    . Pilonidal cyst excision    . Cardioversion N/A 03/02/2013    Procedure: CARDIOVERSION;  Surgeon: Ruben Coco, MD;  Location: [facility] ENDOSCOPY;  Service: Cardiovascular;  Laterality: N/A;     Current Outpatient Prescriptions  Medication Sig Dispense Refill  . amiodarone (PACERONE) 100 MG tablet Take 1 tablet (100 mg total) by mouth daily. 30 tablet 11  . carvedilol (COREG) 6.25 MG tablet Take 0.5 tablets (3.125 mg total) by mouth 2 (two) times daily. 90 tablet 3  . hydrALAZINE (APRESOLINE) 25 MG tablet TAKE ONE-HALF TABLET BY MOUTH THREE TIMES DAILY 135 tablet 3  . lisinopril (PRINIVIL,ZESTRIL) 5 MG tablet Take 1 tablet (5 mg total) by mouth daily. 30 tablet 5  . pravastatin (PRAVACHOL) 20 MG tablet Take 20 mg by mouth daily.    Marland Kitchen warfarin (COUMADIN) 2 MG tablet TAKE AS DIRECTED BY  COUMADIN  CLINIC 160 tablet 1   No current facility-administered medications for this visit.    Allergies:  Review of patient's allergies indicates no known allergies.    Social History:  The patient  reports that he has never smoked. He has never used smokeless tobacco. He reports that he does not drink alcohol or use illicit drugs.   Family History:  The patient's family history includes Cancer in his mother; Hypertension in his mother; Memory loss in his mother; Prostate cancer in his father; Stroke in his mother.    ROS:  Please see the history of present illness.   Otherwise, review of systems are positive for none.   All other systems are reviewed and negative.    PHYSICAL EXAM: VS:  BP 110/54  mmHg  Pulse 51  Ht 6' 4"  (1.93 m)  Wt 265 lb 6.4 oz (120.385 kg)  BMI 32.32 kg/m2  SpO2 95% , BMI Body mass index is 32.32 kg/(m^2). GEN: Well nourished, well developed, in no acute distress HEENT: normal Neck: no JVD, carotid bruits, or masses Cardiac: RRR; 1/6 systolic murmur, rubs, or gallops,no edema  Respiratory:  clear to auscultation bilaterally, normal work of breathing GI: soft, nontender, nondistended, + BS MS: no deformity or atrophy Skin: warm and dry, no rash Neuro:  Strength and sensation are intact Psych: euthymic mood, full affect  EKG:  EKG is ordered today. The ekg ordered today demonstrates Marked sinus bradycardia at 49 bpm.  T-wave abnormality consider inferior ischemia.  Recent Labs: 04/23/2015: Hemoglobin 13.6; Platelets 299 11/05/2015: ALT 16; BUN 15; Creat 1.27*; Potassium 4.0; Sodium 140; TSH 2.06    Lipid Panel    Component Value Date/Time   CHOL 155 11/05/2015 0746   TRIG 153* 11/05/2015 0746   HDL 45 11/05/2015 0746   CHOLHDL 3.4 11/05/2015 0746   VLDL 31* 11/05/2015 0746   LDLCALC 79 11/05/2015 0746   LDLDIRECT 90.2 12/30/2012 0740      Wt Readings from Last 3 Encounters:  11/07/15 265 lb 6.4 oz (120.385 kg)  04/25/15 270 lb (122.471 kg)  04/01/15 272 lb 6.4 oz (123.56 kg)         ASSESSMENT AND PLAN:  1.  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 Subsequent gradual improvement of ejection fraction back to normal levels. Continue same meds.   2. paroxysmal atrial fibrillation,  Will decrease amio to 100 mg a day  Will have him go to coumadin clinic to reschedule his INR check - will need it checked sooner since we are decreasing his amiodarone    3. Dyslipidemia.We will continue pravastatin.  He is not having any myalgias.  Lipids look good  Recheck in 6 months .   4.   Mitral valve prolapse:   Mild MR.   Current medicines are reviewed at length with the  patient today.  The patient does not have concerns regarding medicines.  The following changes have been made:  no change  Labs/ tests ordered today include:   Orders Placed This Encounter  Procedures  . TSH  . Lipid Profile  . Comp Met (CMET)  . Pulmonary function test

## 2015-11-08 ENCOUNTER — Ambulatory Visit: Payer: BC Managed Care – PPO | Admitting: Cardiovascular Disease

## 2015-11-20 ENCOUNTER — Ambulatory Visit (INDEPENDENT_AMBULATORY_CARE_PROVIDER_SITE_OTHER): Payer: BC Managed Care – PPO | Admitting: *Deleted

## 2015-11-20 ENCOUNTER — Encounter (INDEPENDENT_AMBULATORY_CARE_PROVIDER_SITE_OTHER): Payer: BC Managed Care – PPO | Admitting: Internal Medicine

## 2015-11-20 DIAGNOSIS — I4891 Unspecified atrial fibrillation: Secondary | ICD-10-CM | POA: Diagnosis not present

## 2015-11-20 DIAGNOSIS — I5022 Chronic systolic (congestive) heart failure: Secondary | ICD-10-CM | POA: Diagnosis not present

## 2015-11-20 DIAGNOSIS — Z5181 Encounter for therapeutic drug level monitoring: Secondary | ICD-10-CM | POA: Diagnosis not present

## 2015-11-20 DIAGNOSIS — Z79899 Other long term (current) drug therapy: Secondary | ICD-10-CM

## 2015-11-20 LAB — PULMONARY FUNCTION TEST
DL/VA % PRED: 75 %
DL/VA: 3.68 ml/min/mmHg/L
DLCO COR: 24.4 ml/min/mmHg
DLCO cor % pred: 62 %
DLCO unc % pred: 63 %
DLCO unc: 24.93 ml/min/mmHg
FEF 25-75 POST: 2.42 L/s
FEF 25-75 Pre: 3.1 L/sec
FEF2575-%CHANGE-POST: -22 %
FEF2575-%PRED-PRE: 96 %
FEF2575-%Pred-Post: 75 %
FEV1-%CHANGE-POST: -4 %
FEV1-%Pred-Post: 76 %
FEV1-%Pred-Pre: 80 %
FEV1-Post: 3.15 L
FEV1-Pre: 3.31 L
FEV1FVC-%CHANGE-POST: 0 %
FEV1FVC-%PRED-PRE: 105 %
FEV6-%Change-Post: -4 %
FEV6-%PRED-PRE: 79 %
FEV6-%Pred-Post: 75 %
FEV6-Post: 3.96 L
FEV6-Pre: 4.14 L
FEV6FVC-%Change-Post: 1 %
FEV6FVC-%Pred-Post: 105 %
FEV6FVC-%Pred-Pre: 103 %
FVC-%Change-Post: -5 %
FVC-%PRED-POST: 72 %
FVC-%PRED-PRE: 76 %
FVC-POST: 3.96 L
FVC-PRE: 4.2 L
PRE FEV1/FVC RATIO: 79 %
PRE FEV6/FVC RATIO: 99 %
Post FEV1/FVC ratio: 80 %
Post FEV6/FVC ratio: 100 %
RV % pred: 90 %
RV: 2.36 L
TLC % pred: 84 %
TLC: 6.78 L

## 2015-11-20 LAB — POCT INR: INR: 2.4

## 2015-12-11 ENCOUNTER — Encounter (INDEPENDENT_AMBULATORY_CARE_PROVIDER_SITE_OTHER): Payer: Self-pay

## 2015-12-11 ENCOUNTER — Ambulatory Visit (INDEPENDENT_AMBULATORY_CARE_PROVIDER_SITE_OTHER): Payer: BC Managed Care – PPO | Admitting: *Deleted

## 2015-12-11 DIAGNOSIS — I4891 Unspecified atrial fibrillation: Secondary | ICD-10-CM | POA: Diagnosis not present

## 2015-12-11 DIAGNOSIS — Z5181 Encounter for therapeutic drug level monitoring: Secondary | ICD-10-CM

## 2015-12-11 LAB — POCT INR: INR: 3.3

## 2016-01-01 ENCOUNTER — Ambulatory Visit (INDEPENDENT_AMBULATORY_CARE_PROVIDER_SITE_OTHER): Payer: BC Managed Care – PPO | Admitting: *Deleted

## 2016-01-01 DIAGNOSIS — Z5181 Encounter for therapeutic drug level monitoring: Secondary | ICD-10-CM | POA: Diagnosis not present

## 2016-01-01 DIAGNOSIS — I4891 Unspecified atrial fibrillation: Secondary | ICD-10-CM

## 2016-01-01 LAB — POCT INR: INR: 2.7

## 2016-01-28 ENCOUNTER — Ambulatory Visit (INDEPENDENT_AMBULATORY_CARE_PROVIDER_SITE_OTHER): Payer: BC Managed Care – PPO | Admitting: *Deleted

## 2016-01-28 DIAGNOSIS — Z5181 Encounter for therapeutic drug level monitoring: Secondary | ICD-10-CM | POA: Diagnosis not present

## 2016-01-28 DIAGNOSIS — I4891 Unspecified atrial fibrillation: Secondary | ICD-10-CM | POA: Diagnosis not present

## 2016-01-28 LAB — POCT INR: INR: 2.7

## 2016-02-10 ENCOUNTER — Other Ambulatory Visit: Payer: Self-pay | Admitting: Cardiovascular Disease

## 2016-02-25 ENCOUNTER — Ambulatory Visit (INDEPENDENT_AMBULATORY_CARE_PROVIDER_SITE_OTHER): Payer: BC Managed Care – PPO

## 2016-02-25 DIAGNOSIS — I4891 Unspecified atrial fibrillation: Secondary | ICD-10-CM | POA: Diagnosis not present

## 2016-02-25 DIAGNOSIS — Z5181 Encounter for therapeutic drug level monitoring: Secondary | ICD-10-CM

## 2016-02-25 LAB — POCT INR: INR: 2.7

## 2016-03-27 ENCOUNTER — Telehealth: Payer: Self-pay | Admitting: Cardiovascular Disease

## 2016-03-27 NOTE — Telephone Encounter (Signed)
Pt is called some issues accursed last night, he was told to inform his Card.    Please give him a call back.

## 2016-03-27 NOTE — Telephone Encounter (Signed)
Patient states that he was having minor chest pain yesterday afternoon and stopped at a clinic in [facility] He states that they performed an EKG and said that the EKG was normal. He is no longer having any chest pain. He wants to send it to our office for record. I advised him to fax it to [place] and gave him the the fax number. Patient verbalized understanding and is in agreement with the plan.

## 2016-04-07 ENCOUNTER — Ambulatory Visit (INDEPENDENT_AMBULATORY_CARE_PROVIDER_SITE_OTHER): Payer: BC Managed Care – PPO | Admitting: Pharmacist

## 2016-04-07 DIAGNOSIS — Z5181 Encounter for therapeutic drug level monitoring: Secondary | ICD-10-CM | POA: Diagnosis not present

## 2016-04-07 DIAGNOSIS — I4891 Unspecified atrial fibrillation: Secondary | ICD-10-CM

## 2016-04-07 LAB — POCT INR: INR: 2.9

## 2016-04-18 ENCOUNTER — Other Ambulatory Visit: Payer: Self-pay | Admitting: Cardiovascular Disease

## 2016-04-21 ENCOUNTER — Other Ambulatory Visit: Payer: Self-pay

## 2016-04-21 MED ORDER — HYDRALAZINE HCL 25 MG PO TABS: ORAL_TABLET | ORAL | 3 refills | Status: DC

## 2016-04-28 ENCOUNTER — Other Ambulatory Visit: Payer: BC Managed Care – PPO | Admitting: *Deleted

## 2016-04-28 DIAGNOSIS — E7800 Pure hypercholesterolemia, unspecified: Secondary | ICD-10-CM

## 2016-04-28 DIAGNOSIS — I480 Paroxysmal atrial fibrillation: Secondary | ICD-10-CM

## 2016-04-28 NOTE — Progress Notes (Signed)
tsh

## 2016-04-28 NOTE — Addendum Note (Signed)
Addended by: Tonita Phoenix on: 04/28/2016 10:02 AM   Modules accepted: Orders

## 2016-04-29 LAB — COMPREHENSIVE METABOLIC PANEL
ALBUMIN: 3.8 g/dL (ref 3.6–4.8)
ALT: 18 IU/L (ref 0–44)
AST: 16 IU/L (ref 0–40)
Albumin/Globulin Ratio: 1.5 (ref 1.2–2.2)
Alkaline Phosphatase: 78 IU/L (ref 39–117)
BILIRUBIN TOTAL: 0.4 mg/dL (ref 0.0–1.2)
BUN/Creatinine Ratio: 16 (ref 10–24)
BUN: 18 mg/dL (ref 8–27)
CALCIUM: 8.9 mg/dL (ref 8.6–10.2)
CHLORIDE: 103 mmol/L (ref 96–106)
CO2: 24 mmol/L (ref 18–29)
CREATININE: 1.1 mg/dL (ref 0.76–1.27)
GFR calc non Af Amer: 70 mL/min/{1.73_m2} (ref >59)
GFR, EST AFRICAN AMERICAN: 81 mL/min/{1.73_m2} (ref >59)
GLUCOSE: 95 mg/dL (ref 65–99)
Globulin, Total: 2.5 g/dL (ref 1.5–4.5)
Potassium: 4.5 mmol/L (ref 3.5–5.2)
Sodium: 142 mmol/L (ref 134–144)
TOTAL PROTEIN: 6.3 g/dL (ref 6.0–8.5)

## 2016-04-29 LAB — LIPID PANEL
CHOL/HDL RATIO: 4.1 ratio (ref 0.0–5.0)
Cholesterol, Total: 175 mg/dL (ref 100–199)
HDL: 43 mg/dL (ref >39)
LDL Calculated: 101 mg/dL — ABNORMAL HIGH (ref 0–99)
Triglycerides: 154 mg/dL — ABNORMAL HIGH (ref 0–149)
VLDL Cholesterol Cal: 31 mg/dL (ref 5–40)

## 2016-04-29 LAB — TSH: TSH: 2.56 u[IU]/mL (ref 0.450–4.500)

## 2016-05-05 ENCOUNTER — Ambulatory Visit (INDEPENDENT_AMBULATORY_CARE_PROVIDER_SITE_OTHER): Payer: BC Managed Care – PPO | Admitting: Cardiovascular Disease

## 2016-05-05 ENCOUNTER — Encounter: Payer: Self-pay | Admitting: Cardiovascular Disease

## 2016-05-05 VITALS — BP 100/60 | HR 68 | Ht 76.0 in | Wt 267.8 lb

## 2016-05-05 DIAGNOSIS — E782 Mixed hyperlipidemia: Secondary | ICD-10-CM | POA: Diagnosis not present

## 2016-05-05 DIAGNOSIS — I480 Paroxysmal atrial fibrillation: Secondary | ICD-10-CM | POA: Diagnosis not present

## 2016-05-05 DIAGNOSIS — I5022 Chronic systolic (congestive) heart failure: Secondary | ICD-10-CM

## 2016-05-05 MED ORDER — AMIODARONE HCL 100 MG PO TABS: 100.0000 mg | ORAL_TABLET | ORAL | 11 refills | Status: DC

## 2016-05-05 NOTE — Patient Instructions (Signed)
Medication Instructions:  DECREASE Amiodarone to 100 mg on Monday, Wednesday, Friday   Labwork: Your physician recommends that you return for lab work in: 6 months on the day of or a few days before your office visit with Dr. [name].  You will need to FAST for this appointment - nothing to eat or drink after midnight the night before except water.   Testing/Procedures: None Ordered   Follow-Up: Your physician wants you to follow-up in: 6 months with Dr. [name].  You will receive a reminder letter in the mail two months in advance. If you don't receive a letter, please call our office to schedule the follow-up appointment.   If you need a refill on your cardiac medications before your next appointment, please call your pharmacy.   Thank you for choosing [facility]! Eligha Bridegroom, RN [phone]

## 2016-05-05 NOTE — Progress Notes (Signed)
Cardiology Office Note   Date:  1/16/[name]   ID:  JAICION LAURIE, DOB [birth year], MRN [id number]  PCP:  Aura Dials, MD  Cardiologist:  previous [name] MD,  [name], MD   Chief Complaint  Patient presents with  . Follow-up    atrial fib   Problem list 1. 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  Atrial fibrillation 3. Obesity 4. Hyperlipidemia 5. Obstructive sleep apnea 6. Mitral valve prolapse - mild MR by echo Sept. 2014    [name] - notes from [place] is a 66 y.o. male who presents for Six-month follow-up visit    He has a past history of nonischemic dilated cardiomyopathy and associated atrial fibrillation at that time. He was hospitalized in February 2012 and was critically ill with severe systolic left ventricular dysfunction and atrial fibrillation. He was successfully cardioverted back to sinus rhythm in May 2012. He did well until August 2014 when he went back into atrial fibrillation after we had cut back his amiodarone to just 100 mg daily. His dose was increased and we cardioverted him successfully on 03/02/13. Since then he has maintained normal sinus rhythm. His echocardiogram in 01/14/13 showed that his ejection fraction had improved to 50-55%. Since last visit has had no new cardiac symptoms. He has a history of sleep apnea and uses a CPAP machine successfully.  The patient has a history of hypercholesterolemia. He is not experiencing any myalgias. Recent lab work includes normal hepatic function and normal thyroid function  He has had a slight cough which is resolving on Mucinex.He has gained weight.  His family gave him a fit.bit  And he is trying to get more exercise to help him lose weight.  November 07, 2015:  Hx of a viral cardiomyopathy in 2011. Never got over a cold - progressive shortness of breath.  EF was 10% at one point  EF h [name] is doing well.  Seen for  the first time today - transfer from [place]  Works for the state - [employer] .   No regular exercise .    No Cp ,  Breathing is good.  Jan. 16, [name]  Doing well. Has had a cold recently. Has not been exercising .    Past Medical History:  Diagnosis Date  . Atrial fibrillation (HCC)   . Cough   . Non-ischemic cardiomyopathy (HCC)       . OSA (obstructive sleep apnea)     Past Surgical History:  Procedure Laterality Date  . CARDIOVERSION N/A 03/02/2013   Procedure: CARDIOVERSION;  Surgeon: [name], MD;  Location: [facility] ENDOSCOPY;  Service: Cardiovascular;  Laterality: N/A;  . FOOT FRACTURE SURGERY    . PILONIDAL CYST EXCISION       Current Outpatient Prescriptions  Medication Sig Dispense Refill  . amiodarone (PACERONE) 100 MG tablet Take 1 tablet (100 mg total) by mouth daily. 30 tablet 11  . carvedilol (COREG) 6.25 MG tablet Take 0.5 tablets (3.125 mg total) by mouth 2 (two) times daily. 90 tablet 3  . hydrALAZINE (APRESOLINE) 25 MG tablet TAKE ONE-HALF TABLET BY MOUTH THREE TIMES DAILY 135 tablet 3  . lisinopril (PRINIVIL,ZESTRIL) 5 MG tablet TAKE ONE TABLET BY MOUTH ONCE DAILY 30 tablet 5  . pravastatin (PRAVACHOL) 20 MG tablet Take 20 mg by mouth daily.    Marland Kitchen warfarin (COUMADIN) 2 MG tablet TAKE AS DIRECTED BY  COUMADIN  CLINIC 170 tablet 1  No current facility-administered medications for this visit.     Allergies:   Patient has no known allergies.    Social History:  The patient  reports that he has never smoked. He has never used smokeless tobacco. He reports that he does not drink alcohol or use drugs.   Family History:  The patient's family history includes Cancer in his mother; Hypertension in his mother; Memory loss in his mother; Prostate cancer in his father; Stroke in his mother.    ROS:  Please see the history of present illness.   Otherwise, review of systems are positive for none.   All other systems are reviewed and  negative.    PHYSICAL EXAM: VS:  BP 100/60 (BP Location: Left Arm, Patient Position: Sitting, Cuff Size: Large)   Pulse 68   Ht 6\' 4"  (1.93 m)   Wt 267 lb 12.8 oz (121.5 kg)   SpO2 94%   BMI 32.60 kg/m  , BMI Body mass index is 32.6 kg/m. GEN: Well nourished, well developed, in no acute distress  HEENT: normal  Neck: no JVD, carotid bruits, or masses Cardiac: RRR; 1/6 systolic murmur, rubs, or gallops,no edema  Respiratory:  clear to auscultation bilaterally, normal work of breathing GI: soft, nontender, nondistended, + BS MS: no deformity or atrophy  Skin: warm and dry, no rash Neuro:  Strength and sensation are intact Psych: euthymic mood, full affect  EKG:  EKG is ordered today. The ekg ordered today NSR at 68.   Normal ECG   Recent Labs: 1/9/[name]: ALT 18; BUN 18; Creatinine, Ser 1.10; Potassium 4.5; Sodium 142; TSH 2.560    Lipid Panel    Component Value Date/Time   CHOL 175 01/09/[name] 1003   TRIG 154 (H) 01/09/[name] 1003   HDL 43 01/09/[name] 1003   CHOLHDL 4.1 01/09/[name] 1003   CHOLHDL 3.4 11/05/2015 0746   VLDL 31 (H) 11/05/2015 0746   LDLCALC 101 (H) 01/09/[name] 1003   LDLDIRECT 90.2 12/30/2012 0740      Wt Readings from Last 3 Encounters:  05/05/16 267 lb 12.8 oz (121.5 kg)  11/07/15 265 lb 6.4 oz (120.4 kg)  04/25/15 270 lb (122.5 kg)      ASSESSMENT AND PLAN:  1.  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 has been stable    2. paroxysmal atrial fibrillation,  Will decrease amio to 100 mg 3 times a week ( M, W, F)  He needs to have 2 teeth pulled . Will have the coumadin clinic manage his coumadin around the time of the teeth extraction.    3. Dyslipidemia.We will continue pravastatin.  He is not having any myalgias.  Lipids look good from last week.  Recheck in 6 months .   4.   Mitral valve prolapse:   Mild MR.    [name], MD  1/16/[name] 8:34 AM    [facility] [address] [place],  [address] [place], [place]  [address] Pager [phone] Phone: [phone]; Fax: [phone]

## 2016-05-11 ENCOUNTER — Encounter (HOSPITAL_BASED_OUTPATIENT_CLINIC_OR_DEPARTMENT_OTHER): Payer: Self-pay | Admitting: *Deleted

## 2016-05-11 ENCOUNTER — Telehealth: Payer: Self-pay | Admitting: Internal Medicine

## 2016-05-11 ENCOUNTER — Emergency Department (HOSPITAL_BASED_OUTPATIENT_CLINIC_OR_DEPARTMENT_OTHER)
Admission: EM | Admit: 2016-05-11 | Discharge: 2016-05-11 | Disposition: A | Discharge status: Home / Self Care | Payer: BC Managed Care – PPO | Attending: Emergency Medicine | Admitting: Emergency Medicine

## 2016-05-11 DIAGNOSIS — I480 Paroxysmal atrial fibrillation: Secondary | ICD-10-CM | POA: Diagnosis not present

## 2016-05-11 DIAGNOSIS — I509 Heart failure, unspecified: Secondary | ICD-10-CM | POA: Diagnosis not present

## 2016-05-11 DIAGNOSIS — Z7901 Long term (current) use of anticoagulants: Secondary | ICD-10-CM | POA: Diagnosis not present

## 2016-05-11 DIAGNOSIS — R0602 Shortness of breath: Secondary | ICD-10-CM | POA: Diagnosis present

## 2016-05-11 DIAGNOSIS — Z79899 Other long term (current) drug therapy: Secondary | ICD-10-CM | POA: Insufficient documentation

## 2016-05-11 LAB — BASIC METABOLIC PANEL
Anion gap: 7 (ref 5–15)
BUN: 16 mg/dL (ref 6–20)
CO2: 25 mmol/L (ref 22–32)
Calcium: 8.7 mg/dL — ABNORMAL LOW (ref 8.9–10.3)
Chloride: 107 mmol/L (ref 101–111)
Creatinine, Ser: 1.18 mg/dL (ref 0.61–1.24)
GFR calc Af Amer: >60 mL/min (ref >60)
GFR calc non Af Amer: >60 mL/min (ref >60)
Glucose, Bld: 108 mg/dL — ABNORMAL HIGH (ref 65–99)
Potassium: 3.9 mmol/L (ref 3.5–5.1)
Sodium: 139 mmol/L (ref 135–145)

## 2016-05-11 LAB — CBC WITH DIFFERENTIAL/PLATELET
Basophils Absolute: 0 10*3/uL (ref 0.0–0.1)
Basophils Relative: 1 %
Eosinophils Absolute: 0.3 10*3/uL (ref 0.0–0.7)
Eosinophils Relative: 5 %
HCT: 43.1 % (ref 39.0–52.0)
Hemoglobin: 14.4 g/dL (ref 13.0–17.0)
Lymphocytes Relative: 37 %
Lymphs Abs: 2.2 10*3/uL (ref 0.7–4.0)
MCH: 32.2 pg (ref 26.0–34.0)
MCHC: 33.4 g/dL (ref 30.0–36.0)
MCV: 96.4 fL (ref 78.0–100.0)
Monocytes Absolute: 0.6 10*3/uL (ref 0.1–1.0)
Monocytes Relative: 10 %
Neutro Abs: 2.8 10*3/uL (ref 1.7–7.7)
Neutrophils Relative %: 47 %
Platelets: 268 10*3/uL (ref 150–400)
RBC: 4.47 MIL/uL (ref 4.22–5.81)
RDW: 13.1 % (ref 11.5–15.5)
WBC: 5.9 10*3/uL (ref 4.0–10.5)

## 2016-05-11 LAB — PROTIME-INR
INR: 2.07
Prothrombin Time: 23.6 seconds — ABNORMAL HIGH (ref 11.4–15.2)

## 2016-05-11 MED ORDER — AMIODARONE HCL 200 MG PO TABS: 400.0000 mg | ORAL_TABLET | Freq: Every day | ORAL | 0 refills | Status: DC

## 2016-05-11 NOTE — ED Triage Notes (Signed)
Pt states he had some sob, states "I feel like I can't get a good breath in" pt states he has had a cough earlier this week, but none today.

## 2016-05-11 NOTE — Telephone Encounter (Signed)
Called by Dr. Anitra Lauth in the [facility] ER. Ruben [name] presented with recurrent a-fib. He has been therapeutic on warfarin. She discussed cardioversion with him, however, he was not interested at this time. According to Dr. [name] note, he was just weaned down to 100 mg TID. I advised him to take an additional 300 mg today and then stay on 400 mg daily until he can see Dr. [name]. Apparently, this has happened in the past when he was weaned down on amiodarone. If he does not chemically convert with this dose increase, he may need to be rescheduled for cardioversion. I advised the patient that the office would reach out to him for a follow-up appointment.  Ruben Nose, MD, [facility] Attending Cardiologist [facility]

## 2016-05-11 NOTE — ED Provider Notes (Addendum)
[facility] Provider Note   CSN: [id number] Arrival date & time: 05/11/16  1706  By signing my name below, I, Ruben [name], attest that this documentation has been prepared under the direction and in the presence of Gwyneth Sprout, MD. Electronically signed, Ruben [name], ED Scribe. 05/11/16. 6:21 PM.  History   Chief Complaint Chief Complaint  Patient presents with  . sob    HPI .HPI Comments: Ruben [name] is a 66 y.o. male, with Hx of A-fib, who presents to the Emergency Department complaining of worsening shortness of breath that started yesterday. Pt started noticing that he was becoming short of breath when exerting himself yesterday. Pt also notes that he has checked his fitbit and states that his heart rate has been higher than normal. Currently it is ~80 and is normally in 50's to 60's. On 05/05/2016, he was prescribed amiodarone 100mg  taken MWF, he was then changed to half of a tablet. Yesterday pt started feeling short of breath. He is also currently taking Coumadin, last INR 5 weeks ago. He has been cardioverted twice in the past. No recent leg swelling. No chest pain.  The history is provided by the patient. No language interpreter was used.    Past Medical History:  Diagnosis Date  . Atrial fibrillation (HCC)   . Cough   . Non-ischemic cardiomyopathy (HCC)       . OSA (obstructive sleep apnea)     Patient Active Problem List   Diagnosis Date Noted  . Chronic systolic CHF (congestive heart failure) (HCC) 11/07/2015  . Cyst of eyelid 03/22/2014  . Discoloration of nail 03/22/2014  . Detachment of nail 03/22/2014  . Encounter for therapeutic drug monitoring 05/23/2013  . HLD (hyperlipidemia) 05/27/2012  . Malaise and fatigue 03/30/2012  . Pure hypercholesterolemia 11/17/2010  . OSA (obstructive sleep apnea) 09/04/2010  . CARDIOMYOPATHY 06/05/2010  . Atrial fibrillation (HCC) 06/05/2010    Past Surgical History:  Procedure Laterality Date  .  CARDIOVERSION N/A 03/02/2013   Procedure: CARDIOVERSION;  Surgeon: [name], MD;  Location: [facility] ENDOSCOPY;  Service: Cardiovascular;  Laterality: N/A;  . FOOT FRACTURE SURGERY    . PILONIDAL CYST EXCISION         Home Medications    Prior to Admission medications   Medication Sig Start Date End Date Taking? Authorizing Provider  amiodarone (PACERONE) 100 MG tablet Take 1 tablet (100 mg total) by mouth 3 (three) times a week. On Mondays, Wednesdays, and Fridays 05/06/16   Vesta Mixer, MD  carvedilol (COREG) 6.25 MG tablet Take 0.5 tablets (3.125 mg total) by mouth 2 (two) times daily. 09/10/15   Vesta Mixer, MD  hydrALAZINE (APRESOLINE) 25 MG tablet TAKE ONE-HALF TABLET BY MOUTH THREE TIMES DAILY 04/21/16   Vesta Mixer, MD  lisinopril (PRINIVIL,ZESTRIL) 5 MG tablet TAKE ONE TABLET BY MOUTH ONCE DAILY 04/21/16   Vesta Mixer, MD  pravastatin (PRAVACHOL) 20 MG tablet Take 20 mg by mouth daily.    Historical Provider, MD  warfarin (COUMADIN) 2 MG tablet TAKE AS DIRECTED BY  COUMADIN  CLINIC 02/10/16   Vesta Mixer, MD    Family History Family History  Problem Relation Age of Onset  . Cancer Mother   . Stroke Mother   . Hypertension Mother   . Memory loss Mother   . Prostate cancer Father     Social History Social History  Substance Use Topics  . Smoking status: Never Smoker  . Smokeless tobacco: Never Used  .  Alcohol use No     Allergies   Patient has no known allergies.   Review of Systems Review of Systems  Constitutional: Negative for fever.  Respiratory: Positive for shortness of breath.   Cardiovascular: Negative for chest pain and leg swelling.  All other systems reviewed and are negative.   Physical Exam Updated Vital Signs BP 117/82   Pulse 76   Temp 97.8 F (36.6 C) (Oral)   Resp 18   Ht 6\' 4"  (1.93 m)   Wt 268 lb (121.6 kg)   SpO2 98%   BMI 32.62 kg/m   Physical Exam  Constitutional: He is oriented to person, place, and time.  He appears well-developed and well-nourished.  HENT:  Head: Normocephalic and atraumatic.  Eyes: Conjunctivae are normal.  Neck: Neck supple.  Cardiovascular: Normal rate.   Irregularly irregular HR. 1+ pitting edema bilaterally to the mid shin.  Pulmonary/Chest: Effort normal and breath sounds normal.  Abdominal: Soft. Bowel sounds are normal.  Musculoskeletal: Normal range of motion.  Neurological: He is alert and oriented to person, place, and time.  Skin: Skin is warm and dry.  Psychiatric: He has a normal mood and affect. His behavior is normal.  Nursing note and vitals reviewed.    ED Treatments / Results  DIAGNOSTIC STUDIES: Oxygen Saturation is 98% on RA, normal by my interpretation.  COORDINATION OF CARE: 6:22 PM-Discussed treatment plan with pt at bedside and pt agreed to plan.   Labs (all labs ordered are listed, but only abnormal results are displayed) Labs Reviewed  BASIC METABOLIC PANEL - Abnormal; Notable for the following:       Result Value   Glucose, Bld 108 (*)    Calcium 8.7 (*)    All other components within normal limits  PROTIME-INR - Abnormal; Notable for the following:    Prothrombin Time 23.6 (*)    All other components within normal limits  CBC WITH DIFFERENTIAL/PLATELET    EKG  EKG Interpretation  Date/Time:  Monday May 11 2016 17:29:23 EST Ventricular Rate:  86 PR Interval:    QRS Duration: 106 QT Interval:  392 QTC Calculation: 469 R Axis:   -6 Text Interpretation:  Atrial fibrillation with a competing junctional pacemaker Incomplete left bundle branch block Nonspecific ST abnormality No significant change since last tracing Confirmed by Anitra Lauth  MD, Alphonzo Lemmings ([id number]) on 05/11/2016 5:42:10 PM       Radiology No results found.  Procedures Procedures (including critical care time)  Medications Ordered in ED Medications - No data to display   Initial Impression / Assessment and Plan / ED Course  I have reviewed the triage  vital signs and the nursing notes.  Pertinent labs & imaging results that were available during my care of the patient were reviewed by me and considered in my medical decision making (see chart for details).    Patient is a 66 year old male with a history of paroxysmal atrial fibrillation controlled on amiodarone presenting today with feeling like he's gone back into A. fib in the last 2 days. He will occasionally become short of breath with exertion but denies any chest pain, peripheral edema or signs of CHF. Patient notes that last week his amiodarone was weaned to 3 days a week. But he has not had any other changes. He has not missed any of his medication.  EKG shows A. fib today. Otherwise labs within normal limits. Discussed with cardiology. Gave patient the option of cardioversion versus going back on his prior  dose of amiodarone. Patient does not wish to be cardioverted today. We'll have him follow-up with Dr. [name] later this week 7:28 PM Because patient does not wish to be cardioverted today. The cardiologist instructed to start patient on amiodarone 400 mg to load him and continue that until follow-up with his cardiologist. CHA2DS2/VAS Stroke Risk Points      1 >= 2 Points: High Risk  1 - 1.99 Points: Medium Risk  0 Points: Low Risk    The patient's score has not changed in the past year.:  No Change         Details    Note: External data might be a factor in metrics not marked with    Points Metrics   This score determines the patient's risk of having a stroke if the  patient has atrial fibrillation.       1 Has Congestive Heart Failure:  Yes   0 Has Vascular Disease:  No   0 Has Hypertension:  No   0 Age:  42   0 Has Diabetes:  No   0 Had Stroke:  No Had TIA:  No Had thromboembolism:  No   0 Male:  No          Final Clinical Impressions(s) / ED Diagnoses   Final diagnoses:  Paroxysmal atrial fibrillation (HCC)    New Prescriptions New Prescriptions    AMIODARONE (PACERONE) 200 MG TABLET    Take 2 tablets (400 mg total) by mouth daily. Until told to go back down by your cardiologist   I personally performed the services described in this documentation, which was scribed in my presence.  The recorded information has been reviewed and considered.     Gwyneth Sprout, MD 05/11/16 1928    Gwyneth Sprout, MD 05/14/16 2242

## 2016-05-11 NOTE — Discharge Instructions (Signed)
Start taking your amiodarone 400 mg daily. Continue that dose until instructed by her cardiologist to change. Take 300 more milligrams tonight before bed and then start the 400 mg daily tomorrow.

## 2016-05-12 ENCOUNTER — Telehealth: Payer: Self-pay | Admitting: Cardiovascular Disease

## 2016-05-12 NOTE — Telephone Encounter (Signed)
New Message      Went into AFIB Sunday and went to er, they put him on higher dosage 400 1x day  amiodarone (PACERONE) 200 MG tablet Take 2 tablets (400 mg total) by mouth daily. Until told to go back down by your cardiologist   I tried to get him appt with Tereso Newcomer on Friday and he said he wants to hold off til he talks to you

## 2016-05-12 NOTE — Telephone Encounter (Signed)
Spoke with patient who states he is returning the call about his amiodarone dose. Dr. [name] decreased his dose to 100 mg 3 times per week on 05/05/16 and he went back into a fib yesterday.  He states he is not opposed to cardioversion, but did not want to schedule it yesterday. I offered him the option of having cardioversion on Friday with Dr. [name].  He states he is unavailable on Friday. We discussed treatment options and I offered him the option of seeing [name], NP in Afib clinic. He states he is not especially bothered by symptoms; states he feels a little fatigued today but was able to travel by car for business without difficulty. States he notes a little SOB when heart rate is elevated in atrial fib. Patient states he would like to hear Dr. [name] philosophy on managing a fib before planning next step. I advised him that I will forward to Dr. [name] for review and call him back with his advice. He verbalized understanding and agreement and thanked me for the call.

## 2016-05-12 NOTE — Telephone Encounter (Signed)
Left detailed message on patient's voice mail that I was calling to discuss reason patient is opposed to cardioversion. I advised that Dr. [name] is in the office tomorrow and Thursday but schedule is currently full. I advised he call back to discuss treatment.

## 2016-05-13 NOTE — Telephone Encounter (Signed)
Spoke with patient who states he would like to come in to discuss treatment options with Dr. [name]. I scheduled him for office visit on 1/25 at 1:30 pm. He thanked me for the call.

## 2016-05-13 NOTE — Telephone Encounter (Signed)
I would like to get him off the amio if possible ( or to as low a dose as possible) to avoid toxicity. It seems that he has gone back into atrial fibrillation when weaker down on the dose of amiodarone. I would like to consider sending him to the A. fib clinic for consideration for a different antiarrhythmic.  Since he has had continued challenges in staying in sinus rhythm we may adopt the rate control and anticoagulation strategy instead of rhythm control.   I have a lot of patience we treated this way and this is especially effective when patients are asymptomatic with their atrial fib.   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

## 2016-05-14 ENCOUNTER — Encounter: Payer: Self-pay | Admitting: Cardiovascular Disease

## 2016-05-14 ENCOUNTER — Ambulatory Visit (INDEPENDENT_AMBULATORY_CARE_PROVIDER_SITE_OTHER): Payer: BC Managed Care – PPO | Admitting: Cardiovascular Disease

## 2016-05-14 ENCOUNTER — Encounter (INDEPENDENT_AMBULATORY_CARE_PROVIDER_SITE_OTHER): Payer: Self-pay

## 2016-05-14 VITALS — BP 104/80 | HR 80 | Ht 76.0 in | Wt 268.8 lb

## 2016-05-14 DIAGNOSIS — I480 Paroxysmal atrial fibrillation: Secondary | ICD-10-CM | POA: Diagnosis not present

## 2016-05-14 MED ORDER — CARVEDILOL 12.5 MG PO TABS
12.5000 mg | ORAL_TABLET | Freq: Two times a day (BID) | ORAL | 3 refills | Status: DC
Start: 2016-05-14 — End: 2016-05-22

## 2016-05-14 NOTE — Progress Notes (Signed)
Cardiology Office Note   Date:  05/14/2016   ID:  MOMIN MISKO, DOB 03-27-1951, MRN [id number]  PCP:  Aura Dials, MD  Cardiologist:  previous [name] MD,  [name], MD   Chief Complaint  Patient presents with  . Atrial Fibrillation   Problem list 1. 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  Atrial fibrillation 3. Obesity 4. Hyperlipidemia 5. Obstructive sleep apnea 6. Mitral valve prolapse - mild MR by echo Sept. 2014    [name] - notes from Ozan Maclay is a 66 y.o. male who presents for Six-month follow-up visit    He has a past history of nonischemic dilated cardiomyopathy and associated atrial fibrillation at that time. He was hospitalized in February 2012 and was critically ill with severe systolic left ventricular dysfunction and atrial fibrillation. He was successfully cardioverted back to sinus rhythm in May 2012. He did well until August 2014 when he went back into atrial fibrillation after we had cut back his amiodarone to just 100 mg daily. His dose was increased and we cardioverted him successfully on 03/02/13. Since then he has maintained normal sinus rhythm. His echocardiogram in 01/14/13 showed that his ejection fraction had improved to 50-55%. Since last visit has had no new cardiac symptoms. He has a history of sleep apnea and uses a CPAP machine successfully.  The patient has a history of hypercholesterolemia. He is not experiencing any myalgias. Recent lab work includes normal hepatic function and normal thyroid function  He has had a slight cough which is resolving on Mucinex.He has gained weight.  His family gave him a fit.bit  And he is trying to get more exercise to help him lose weight.  November 07, 2015:  Hx of a viral cardiomyopathy in 2011. Never got over a cold - progressive shortness of breath.  EF was 10% at one point  EF h [name] is doing well.  Seen for the  first time today - transfer from [place]  Works for the state - [employer] .   No regular exercise .    No Cp ,  Breathing is good.  Jan. 16, 2018  Doing well. Has had a cold recently. Has not been exercising .   Jan.  25th, 2018:  [name] is seen back today for a work in visit.  During his last visit we decreased his dose of amiodarone. He has gone back into atrial fibrillation.  He has been active,   This past Sunday , he noticed that he was more short of breath,  More fatigued.   Was seen in the hospital - found to be in atrial fib  He notices a faster HR on his Fit Bit.   Has not missed any doses of anticoagulant .    Has not tried any other antiarrhythmics.    Past Medical History:  Diagnosis Date  . Atrial fibrillation (HCC)   . Cough   . Non-ischemic cardiomyopathy (HCC)       . OSA (obstructive sleep apnea)     Past Surgical History:  Procedure Laterality Date  . CARDIOVERSION N/A 03/02/2013   Procedure: CARDIOVERSION;  Surgeon: [name], MD;  Location: [facility] ENDOSCOPY;  Service: Cardiovascular;  Laterality: N/A;  . FOOT FRACTURE SURGERY    . PILONIDAL CYST EXCISION       Current Outpatient Prescriptions  Medication Sig Dispense Refill  . amiodarone (PACERONE) 200 MG tablet Take 2 tablets (400 mg  total) by mouth daily. Until told to go back down by your cardiologist 20 tablet 0  . carvedilol (COREG) 6.25 MG tablet Take 0.5 tablets (3.125 mg total) by mouth 2 (two) times daily. 90 tablet 3  . hydrALAZINE (APRESOLINE) 25 MG tablet TAKE ONE-HALF TABLET BY MOUTH THREE TIMES DAILY 135 tablet 3  . lisinopril (PRINIVIL,ZESTRIL) 5 MG tablet TAKE ONE TABLET BY MOUTH ONCE DAILY 30 tablet 5  . pravastatin (PRAVACHOL) 20 MG tablet Take 20 mg by mouth daily.    Marland Kitchen warfarin (COUMADIN) 2 MG tablet TAKE AS DIRECTED BY  COUMADIN  CLINIC 170 tablet 1   No current facility-administered medications for this visit.     Allergies:   Patient has no known  allergies.    Social History:  The patient  reports that he has never smoked. He has never used smokeless tobacco. He reports that he does not drink alcohol or use drugs.   Family History:  The patient's family history includes Cancer in his mother; Hypertension in his mother; Memory loss in his mother; Prostate cancer in his father; Stroke in his mother.    ROS:  Please see the history of present illness.   Otherwise, review of systems are positive for none.   All other systems are reviewed and negative.    PHYSICAL EXAM: VS:  BP 104/80 (BP Location: Left Arm, Patient Position: Sitting, Cuff Size: Normal)   Pulse 80   Ht 6\' 4"  (1.93 m)   Wt 268 lb 12.8 oz (121.9 kg)   SpO2 98%   BMI 32.72 kg/m  , BMI Body mass index is 32.72 kg/m. GEN: Well nourished, well developed, in no acute distress  HEENT: normal  Neck: no JVD, carotid bruits, or masses Cardiac: Irreg. Irreg.  1/6 systolic murmur, rubs, or gallops,no edema  Respiratory:  clear to auscultation bilaterally, normal work of breathing GI: soft, nontender, nondistended, + BS MS: no deformity or atrophy  Skin: warm and dry, no rash Neuro:  Strength and sensation are intact Psych: euthymic mood, full affect  EKG:  EKG is ordered today. The ekg ordered today atrial fib fib with a heart rate of 80. He has occasional aberrant conduction.  Recent Labs: 04/28/2016: ALT 18; TSH 2.560 05/11/2016: BUN 16; Creatinine, Ser 1.18; Hemoglobin 14.4; Platelets 268; Potassium 3.9; Sodium 139    Lipid Panel    Component Value Date/Time   CHOL 175 04/28/2016 1003   TRIG 154 (H) 04/28/2016 1003   HDL 43 04/28/2016 1003   CHOLHDL 4.1 04/28/2016 1003   CHOLHDL 3.4 11/05/2015 0746   VLDL 31 (H) 11/05/2015 0746   LDLCALC 101 (H) 04/28/2016 1003   LDLDIRECT 90.2 12/30/2012 0740      Wt Readings from Last 3 Encounters:  05/14/16 268 lb 12.8 oz (121.9 kg)  05/11/16 268 lb (121.6 kg)  05/05/16 267 lb 12.8 oz (121.5 kg)      ASSESSMENT  AND PLAN:  1.  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 has been stable    2. paroxysmal atrial fibrillation,  Since we decreased his amiodarone dose to 100 mg 3 times a week he is gone back into atrial fibrillation. I do not think that he needs to be on amiodarone long-term. He is 66 years old. I think I would favor rate control and anticoagulation instead of lifelong amiodarone therapy. It's possible that he may be a candidate for flecainide or sotalol. I do not think  that he is a candidate for Tikosyn yet because of his 6 years of amiodarone therapy.  We'll send him to the atrial fibrillation clinic for further advice. He may be a candidate for ablation area I'll see him in 3 months.  3. Dyslipidemia.We will continue pravastatin.  He is not having any myalgias.  Lipids look good from last week.  Recheck in 6 months .   4.   Mitral valve prolapse:   Mild MR.    [name], MD  05/14/2016 1:53 PM    [facility] [address] [place],  [address] [place], [place]  [address] Pager [phone] Phone: [phone]; Fax: [phone]

## 2016-05-14 NOTE — Patient Instructions (Signed)
Medication Instructions:  INCREASE Carvedilol (Coreg) to 12.5 mg twice daily STOP Amiodarone   Labwork: None Ordered   Testing/Procedures: None Ordered   Follow-Up: Your physician recommends that you schedule an appointment with [facility]  Your physician recommends that you schedule a follow-up appointment in: 3 months with Dr. [name]   If you need a refill on your cardiac medications before your next appointment, please call your pharmacy.   Thank you for choosing [facility]! Eligha Bridegroom, RN [phone]

## 2016-05-15 ENCOUNTER — Ambulatory Visit: Payer: BC Managed Care – PPO | Admitting: Physician Assistant

## 2016-05-15 ENCOUNTER — Telehealth: Payer: Self-pay | Admitting: Interventional Cardiology

## 2016-05-15 NOTE — Telephone Encounter (Signed)
New Message:     Please call, question about his Carvedilol. 

## 2016-05-15 NOTE — Telephone Encounter (Signed)
Spoke with patient who called to verify carvedilol dose. He states he could not remember if he was supposed to take 25 mg in a day or 25 mg twice daily.  I verified that per Dr. [name], his prescribed dose is 12.5 mg twice daily. I advised him to call back after a week or so if he is experiencing dizziness, light-headedness, and/or fatigue. He verbalized understanding and agreement and thanked me for the call.

## 2016-05-15 NOTE — Telephone Encounter (Signed)
**Note De-Identified Suhani Stillion Obfuscation** This is Dr [name] pt. Will forward message to his nurse Marcelino Duster.

## 2016-05-18 ENCOUNTER — Telehealth: Payer: Self-pay | Admitting: Pharmacist

## 2016-05-18 NOTE — Telephone Encounter (Signed)
Received fax from Dr Beather Arbour and Dr Elmer Sow office in [facility] with request for pt to hold Coumadin prior to 2 dental extractions. Pt takes Coumadin for afib with CHADS2 score of 1 (HF). Ok to hold for 1-2 days as needed. Clearance faxed to office [phone].

## 2016-05-19 ENCOUNTER — Ambulatory Visit (INDEPENDENT_AMBULATORY_CARE_PROVIDER_SITE_OTHER): Payer: BC Managed Care – PPO | Admitting: *Deleted

## 2016-05-19 DIAGNOSIS — Z5181 Encounter for therapeutic drug level monitoring: Secondary | ICD-10-CM

## 2016-05-19 DIAGNOSIS — I4891 Unspecified atrial fibrillation: Secondary | ICD-10-CM | POA: Diagnosis not present

## 2016-05-19 LAB — POCT INR: INR: 3.2

## 2016-05-22 ENCOUNTER — Ambulatory Visit (HOSPITAL_COMMUNITY)
Admission: RE | Admit: 2016-05-22 | Discharge: 2016-05-22 | Disposition: A | Discharge status: Home / Self Care | Payer: BC Managed Care – PPO | Source: Ambulatory Visit | Attending: Nurse Practitioner | Admitting: Nurse Practitioner

## 2016-05-22 ENCOUNTER — Encounter (HOSPITAL_COMMUNITY): Payer: Self-pay | Admitting: Nurse Practitioner

## 2016-05-22 VITALS — BP 116/74 | HR 89 | Ht 76.0 in | Wt 269.0 lb

## 2016-05-22 DIAGNOSIS — Z823 Family history of stroke: Secondary | ICD-10-CM | POA: Insufficient documentation

## 2016-05-22 DIAGNOSIS — I429 Cardiomyopathy, unspecified: Secondary | ICD-10-CM | POA: Diagnosis not present

## 2016-05-22 DIAGNOSIS — Z9889 Other specified postprocedural states: Secondary | ICD-10-CM | POA: Insufficient documentation

## 2016-05-22 DIAGNOSIS — Z79899 Other long term (current) drug therapy: Secondary | ICD-10-CM | POA: Insufficient documentation

## 2016-05-22 DIAGNOSIS — I481 Persistent atrial fibrillation: Secondary | ICD-10-CM

## 2016-05-22 DIAGNOSIS — Z8249 Family history of ischemic heart disease and other diseases of the circulatory system: Secondary | ICD-10-CM | POA: Insufficient documentation

## 2016-05-22 DIAGNOSIS — I4891 Unspecified atrial fibrillation: Secondary | ICD-10-CM | POA: Diagnosis present

## 2016-05-22 DIAGNOSIS — G4733 Obstructive sleep apnea (adult) (pediatric): Secondary | ICD-10-CM | POA: Diagnosis not present

## 2016-05-22 DIAGNOSIS — Z809 Family history of malignant neoplasm, unspecified: Secondary | ICD-10-CM | POA: Insufficient documentation

## 2016-05-22 DIAGNOSIS — R634 Abnormal weight loss: Secondary | ICD-10-CM | POA: Diagnosis not present

## 2016-05-22 DIAGNOSIS — Z7901 Long term (current) use of anticoagulants: Secondary | ICD-10-CM | POA: Insufficient documentation

## 2016-05-22 DIAGNOSIS — I4819 Other persistent atrial fibrillation: Secondary | ICD-10-CM

## 2016-05-22 MED ORDER — CARVEDILOL 12.5 MG PO TABS: ORAL_TABLET | ORAL | 3 refills | Status: DC

## 2016-05-22 NOTE — Progress Notes (Signed)
Primary Care Physician: Aura Dials, MD Referring Physician: Dr. Craige Cotta MIT Ruben [name] is a 66 y.o. male with a h/o NICM and afib. He was hospitalized in February 2012 and was critically ill with severe systolic left ventricular dysfunction and atrial fibrillation. He was successfully cardioverted back to sinus rhythm in May 2012. He did well until August 2014 when he went back into atrial fibrillation after we had cut back his amiodarone to just 100 mg daily. His dose was increased and we cardioverted him successfully on 03/02/13. Since then he has maintained normal sinus rhythm. His echocardiogram in 01/14/13 showed that his ejection fraction had improved to 50-55%.  Due to concerns of long term complications of amiodarone, the dose of drug has been recently decreased, but with subsequent return of atrial fibrillation. He is in the afib clinic today to discuss options. He is fairly comfortable in afib, and is rate controlled at rest, but does notice that his heart rate will increase to 120 bpm with exertion.  He does not drink alcohol, no excessive caffeine, no tobacco. He is obese, does not exercise and admits that he eats a lot of junk food. He does have OSA and uses CPAP.  Today, he denies symptoms of  chest pain, orthopnea, PND, lower extremity edema, dizziness, presyncope, syncope, or neurologic sequela. Positive for exertional dyspnea. The patient is tolerating medications without difficulties and is otherwise without complaint today.   Past Medical History:  Diagnosis Date  . Atrial fibrillation (HCC)   . Cough   . Non-ischemic cardiomyopathy (HCC)       . OSA (obstructive sleep apnea)    Past Surgical History:  Procedure Laterality Date  . CARDIOVERSION N/A 03/02/2013   Procedure: CARDIOVERSION;  Surgeon: [name], MD;  Location: [facility] ENDOSCOPY;  Service: Cardiovascular;  Laterality: N/A;  . FOOT FRACTURE SURGERY    . PILONIDAL CYST EXCISION      Current  Outpatient Prescriptions  Medication Sig Dispense Refill  . carvedilol (COREG) 12.5 MG tablet Take 2 tablets (25mg ) in the morning and 1 tablet (12.5mg ) in the evening. 90 tablet 3  . hydrALAZINE (APRESOLINE) 25 MG tablet TAKE ONE-HALF TABLET BY MOUTH THREE TIMES DAILY 135 tablet 3  . lisinopril (PRINIVIL,ZESTRIL) 5 MG tablet TAKE ONE TABLET BY MOUTH ONCE DAILY 30 tablet 5  . pravastatin (PRAVACHOL) 20 MG tablet Take 20 mg by mouth daily.    Marland Kitchen warfarin (COUMADIN) 2 MG tablet TAKE AS DIRECTED BY  COUMADIN  CLINIC 170 tablet 1   No current facility-administered medications for this encounter.     No Known Allergies  Social History   Social History  . Marital status: Married    Spouse name: N/A  . Number of children: N/A  . Years of education: N/A   Occupational History  . Not on file.   Social History Main Topics  . Smoking status: Never Smoker  . Smokeless tobacco: Never Used  . Alcohol use No  . Drug use: No  . Sexual activity: Not on file   Other Topics Concern  . Not on file   Social History Narrative  . No narrative on file    Family History  Problem Relation Age of Onset  . Cancer Mother   . Stroke Mother   . Hypertension Mother   . Memory loss Mother   . Prostate cancer Father     ROS- All systems are reviewed and negative except as per the HPI above  Physical Exam:  Vitals:   05/22/16 0828  BP: 116/74  Pulse: 89  Weight: 269 lb (122 kg)  Height: 6\' 4"  (1.93 m)   Wt Readings from Last 3 Encounters:  05/22/16 269 lb (122 kg)  05/14/16 268 lb 12.8 oz (121.9 kg)  05/11/16 268 lb (121.6 kg)    Labs: Lab Results  Component Value Date   NA 139 05/11/2016   K 3.9 05/11/2016   CL 107 05/11/2016   CO2 25 05/11/2016   GLUCOSE 108 (H) 05/11/2016   BUN 16 05/11/2016   CREATININE 1.18 05/11/2016   CALCIUM 8.7 (L) 05/11/2016   PHOS 3.9 05/09/2010   MG 1.8 07/17/2010   Lab Results  Component Value Date   INR 3.2 05/19/2016   Lab Results    Component Value Date   CHOL 175 04/28/2016   HDL 43 04/28/2016   LDLCALC 101 (H) 04/28/2016   TRIG 154 (H) 04/28/2016     GEN- The patient is well appearing, alert and oriented x 3 today.   Head- normocephalic, atraumatic Eyes-  Sclera clear, conjunctiva pink Ears- hearing intact Oropharynx- clear Neck- supple, no JVP Lymph- no cervical lymphadenopathy Lungs- Clear to ausculation bilaterally, normal work of breathing Heart- irregular rate and rhythm, no murmurs, rubs or gallops, PMI not laterally displaced GI- soft, NT, ND, + BS Extremities- no clubbing, cyanosis, or edema MS- no significant deformity or atrophy Skin- no rash or lesion Psych- euthymic mood, full affect Neuro- strength and sensation are intact  EKG- afib at 89 bpm, LAD, ILBBB, qtc at 462 ms Echo-2014-Study Conclusions  - Left ventricle: The cavity size was normal. There was mild focal basal hypertrophy of the septum. Systolic function was normal. The estimated ejection fraction was in the range of 50% to 55%. Wall motion was normal; there were no regional wall motion abnormalities. - Ascending aorta: The ascending aorta was mildly dilated. - Mitral valve: Mild prolapse, involving the anterior leaflet and the posterior leaflet. Mild regurgitation. - Left atrium: The atrium was mildly dilated. - Right atrium: The atrium was mildly dilated.     Assessment and Plan: 1. Afib Has been on amiodarone for six years and is now off drug for concern of long term side effects and has returned to afib Options discussed going forward including cardioversion, rate control, ablation or tikosyn  He would like to let amiodarone wash out and then try tikosyn Will increase coreg to 25 mg in am and continue with 12.5 mg pm for now Continue warfarin He wlll watch his BP and HR's at home and notify the office if his numbers are not satisfactory  2. Lifestyle issues Encouraged healthier diet for weight  loss Continue to use CPAP Regular exercise encouraged  2. H/o NICM Update echo  Return in one month for amiodarone level and timing of tikosyn admission, will need weekly therapeutic INR's  prior to being admitted   Lupita Leash C. [facility] Fol[facility] [address] [place], [place] [address] [phone]

## 2016-05-22 NOTE — Patient Instructions (Signed)
Your physician has recommended you make the following change in your medication:  1)Increase coreg to 25mg  (2 tablets) in the morning and 12.5mg  (1 tablet) in the evening.

## 2016-05-28 ENCOUNTER — Other Ambulatory Visit (HOSPITAL_COMMUNITY): Payer: BC Managed Care – PPO

## 2016-06-01 ENCOUNTER — Ambulatory Visit (HOSPITAL_COMMUNITY)
Admission: RE | Admit: 2016-06-01 | Discharge: 2016-06-01 | Disposition: A | Discharge status: Home / Self Care | Payer: BC Managed Care – PPO | Source: Ambulatory Visit | Attending: Nurse Practitioner | Admitting: Nurse Practitioner

## 2016-06-01 DIAGNOSIS — I4819 Other persistent atrial fibrillation: Secondary | ICD-10-CM

## 2016-06-01 DIAGNOSIS — I517 Cardiomegaly: Secondary | ICD-10-CM | POA: Insufficient documentation

## 2016-06-01 DIAGNOSIS — I481 Persistent atrial fibrillation: Secondary | ICD-10-CM | POA: Diagnosis present

## 2016-06-01 DIAGNOSIS — I340 Nonrheumatic mitral (valve) insufficiency: Secondary | ICD-10-CM | POA: Diagnosis not present

## 2016-06-01 NOTE — Progress Notes (Signed)
  Echocardiogram 2D Echocardiogram has been performed.  Ruben [name] 06/01/2016, 8:58 AM

## 2016-06-02 ENCOUNTER — Ambulatory Visit (INDEPENDENT_AMBULATORY_CARE_PROVIDER_SITE_OTHER): Payer: BC Managed Care – PPO | Admitting: *Deleted

## 2016-06-02 DIAGNOSIS — I4891 Unspecified atrial fibrillation: Secondary | ICD-10-CM | POA: Diagnosis not present

## 2016-06-02 DIAGNOSIS — Z5181 Encounter for therapeutic drug level monitoring: Secondary | ICD-10-CM | POA: Diagnosis not present

## 2016-06-02 LAB — POCT INR: INR: 1.9

## 2016-06-03 ENCOUNTER — Encounter (HOSPITAL_COMMUNITY): Payer: Self-pay | Admitting: *Deleted

## 2016-06-16 ENCOUNTER — Encounter (INDEPENDENT_AMBULATORY_CARE_PROVIDER_SITE_OTHER): Payer: Self-pay

## 2016-06-16 ENCOUNTER — Ambulatory Visit (INDEPENDENT_AMBULATORY_CARE_PROVIDER_SITE_OTHER): Payer: BC Managed Care – PPO | Admitting: *Deleted

## 2016-06-16 DIAGNOSIS — Z5181 Encounter for therapeutic drug level monitoring: Secondary | ICD-10-CM | POA: Diagnosis not present

## 2016-06-16 DIAGNOSIS — I4891 Unspecified atrial fibrillation: Secondary | ICD-10-CM | POA: Diagnosis not present

## 2016-06-16 LAB — POCT INR: INR: 1.7

## 2016-06-19 ENCOUNTER — Encounter (HOSPITAL_COMMUNITY): Payer: Self-pay | Admitting: Nurse Practitioner

## 2016-06-19 ENCOUNTER — Ambulatory Visit (HOSPITAL_COMMUNITY)
Admission: RE | Admit: 2016-06-19 | Discharge: 2016-06-19 | Disposition: A | Discharge status: Home / Self Care | Payer: BC Managed Care – PPO | Source: Ambulatory Visit | Attending: Nurse Practitioner | Admitting: Nurse Practitioner

## 2016-06-19 VITALS — BP 106/68 | HR 80 | Ht 76.0 in | Wt 268.0 lb

## 2016-06-19 DIAGNOSIS — Z79899 Other long term (current) drug therapy: Secondary | ICD-10-CM | POA: Diagnosis not present

## 2016-06-19 DIAGNOSIS — G4733 Obstructive sleep apnea (adult) (pediatric): Secondary | ICD-10-CM | POA: Diagnosis not present

## 2016-06-19 DIAGNOSIS — Z823 Family history of stroke: Secondary | ICD-10-CM | POA: Insufficient documentation

## 2016-06-19 DIAGNOSIS — Z8042 Family history of malignant neoplasm of prostate: Secondary | ICD-10-CM | POA: Insufficient documentation

## 2016-06-19 DIAGNOSIS — I481 Persistent atrial fibrillation: Secondary | ICD-10-CM | POA: Diagnosis not present

## 2016-06-19 DIAGNOSIS — Z8249 Family history of ischemic heart disease and other diseases of the circulatory system: Secondary | ICD-10-CM | POA: Diagnosis not present

## 2016-06-19 DIAGNOSIS — Z9989 Dependence on other enabling machines and devices: Secondary | ICD-10-CM | POA: Insufficient documentation

## 2016-06-19 DIAGNOSIS — I4891 Unspecified atrial fibrillation: Secondary | ICD-10-CM | POA: Diagnosis present

## 2016-06-19 DIAGNOSIS — I4819 Other persistent atrial fibrillation: Secondary | ICD-10-CM

## 2016-06-19 DIAGNOSIS — Z9889 Other specified postprocedural states: Secondary | ICD-10-CM | POA: Insufficient documentation

## 2016-06-19 DIAGNOSIS — Z7901 Long term (current) use of anticoagulants: Secondary | ICD-10-CM | POA: Diagnosis not present

## 2016-06-19 DIAGNOSIS — I429 Cardiomyopathy, unspecified: Secondary | ICD-10-CM | POA: Insufficient documentation

## 2016-06-19 NOTE — Progress Notes (Signed)
Primary Care Physician: Aura Dials, MD Referring Physician: Dr. Craige Cotta Ruben [name] is a 66 y.o. male with a h/o NICM and afib. He was hospitalized in February 2012 and was critically ill with severe systolic left ventricular dysfunction and atrial fibrillation. He was successfully cardioverted back to sinus rhythm in May 2012. He did well until August 2014 when he went back into atrial fibrillation after we had cut back his amiodarone to just 100 mg daily. His dose was increased and we cardioverted him successfully on 03/02/13. Since then he has maintained normal sinus rhythm. His echocardiogram in 01/14/13 showed that his ejection fraction had improved to 50-55%.  Due to concerns of long term complications of amiodarone, the dose of drug has been recently decreased, but with subsequent return of atrial fibrillation. He is in the afib clinic today to discuss options. He is fairly comfortable in afib, and is rate controlled at rest, but does notice that his heart rate will increase to 120 bpm with exertion.  He does not drink alcohol, no excessive caffeine, no tobacco. He is obese, does not exercise and admits that he eats a lot of junk food. He does have OSA and uses CPAP.  He returns after off amiodarone x one month. He is wanted admission for tikosyn and will draw an amiodarone level today. He will also need 4 consecutive weeks of therapeutic INR's. He was sub therapeutic last week. Followed at the coumadin clinic.  He feels ok in rate controlled afib but does see that his energy is not as good.  Today, he denies symptoms of  chest pain, orthopnea, PND, lower extremity edema, dizziness, presyncope, syncope, or neurologic sequela. Positive for exertional dyspnea. The patient is tolerating medications without difficulties and is otherwise without complaint today.   Past Medical History:  Diagnosis Date  . Atrial fibrillation (HCC)   . Cough   . Non-ischemic cardiomyopathy (HCC)        . OSA (obstructive sleep apnea)    Past Surgical History:  Procedure Laterality Date  . CARDIOVERSION N/A 03/02/2013   Procedure: CARDIOVERSION;  Surgeon: [name], MD;  Location: [facility] ENDOSCOPY;  Service: Cardiovascular;  Laterality: N/A;  . FOOT FRACTURE SURGERY    . PILONIDAL CYST EXCISION      Current Outpatient Prescriptions  Medication Sig Dispense Refill  . carvedilol (COREG) 12.5 MG tablet Take 2 tablets (25mg ) in the morning and 1 tablet (12.5mg ) in the evening. 90 tablet 3  . hydrALAZINE (APRESOLINE) 25 MG tablet TAKE ONE-HALF TABLET BY MOUTH THREE TIMES DAILY 135 tablet 3  . lisinopril (PRINIVIL,ZESTRIL) 5 MG tablet TAKE ONE TABLET BY MOUTH ONCE DAILY 30 tablet 5  . pravastatin (PRAVACHOL) 20 MG tablet Take 20 mg by mouth daily.    Marland Kitchen warfarin (COUMADIN) 2 MG tablet TAKE AS DIRECTED BY  COUMADIN  CLINIC 170 tablet 1   No current facility-administered medications for this encounter.     No Known Allergies  Social History   Social History  . Marital status: Married    Spouse name: N/A  . Number of children: N/A  . Years of education: N/A   Occupational History  . Not on file.   Social History Main Topics  . Smoking status: Never Smoker  . Smokeless tobacco: Never Used  . Alcohol use No  . Drug use: No  . Sexual activity: Not on file   Other Topics Concern  . Not on file   Social History Narrative  .  No narrative on file    Family History  Problem Relation Age of Onset  . Cancer Mother   . Stroke Mother   . Hypertension Mother   . Memory loss Mother   . Prostate cancer Father     ROS- All systems are reviewed and negative except as per the HPI above  Physical Exam: Vitals:   06/19/16 0828  BP: 106/68  Pulse: 80  Weight: 268 lb (121.6 kg)  Height: 6\' 4"  (1.93 m)   Wt Readings from Last 3 Encounters:  06/19/16 268 lb (121.6 kg)  05/22/16 269 lb (122 kg)  05/14/16 268 lb 12.8 oz (121.9 kg)    Labs: Lab Results  Component  Value Date   NA 139 05/11/2016   K 3.9 05/11/2016   CL 107 05/11/2016   CO2 25 05/11/2016   GLUCOSE 108 (H) 05/11/2016   BUN 16 05/11/2016   CREATININE 1.18 05/11/2016   CALCIUM 8.7 (L) 05/11/2016   PHOS 3.9 05/09/2010   MG 1.8 07/17/2010   Lab Results  Component Value Date   INR 1.7 06/16/2016   Lab Results  Component Value Date   CHOL 175 04/28/2016   HDL 43 04/28/2016   LDLCALC 101 (H) 04/28/2016   TRIG 154 (H) 04/28/2016     GEN- The patient is well appearing, alert and oriented x 3 today.   Head- normocephalic, atraumatic Eyes-  Sclera clear, conjunctiva pink Ears- hearing intact Oropharynx- clear Neck- supple, no JVP Lymph- no cervical lymphadenopathy Lungs- Clear to ausculation bilaterally, normal work of breathing Heart- irregular rate and rhythm, no murmurs, rubs or gallops, PMI not laterally displaced GI- soft, NT, ND, + BS Extremities- no clubbing, cyanosis, or edema MS- no significant deformity or atrophy Skin- no rash or lesion Psych- euthymic mood, full affect Neuro- strength and sensation are intact  EKG- afib at 89 bpm, LAD, ILBBB, qtc at 462 ms Echo-2014-Study Conclusions  - Left ventricle: The cavity size was normal. There was mild focal basal hypertrophy of the septum. Systolic function was normal. The estimated ejection fraction was in the range of 50% to 55%. Wall motion was normal; there were no regional wall motion abnormalities. - Ascending aorta: The ascending aorta was mildly dilated. - Mitral valve: Mild prolapse, involving the anterior leaflet and the posterior leaflet. Mild regurgitation. - Left atrium: The atrium was mildly dilated. - Right atrium: The atrium was mildly dilated. INR- 1.7, 2/27     Assessment and Plan: 1. Afib Has been on amiodarone for six years and is now off drug for concern of long term side effects and has returned to afib Options discussed going forward including cardioversion, rate control,  ablation or tikosyn  He would like to let amiodarone wash out and then try tikosyn Amiodarone level drawn today, off now x one month Continue coreg to 25 mg in am and continue with 12.5 mg pm for now Continue warfarin Will let coumadin clinic know that pt is pending hospitalization for tikosyn  in next 4-6 weeks after 4 weekly therapeutic  INR's. He is out of town next week and has an appointment pending for March 13th He wlll watch his BP and HR's at home and notify the office if his numbers are not satisfactory  2. Lifestyle issues Encouraged healthier diet for weight loss Continue to use CPAP Regular exercise encouraged  2. H/o NICM Update echo  Return after INR's have been therapeutic for necessary period of time.  Elvina Sidle [facility]  [facility] [address] [place], [place] [address] [phone]

## 2016-06-22 LAB — AMIODARONE LEVEL
Amiodarone Lvl: 0.5 ug/mL — ABNORMAL LOW (ref 1.0–2.5)
N-Desethyl-Amiodarone: 0.3 ug/mL — ABNORMAL LOW (ref 1.0–2.5)

## 2016-06-23 ENCOUNTER — Telehealth: Payer: Self-pay | Admitting: Pharmacist

## 2016-06-23 NOTE — Telephone Encounter (Signed)
Pt elected to start Tikosyn after last office visit with [name]. Pt does not take any QTc prolonging or contraindicating medications with Tikosyn. He will need 4 weekly therapeutic INRs prior to Tikosyn start and was subtherapeutic at his last check. Next INR scheduled 3/13 - will notify Lupita Leash when pt has had 4 weekly therapeutic INRs so that he can be scheduled for Tikosyn load inpatient.

## 2016-06-30 ENCOUNTER — Ambulatory Visit (INDEPENDENT_AMBULATORY_CARE_PROVIDER_SITE_OTHER): Payer: BC Managed Care – PPO | Admitting: *Deleted

## 2016-06-30 DIAGNOSIS — I4891 Unspecified atrial fibrillation: Secondary | ICD-10-CM | POA: Diagnosis not present

## 2016-06-30 DIAGNOSIS — Z5181 Encounter for therapeutic drug level monitoring: Secondary | ICD-10-CM

## 2016-06-30 LAB — POCT INR: INR: 3.8

## 2016-07-07 ENCOUNTER — Ambulatory Visit (INDEPENDENT_AMBULATORY_CARE_PROVIDER_SITE_OTHER): Payer: BC Managed Care – PPO | Admitting: *Deleted

## 2016-07-07 DIAGNOSIS — I4891 Unspecified atrial fibrillation: Secondary | ICD-10-CM

## 2016-07-07 DIAGNOSIS — Z5181 Encounter for therapeutic drug level monitoring: Secondary | ICD-10-CM | POA: Diagnosis not present

## 2016-07-07 LAB — POCT INR: INR: 3.3

## 2016-07-14 ENCOUNTER — Ambulatory Visit (INDEPENDENT_AMBULATORY_CARE_PROVIDER_SITE_OTHER): Payer: BC Managed Care – PPO | Admitting: *Deleted

## 2016-07-14 ENCOUNTER — Other Ambulatory Visit (HOSPITAL_COMMUNITY): Payer: Self-pay | Admitting: *Deleted

## 2016-07-14 DIAGNOSIS — I4891 Unspecified atrial fibrillation: Secondary | ICD-10-CM | POA: Diagnosis not present

## 2016-07-14 DIAGNOSIS — Z5181 Encounter for therapeutic drug level monitoring: Secondary | ICD-10-CM | POA: Diagnosis not present

## 2016-07-14 LAB — POCT INR: INR: 2.7

## 2016-07-14 MED ORDER — CARVEDILOL 12.5 MG PO TABS: ORAL_TABLET | ORAL | 3 refills | Status: DC

## 2016-07-21 ENCOUNTER — Ambulatory Visit (INDEPENDENT_AMBULATORY_CARE_PROVIDER_SITE_OTHER): Payer: BC Managed Care – PPO | Admitting: *Deleted

## 2016-07-21 ENCOUNTER — Encounter (INDEPENDENT_AMBULATORY_CARE_PROVIDER_SITE_OTHER): Payer: Self-pay

## 2016-07-21 DIAGNOSIS — Z5181 Encounter for therapeutic drug level monitoring: Secondary | ICD-10-CM

## 2016-07-21 DIAGNOSIS — I4891 Unspecified atrial fibrillation: Secondary | ICD-10-CM

## 2016-07-21 LAB — POCT INR: INR: 2.3

## 2016-07-22 LAB — AMIODARONE LEVEL
AMIODARONE LVL: 0.3 ug/mL — AB (ref 1.0–2.5)
N-DESETHYL-AMIODARONE: NOT DETECTED ug/mL (ref 1.0–2.5)

## 2016-07-24 ENCOUNTER — Encounter: Payer: Self-pay | Admitting: Cardiovascular Disease

## 2016-07-28 ENCOUNTER — Ambulatory Visit (INDEPENDENT_AMBULATORY_CARE_PROVIDER_SITE_OTHER): Payer: BC Managed Care – PPO | Admitting: *Deleted

## 2016-07-28 DIAGNOSIS — Z5181 Encounter for therapeutic drug level monitoring: Secondary | ICD-10-CM | POA: Diagnosis not present

## 2016-07-28 DIAGNOSIS — I4891 Unspecified atrial fibrillation: Secondary | ICD-10-CM

## 2016-07-28 LAB — POCT INR: INR: 2.2

## 2016-08-03 ENCOUNTER — Encounter (HOSPITAL_COMMUNITY): Payer: Self-pay | Admitting: Nurse Practitioner

## 2016-08-03 ENCOUNTER — Other Ambulatory Visit: Payer: Self-pay

## 2016-08-03 ENCOUNTER — Telehealth (HOSPITAL_COMMUNITY): Payer: Self-pay | Admitting: *Deleted

## 2016-08-03 ENCOUNTER — Encounter (HOSPITAL_COMMUNITY): Payer: Self-pay

## 2016-08-03 ENCOUNTER — Inpatient Hospital Stay (HOSPITAL_COMMUNITY)
Admission: AD | Admit: 2016-08-03 | Discharge: 2016-08-06 | DRG: 309 | Disposition: A | Discharge status: Home / Self Care | Payer: BC Managed Care – PPO | Source: Ambulatory Visit | Attending: Internal Medicine | Admitting: Internal Medicine

## 2016-08-03 ENCOUNTER — Ambulatory Visit (INDEPENDENT_AMBULATORY_CARE_PROVIDER_SITE_OTHER): Payer: BC Managed Care – PPO

## 2016-08-03 ENCOUNTER — Ambulatory Visit (HOSPITAL_COMMUNITY)
Admission: RE | Admit: 2016-08-03 | Discharge: 2016-08-03 | Disposition: A | Discharge status: Home / Self Care | Payer: BC Managed Care – PPO | Source: Ambulatory Visit | Attending: Nurse Practitioner | Admitting: Nurse Practitioner

## 2016-08-03 VITALS — BP 100/64 | HR 94 | Ht 76.0 in | Wt 268.8 lb

## 2016-08-03 DIAGNOSIS — Z79899 Other long term (current) drug therapy: Secondary | ICD-10-CM | POA: Diagnosis not present

## 2016-08-03 DIAGNOSIS — I4891 Unspecified atrial fibrillation: Secondary | ICD-10-CM

## 2016-08-03 DIAGNOSIS — I480 Paroxysmal atrial fibrillation: Secondary | ICD-10-CM | POA: Diagnosis present

## 2016-08-03 DIAGNOSIS — Z5181 Encounter for therapeutic drug level monitoring: Secondary | ICD-10-CM

## 2016-08-03 DIAGNOSIS — I4819 Other persistent atrial fibrillation: Secondary | ICD-10-CM

## 2016-08-03 DIAGNOSIS — I110 Hypertensive heart disease with heart failure: Secondary | ICD-10-CM | POA: Diagnosis present

## 2016-08-03 DIAGNOSIS — Z7901 Long term (current) use of anticoagulants: Secondary | ICD-10-CM

## 2016-08-03 DIAGNOSIS — I428 Other cardiomyopathies: Secondary | ICD-10-CM | POA: Diagnosis present

## 2016-08-03 DIAGNOSIS — Z8249 Family history of ischemic heart disease and other diseases of the circulatory system: Secondary | ICD-10-CM

## 2016-08-03 DIAGNOSIS — G4733 Obstructive sleep apnea (adult) (pediatric): Secondary | ICD-10-CM | POA: Diagnosis present

## 2016-08-03 DIAGNOSIS — I481 Persistent atrial fibrillation: Principal | ICD-10-CM | POA: Diagnosis present

## 2016-08-03 DIAGNOSIS — E785 Hyperlipidemia, unspecified: Secondary | ICD-10-CM | POA: Diagnosis present

## 2016-08-03 DIAGNOSIS — I5032 Chronic diastolic (congestive) heart failure: Secondary | ICD-10-CM | POA: Diagnosis present

## 2016-08-03 LAB — BASIC METABOLIC PANEL
ANION GAP: 7 (ref 5–15)
BUN: 22 mg/dL — AB (ref 6–20)
CHLORIDE: 107 mmol/L (ref 101–111)
CO2: 24 mmol/L (ref 22–32)
Calcium: 8.9 mg/dL (ref 8.9–10.3)
Creatinine, Ser: 1.14 mg/dL (ref 0.61–1.24)
GFR calc Af Amer: >60 mL/min (ref >60)
Glucose, Bld: 102 mg/dL — ABNORMAL HIGH (ref 65–99)
POTASSIUM: 3.9 mmol/L (ref 3.5–5.1)
SODIUM: 138 mmol/L (ref 135–145)

## 2016-08-03 LAB — POCT INR: INR: 2.1

## 2016-08-03 LAB — POTASSIUM: Potassium: 4.1 mmol/L (ref 3.5–5.1)

## 2016-08-03 LAB — MAGNESIUM: Magnesium: 1.8 mg/dL (ref 1.7–2.4)

## 2016-08-03 MED ORDER — DOFETILIDE 500 MCG PO CAPS
500.0000 ug | ORAL_CAPSULE | Freq: Two times a day (BID) | ORAL | Status: DC
  Administered 2016-08-03 – 2016-08-06 (×6): 500 ug via ORAL
  Filled 2016-08-03 (×6): qty 1

## 2016-08-03 MED ORDER — HYDRALAZINE HCL 25 MG PO TABS
12.5000 mg | ORAL_TABLET | Freq: Three times a day (TID) | ORAL | Status: DC
  Administered 2016-08-03 – 2016-08-06 (×9): 12.5 mg via ORAL
  Filled 2016-08-03 (×9): qty 1

## 2016-08-03 MED ORDER — SODIUM CHLORIDE 0.9% FLUSH
3.0000 mL | Freq: Two times a day (BID) | INTRAVENOUS | Status: DC
  Administered 2016-08-03 – 2016-08-06 (×5): 3 mL via INTRAVENOUS

## 2016-08-03 MED ORDER — WARFARIN - PHARMACIST DOSING INPATIENT: Freq: Every day | Status: DC

## 2016-08-03 MED ORDER — CARVEDILOL 12.5 MG PO TABS
12.5000 mg | ORAL_TABLET | Freq: Every day | ORAL | Status: DC
  Administered 2016-08-03 – 2016-08-05 (×3): 12.5 mg via ORAL
  Filled 2016-08-03 (×3): qty 1

## 2016-08-03 MED ORDER — CARVEDILOL 25 MG PO TABS
25.0000 mg | ORAL_TABLET | Freq: Every morning | ORAL | Status: DC
Start: 2016-08-04 — End: 2016-08-06
  Administered 2016-08-04 – 2016-08-06 (×3): 25 mg via ORAL
  Filled 2016-08-03 (×3): qty 1

## 2016-08-03 MED ORDER — WARFARIN SODIUM 4 MG PO TABS
4.0000 mg | ORAL_TABLET | ORAL | Status: DC
  Administered 2016-08-03: 4 mg via ORAL
  Filled 2016-08-03: qty 1

## 2016-08-03 MED ORDER — ACETAMINOPHEN 325 MG PO TABS
650.0000 mg | ORAL_TABLET | Freq: Four times a day (QID) | ORAL | Status: DC | PRN
  Administered 2016-08-03 – 2016-08-05 (×6): 650 mg via ORAL
  Filled 2016-08-03 (×6): qty 2

## 2016-08-03 MED ORDER — LISINOPRIL 5 MG PO TABS
5.0000 mg | ORAL_TABLET | Freq: Every day | ORAL | Status: DC
  Administered 2016-08-03 – 2016-08-06 (×4): 5 mg via ORAL
  Filled 2016-08-03 (×4): qty 1

## 2016-08-03 MED ORDER — WARFARIN SODIUM 2 MG PO TABS
2.0000 mg | ORAL_TABLET | ORAL | Status: DC
Start: 2016-08-04 — End: 2016-08-04

## 2016-08-03 MED ORDER — PRAVASTATIN SODIUM 20 MG PO TABS
20.0000 mg | ORAL_TABLET | Freq: Every day | ORAL | Status: DC
Start: 2016-08-04 — End: 2016-08-06
  Administered 2016-08-04 – 2016-08-06 (×3): 20 mg via ORAL
  Filled 2016-08-03 (×3): qty 1

## 2016-08-03 MED ORDER — SODIUM CHLORIDE 0.9 % IV SOLN
250.0000 mL | INTRAVENOUS | Status: DC | PRN
  Administered 2016-08-04: 250 mL via INTRAVENOUS
  Administered 2016-08-05: 12:00:00 via INTRAVENOUS

## 2016-08-03 MED ORDER — POTASSIUM CHLORIDE CRYS ER 20 MEQ PO TBCR
40.0000 meq | EXTENDED_RELEASE_TABLET | Freq: Once | ORAL | Status: AC
  Administered 2016-08-03: 40 meq via ORAL
  Filled 2016-08-03: qty 2

## 2016-08-03 MED ORDER — SODIUM CHLORIDE 0.9% FLUSH: 3.0000 mL | INTRAVENOUS | Status: DC | PRN

## 2016-08-03 MED ORDER — MAGNESIUM OXIDE 400 (241.3 MG) MG PO TABS
200.0000 mg | ORAL_TABLET | Freq: Once | ORAL | Status: AC
  Administered 2016-08-03: 200 mg via ORAL
  Filled 2016-08-03: qty 0.5

## 2016-08-03 NOTE — Addendum Note (Signed)
Encounter addended by: Shona Simpson, RN on: 08/03/2016 10:24 AM<BR>    Actions taken: Order list changed

## 2016-08-03 NOTE — H&P (Signed)
H&P    Patient ID: Ruben [name] MRN: [id number], DOB/AGE: [id number] y.o.  Admit date: 08/03/2016 Date of Admit: 08/03/2016   Primary Physician: Aura Dials, MD Primary Cardiologist: Dr. [name]  Reason for Admission: Tikosyn initiation  HPI: Ruben [name] is a 66 y.o. male is referred to the hospital via the Afib clinic for [name] initiation.  He is a patient of Dr. [name] with hx of paroxysmal Afib, had been on amiodarone initally 6 years ago with hx of NICM/AFib though felt that given his age would like to avoid this any further.  His dose initially decreased to 100mg  daily and started having breakthrough AF episodes, and 05/15/15 stopped with the idea of pursuing alternative AAD therapy.  He has hx of NICM in 2012, at that time the patient states he was ill with respiratory infection and found in AF, he has HLD, MV prolapse, OSA compliant with his CPAP.  The patient feels lack of energy when in AF, easily winded, otherwise feels well, denies any CP or rest SOB, no dizziness, near syncope or syncope.  He has not been ill with fever, no N/V/D.  He reports compliance with his warfarin, and understands the process for Tikosyn initiation.  I reviewed this with him, pro-arrhythmia potential and need for admission.  He is aware if not in SR by Wed AM, we will plan for [facility] which he has had open 2 occassions before.  Past Medical History:  Diagnosis Date  . Atrial fibrillation (HCC)   . Cough   . Non-ischemic cardiomyopathy (HCC)       . OSA (obstructive sleep apnea)      Surgical History:  Past Surgical History:  Procedure Laterality Date  . CARDIOVERSION N/A 03/02/2013   Procedure: CARDIOVERSION;  Surgeon: [name], MD;  Location: [facility] ENDOSCOPY;  Service: Cardiovascular;  Laterality: N/A;  . FOOT FRACTURE SURGERY    . PILONIDAL CYST EXCISION       Prescriptions Prior to Admission  Medication Sig Dispense Refill Last Dose  . carvedilol (COREG) 12.5 MG  tablet Take 2 tablets (25mg ) in the morning and 1 tablet (12.5mg ) in the evening. (Patient taking differently: Take 12.5-25 mg by mouth See admin instructions. Take 2 tablets (25mg ) in the morning and 1 tablet (12.5mg ) in the evening.) 90 tablet 3 08/03/2016 at 0600  . hydrALAZINE (APRESOLINE) 25 MG tablet TAKE ONE-HALF TABLET BY MOUTH THREE TIMES DAILY (Patient taking differently: Take 12.5 mg by mouth 3 (three) times daily. ) 135 tablet 3 08/03/2016 at AM  . lisinopril (PRINIVIL,ZESTRIL) 5 MG tablet TAKE ONE TABLET BY MOUTH ONCE DAILY 30 tablet 5 08/02/2016 at Unknown time  . Menthol, Topical Analgesic, (BEN GAY) 1.4 % PTCH Apply 1 patch topically daily as needed (for pain).   08/03/2016 at Unknown time  . pravastatin (PRAVACHOL) 20 MG tablet Take 20 mg by mouth daily.   08/03/2016 at Unknown time  . warfarin (COUMADIN) 2 MG tablet TAKE AS DIRECTED BY  COUMADIN  CLINIC (Patient taking differently: Take 4 mg by mouth Sunday, Monday, Wednesday and Friday. Take 2 mg by mouth daily on all other days.) 170 tablet 1 08/02/2016 at 1730    Inpatient Medications:   Allergies: No Known Allergies  Social History   Social History  . Marital status: Married    Spouse name: N/A  . Number of children: N/A  . Years of education: N/A   Occupational History  . Not on file.   Social History Main Topics  .  Smoking status: Never Smoker  . Smokeless tobacco: Never Used  . Alcohol use No  . Drug use: No  . Sexual activity: Yes    Birth control/ protection: Other-see comments   Other Topics Concern  . Not on file   Social History Narrative  . No narrative on file     Family History  Problem Relation Age of Onset  . Cancer Mother   . Stroke Mother   . Hypertension Mother   . Memory loss Mother   . Prostate cancer Father      Review of Systems: All other systems reviewed and are otherwise negative except as noted above.  Physical Exam: Vitals:   08/03/16 1115 08/03/16 1331  BP: 125/83 127/74    Pulse: 70 86  Resp: 18 16  Temp: 98.8 F (37.1 C) 97.7 F (36.5 C)  TempSrc: Oral Oral  SpO2: 97% 99%  Weight: 266 lb 6.4 oz (120.8 kg)   Height: 6\' 4"  (1.93 m)     GEN- The patient is well appearing, alert and oriented x 3 today.   HEENT: normocephalic, atraumatic; sclera clear, conjunctiva pink; hearing intact; oropharynx clear; neck supple, no JVP Lymph- no cervical lymphadenopathy Lungs- CTA b/l, normal work of breathing.  No wheezes, rales, rhonchi Heart- IRRR, no murmurs, rubs or gallops, PMI not laterally displaced GI- soft, non-tender, non-distended Extremities- no clubbing, cyanosis, or edema MS- no significant deformity or atrophy Skin- warm and dry, no rash or lesion Psych- euthymic mood, full affect Neuro- no gross deficits observed  Labs:   Lab Results  Component Value Date   WBC 5.9 05/11/2016   HGB 14.4 05/11/2016   HCT 43.1 05/11/2016   MCV 96.4 05/11/2016   PLT 268 05/11/2016    Recent Labs Lab 08/03/16 0850  NA 138  K 3.9  CL 107  CO2 24  BUN 22*  CREATININE 1.14  CALCIUM 8.9  GLUCOSE 102*       EKG: today is AFib, 94 bpm, QTc 451 (measured by myself and reviewed with Dr. Ladona Ridgel) Last SR EKG 05/05/16 has QTc TELEMETRY: AFib, 80's, PVC's  06/01/16: TTE Study Conclusions - Left ventricle: The cavity size was moderately dilated. Wall   thickness was increased in a pattern of mild LVH. Systolic   function was normal. The estimated ejection fraction was in the   range of 55% to 60%. The study is not technically sufficient to   allow evaluation of LV diastolic function. - Mitral valve: There was mild regurgitation. - Left atrium: The atrium was moderately dilated. (48mm) Impressions: - Poor acoustic windows limit study  Assessment and Plan:   1. Persistent Afib     Amiodarone stopped 05/14/16 was his last dose     Amiodarone level on 07/21/16 was 0.3 [name], NP discussed with [facility] here, and OK to proceed)     K+ 3.9 (took a  PO dose prior to arrival) will recheck and replace further if needed     Mag 1.8 (took 200mg  PO prior to arrival)     Creat 1.14 (calc clearance is >100)     QTc is difficult to establish in AF, 451 measured by myself, his last SR QTc was in January)     Will start at BID, pending repeat K+    Signed, Francis Dowse, PA-C 08/03/2016 1:34 PM  EP Attending  Patient seen and examined. Agree with the findings as noted above. The patient is a very pleasant 66 year old man  with persistent atrial fibrillation who was on amiodarone and this medication was stopped approximately 3 months ago. His last amiodarone level was 0.03. He has had recurrent atrial fibrillation. He presents today for initiation of dofetilide therapy. His exam demonstrates an irregularly irregular rhythm. The patient is not in acute distress. His lungs are clear. His abdominal exam is soft nontender nondistended, and his extremities demonstrate no peripheral edema. Neurologic exam is nonfocal. His EKG demonstrates atrial fibrillation with a controlled ventricular response. Assessment and plan 1. Persistent atrial fibrillation. I discussed treatment options with the patient. He will be admitted for 3 days of initiation of dofetilide therapy. He will undergo DC cardioversion if he fails to turn to sinus rhythm. His QT interval but we monitored as were his electrolytes and renal function.  Lewayne Bunting, M.D.

## 2016-08-03 NOTE — Telephone Encounter (Signed)
Notification of admission to [facility] completed. Reference #[id number]. Clinic notes sent for review per request to fax [phone].  Pt sent for admission from physician office to initiation of tikosyn

## 2016-08-03 NOTE — Progress Notes (Addendum)
Primary Care Physician: Aura Dials, MD Referring Physician: Dr. Craige Cotta SHMUEL GIRGIS is a 66 y.o. male with a h/o NICM and afib. He was hospitalized in February 2012 and was critically ill with severe systolic left ventricular dysfunction and atrial fibrillation. He was successfully cardioverted back to sinus rhythm in May 2012. He did well until August 2014 when he went back into atrial fibrillation after  Amiodarone reduced to 100 mg daily. His dose was increased and he was cardioverted him successfully on 03/02/13. Since then he has maintained normal sinus rhythm. His echocardiogram in 01/14/13 showed that his ejection fraction had improved to 50-55%.  Due to concerns of long term complications of amiodarone, the dose of drug was decreased, but with subsequent return of atrial fibrillation. He  was in the afib clinic 2/2 to discuss options. He is fairly comfortable in afib, and is rate controlled at rest, but does notice that his heart rate will increase to 120 bpm with exertion.  He does not drink alcohol, no excessive caffeine, no tobacco. He is obese, does not exercise and admits that he eats a lot of junk food. He does have OSA and uses CPAP.  He returns after off amiodarone x one month. He is wanted admission for tikosyn and will draw an amiodarone level today. He will also need 4 consecutive weeks of therapeutic INR's. He was sub therapeutic last week. Followed at the coumadin clinic [address]  He feels ok in rate controlled afib but does see that his energy is not as good. Important to restore SR with h/o   NICM.  F/u in afib clinic 4/16 and he is ready to be admitted for tikosyn. He has had weekly INR's x 5 weeks. Amiodarone level two weeks ago at less than 0.3.Precautions  with tikosyn use reviewed with pt. He can afford drug.  Today, he denies symptoms of  chest pain, orthopnea, PND, lower extremity edema, dizziness, presyncope, syncope, or neurologic sequela.  Positive for exertional dyspnea. The patient is tolerating medications without difficulties and is otherwise without complaint today.   Past Medical History:  Diagnosis Date  . Atrial fibrillation (HCC)   . Cough   . Non-ischemic cardiomyopathy (HCC)       . OSA (obstructive sleep apnea)    Past Surgical History:  Procedure Laterality Date  . CARDIOVERSION N/A 03/02/2013   Procedure: CARDIOVERSION;  Surgeon: [name], MD;  Location: [facility] ENDOSCOPY;  Service: Cardiovascular;  Laterality: N/A;  . FOOT FRACTURE SURGERY    . PILONIDAL CYST EXCISION      Current Outpatient Prescriptions  Medication Sig Dispense Refill  . carvedilol (COREG) 12.5 MG tablet Take 2 tablets (25mg ) in the morning and 1 tablet (12.5mg ) in the evening. 90 tablet 3  . hydrALAZINE (APRESOLINE) 25 MG tablet TAKE ONE-HALF TABLET BY MOUTH THREE TIMES DAILY 135 tablet 3  . lisinopril (PRINIVIL,ZESTRIL) 5 MG tablet TAKE ONE TABLET BY MOUTH ONCE DAILY 30 tablet 5  . pravastatin (PRAVACHOL) 20 MG tablet Take 20 mg by mouth daily.    Marland Kitchen warfarin (COUMADIN) 2 MG tablet TAKE AS DIRECTED BY  COUMADIN  CLINIC 170 tablet 1   No current facility-administered medications for this encounter.     No Known Allergies  Social History   Social History  . Marital status: Married    Spouse name: N/A  . Number of children: N/A  . Years of education: N/A   Occupational History  . Not on file.  Social History Main Topics  . Smoking status: Never Smoker  . Smokeless tobacco: Never Used  . Alcohol use No  . Drug use: No  . Sexual activity: Not on file   Other Topics Concern  . Not on file   Social History Narrative  . No narrative on file    Family History  Problem Relation Age of Onset  . Cancer Mother   . Stroke Mother   . Hypertension Mother   . Memory loss Mother   . Prostate cancer Father     ROS- All systems are reviewed and negative except as per the HPI above  Physical Exam: Vitals:   08/03/16  0851  BP: 100/64  Pulse: 94  Weight: 268 lb 12.8 oz (121.9 kg)  Height: 6\' 4"  (1.93 m)   Wt Readings from Last 3 Encounters:  08/03/16 268 lb 12.8 oz (121.9 kg)  06/19/16 268 lb (121.6 kg)  05/22/16 269 lb (122 kg)    Labs: Lab Results  Component Value Date   NA 139 05/11/2016   K 3.9 05/11/2016   CL 107 05/11/2016   CO2 25 05/11/2016   GLUCOSE 108 (H) 05/11/2016   BUN 16 05/11/2016   CREATININE 1.18 05/11/2016   CALCIUM 8.7 (L) 05/11/2016   PHOS 3.9 05/09/2010   MG 1.8 07/17/2010   Lab Results  Component Value Date   INR 2.1 08/03/2016   Lab Results  Component Value Date   CHOL 175 04/28/2016   HDL 43 04/28/2016   LDLCALC 101 (H) 04/28/2016   TRIG 154 (H) 04/28/2016     GEN- The patient is well appearing, alert and oriented x 3 today.   Head- normocephalic, atraumatic Eyes-  Sclera clear, conjunctiva pink Ears- hearing intact Oropharynx- clear Neck- supple, no JVP Lymph- no cervical lymphadenopathy Lungs- Clear to ausculation bilaterally, normal work of breathing Heart- irregular rate and rhythm, no murmurs, rubs or gallops, PMI not laterally displaced GI- soft, NT, ND, + BS Extremities- no clubbing, cyanosis, or edema MS- no significant deformity or atrophy Skin- no rash or lesion Psych- euthymic mood, full affect Neuro- strength and sensation are intact  EKG- afib at 94 bpm, qrs int 102 ms, qtc 457 ms, EKG in SR reviewed 12/16 qtc 424 ms Echo-2014-Study Conclusions  - Left ventricle: The cavity size was normal. There was mild focal basal hypertrophy of the septum. Systolic function was normal. The estimated ejection fraction was in the range of 50% to 55%. Wall motion was normal; there were no regional wall motion abnormalities. - Ascending aorta: The ascending aorta was mildly dilated. - Mitral valve: Mild prolapse, involving the anterior leaflet and the posterior leaflet. Mild regurgitation. - Left atrium: The atrium was mildly  dilated. - Right atrium: The atrium was mildly dilated. INR- 1.7, 2/27     Assessment and Plan: 1. Afib Has been on amiodarone for six years and is now off drug for concern of long term side effects and has returned to afib Options discussed going forward including cardioversion, rate control, ablation or tikosyn  He would like to  Try tikosyn Amiodarone level acceptable for subsequent loading of tikosyn at <0.3, drawn 4/3 Continue coreg to 25 mg in am and continue with 12.5 mg pm for now Continue warfarin Weekly INR's have been in range x 5 weeks bmet/mag today If labs acceptable, he will be admitted later today when bed is available   2. Lifestyle issues Encouraged healthier diet for weight loss Continue to use CPAP Regular  exercise encouraged  2. H/o NICM Echo updated 2/12- EF 55-60%   Addendum, mag at 1.8 and Kt at 3.9. Will give 200 mg magnesium po and 40 meq of K+po as bed is awaited for hospitalization. Labs to be repeated this pm prior to tikosyn initial dose.   Elvina Sidle Matthew [facility] [address] [place], [place] [address] [phone]

## 2016-08-03 NOTE — Progress Notes (Signed)
ANTICOAGULATION CONSULT NOTE - Initial Consult  Pharmacy Consult for Coumadin Indication: atrial fibrillation  No Known Allergies  Patient Measurements: Height: 6\' 4"  (193 cm) Weight: 266 lb 6.4 oz (120.8 kg) IBW/kg (Calculated) : 86.8  Vital Signs: Temp: 97.7 F (36.5 C) (04/16 1331) Temp Source: Oral (04/16 1331) BP: 127/74 (04/16 1331) Pulse Rate: 86 (04/16 1331)  Labs:  Recent Labs  08/03/16 0823 08/03/16 0850  INR 2.1  --   CREATININE  --  1.14    Estimated Creatinine Clearance: 91.7 mL/min (by C-G formula based on SCr of 1.14 mg/dL).   Medical History: Past Medical History:  Diagnosis Date  . Atrial fibrillation (HCC)   . Cough   . Non-ischemic cardiomyopathy (HCC)       . OSA (obstructive sleep apnea)     Assessment: CC/HPI: Tikosyn  PMH: NICM, afib,EF 50-55% (9/14), OsSA  Anticoag: Coumadin PTA for afib. - Coum dose PTA 4mg  MWFSun, 2mg  TTh,Sat. Admit INR 2.1  Goal of Therapy:  INR 2-3 Monitor platelets by anticoagulation protocol: Yes   Plan:  Magox 200mg  po x 1 Kdur 40 x 1 Tikosyn BID Coumadin 4mg  po x 1 tonight, continue home regimen Daily INR   Kellsie Grindle S. Merilynn Finland, PharmD, BCPS Clinical Staff Pharmacist Pager [phone]  Misty Stanley Stillinger 08/03/2016,2:08 PM

## 2016-08-03 NOTE — Addendum Note (Signed)
Encounter addended by: Newman Nip, NP on: 08/03/2016 10:35 AM<BR>    Actions taken: Sign clinical note

## 2016-08-03 NOTE — Addendum Note (Signed)
Encounter addended by: Shona Simpson, RN on: 08/03/2016 10:47 AM<BR>    Actions taken: [facility] administration accepted

## 2016-08-03 NOTE — Discharge Instructions (Addendum)
You have an appointment set up with the Atrial Fibrillation Clinic.  Multiple studies have shown that being followed by a dedicated atrial fibrillation clinic in addition to the standard care you receive from your other physicians improves health. We believe that enrollment in the atrial fibrillation clinic will allow [place] to better care for you.   The phone number to the Atrial Fibrillation Clinic is [phone]. The clinic is staffed Monday through Friday from 8:30am to 5pm.  Parking Directions: The clinic is located in the Heart and Vascular Building connected to [facility]. 1)From [address] turn on to [address] and go to the 3rd entrance  (Heart and Vascular entrance) on the right. 2)Look to the right for Heart &Vascular Parking Garage. 3)A code for the entrance is required please call the clinic to receive this.   4)Take the elevators to the 1st floor. Registration is in the room with the glass walls at the end of the hallway.  If you have any trouble parking or locating the clinic, please dont hesitate to call [phone].  Information on my medicine - Coumadin   (Warfarin)  This medication education was reviewed with me or my healthcare representative as part of my discharge preparation.  The pharmacist that spoke with me during my hospital stay was:  Pasty Spillers, [facility]  Why was Coumadin prescribed for you? Coumadin was prescribed for you because you have a blood clot or a medical condition that can cause an increased risk of forming blood clots. Blood clots can cause serious health problems by blocking the flow of blood to the heart, lung, or brain. Coumadin can prevent harmful blood clots from forming. As a reminder your indication for Coumadin is:   Stroke Prevention Because Of Atrial Fibrillation  What test will check on my response to Coumadin? While on Coumadin (warfarin) you will need to have an INR test regularly to ensure that your dose is  keeping you in the desired range. The INR (international normalized ratio) number is calculated from the result of the laboratory test called prothrombin time (PT).  If an INR APPOINTMENT HAS NOT ALREADY BEEN MADE FOR YOU please schedule an appointment to have this lab work done by your health care provider within 7 days. Your INR goal is usually a number between:  2 to 3 or your provider may give you a more narrow range like 2-2.5.  Ask your health care provider during an office visit what your goal INR is.  What  do you need to  know  About  COUMADIN? Take Coumadin (warfarin) exactly as prescribed by your healthcare provider about the same time each day.  DO NOT stop taking without talking to the doctor who prescribed the medication.  Stopping without other blood clot prevention medication to take the place of Coumadin may increase your risk of developing a new clot or stroke.  Get refills before you run out.  What do you do if you miss a dose? If you miss a dose, take it as soon as you remember on the same day then continue your regularly scheduled regimen the next day.  Do not take two doses of Coumadin at the same time.  Important Safety Information A possible side effect of Coumadin (Warfarin) is an increased risk of bleeding. You should call your healthcare provider right away if you experience any of the following: ? Bleeding from an injury or your nose that does not stop. ? Unusual colored urine (red or dark  brown) or unusual colored stools (red or black). ? Unusual bruising for unknown reasons. ? A serious fall or if you hit your head (even if there is no bleeding).  Some foods or medicines interact with Coumadin (warfarin) and might alter your response to warfarin. To help avoid this: ? Eat a balanced diet, maintaining a consistent amount of Vitamin K. ? Notify your provider about major diet changes you plan to make. ? Avoid alcohol or limit your intake to 1 drink for women and 2  drinks for men per day. (1 drink is 5 oz. wine, 12 oz. beer, or 1.5 oz. liquor.)  Make sure that ANY health care provider who prescribes medication for you knows that you are taking Coumadin (warfarin).  Also make sure the healthcare provider who is monitoring your Coumadin knows when you have started a new medication including herbals and non-prescription products.  Coumadin (Warfarin)  Major Drug Interactions  Increased Warfarin Effect Decreased Warfarin Effect  Alcohol (large quantities) Antibiotics (esp. Septra/Bactrim, Flagyl, Cipro) Amiodarone (Cordarone) Aspirin (ASA) Cimetidine (Tagamet) Megestrol (Megace) NSAIDs (ibuprofen, naproxen, etc.) Piroxicam (Feldene) Propafenone (Rythmol SR) Propranolol (Inderal) Isoniazid (INH) Posaconazole (Noxafil) Barbiturates (Phenobarbital) Carbamazepine (Tegretol) Chlordiazepoxide (Librium) Cholestyramine (Questran) Griseofulvin Oral Contraceptives Rifampin Sucralfate (Carafate) Vitamin K   Coumadin (Warfarin) Major Herbal Interactions  Increased Warfarin Effect Decreased Warfarin Effect  Garlic Ginseng Ginkgo biloba Coenzyme Q10 Green tea St. Johns wort    Coumadin (Warfarin) FOOD Interactions  Eat a consistent number of servings per week of foods HIGH in Vitamin K (1 serving =  cup)  Collards (cooked, or boiled & drained) Kale (cooked, or boiled & drained) Mustard greens (cooked, or boiled & drained) Parsley *serving size only =  cup Spinach (cooked, or boiled & drained) Swiss chard (cooked, or boiled & drained) Turnip greens (cooked, or boiled & drained)  Eat a consistent number of servings per week of foods MEDIUM-HIGH in Vitamin K (1 serving = 1 cup)  Asparagus (cooked, or boiled & drained) Broccoli (cooked, boiled & drained, or raw & chopped) Brussel sprouts (cooked, or boiled & drained) *serving size only =  cup Lettuce, raw (green leaf, endive, romaine) Spinach, raw Turnip greens, raw & chopped   These  websites have more information on Coumadin (warfarin):  http://www.king-russell.com/; https://www.hines.net/;

## 2016-08-03 NOTE — Progress Notes (Signed)
Pharmacy Review for Dofetilide (Tikosyn) Initiation  Admit Complaint: 66 y.o. male admitted 08/03/2016 with atrial fibrillation to be initiated on dofetilide.   Assessment:  Patient Exclusion Criteria: If any screening criteria checked as "Yes", then  patient  should NOT receive dofetilide until criteria item is corrected. If "Yes" please indicate correction plan.  YES  NO Patient  Exclusion Criteria Correction Plan  []  []  Baseline QTc interval is greater than or equal to 440 msec. IF above YES box checked dofetilide contraindicated unless patient has ICD; then may proceed if QTc 500-550 msec or with known ventricular conduction abnormalities may proceed with QTc 550-600 msec. QTc =  457 Addressed in PA's note  []  [x]  Magnesium level is less than 1.8 mEq/l : Last magnesium:  Lab Results  Component Value Date   MG 1.8 08/03/2016       Magox 200mg  x 1  []  []  Potassium level is less than 4 mEq/l : Last potassium:  Lab Results  Component Value Date   K 3.9 08/03/2016       Kdur 40 x 1  []  [x]  Patient is known or suspected to have a digoxin level greater than 2 ng/ml: Lab Results  Component Value Date   DIGOXIN 0.3 (L) 05/11/2010      []  [x]  Creatinine clearance less than 20 ml/min (calculated using Cockcroft-Gault, actual body weight and serum creatinine): Estimated Creatinine Clearance: 91.7 mL/min (by C-G formula based on SCr of 1.14 mg/dL).    []  [x]  Patient has received drugs known to prolong the QT intervals within the last 48 hours (phenothiazines, tricyclics or tetracyclic antidepressants, erythromycin, H-1 antihistamines, cisapride, fluoroquinolones, azithromycin). Drugs not listed above may have an, as yet, undetected potential to prolong the QT interval, updated information on QT prolonging agents is available at this website:QT prolonging agents   []  [x]  Patient received a dose of hydrochlorothiazide (Oretic) alone or in any combination including triamterene (Dyazide,  Maxzide) in the last 48 hours.   []  [x]  Patient received a medication known to increase dofetilide plasma concentrations prior to initial dofetilide dose:  . Trimethoprim (Primsol, Proloprim) in the last 36 hours . Verapamil (Calan, Verelan) in the last 36 hours or a sustained release dose in the last 72 hours . Megestrol (Megace) in the last 5 days  . Cimetidine (Tagamet) in the last 6 hours . Ketoconazole (Nizoral) in the last 24 hours . Itraconazole (Sporanox) in the last 48 hours  . Prochlorperazine (Compazine) in the last 36 hours    []  [x]  Patient is known to have a history of torsades de pointes; congenital or acquired long QT syndromes.   []  [x]  Patient has received a Class 1 antiarrhythmic with less than 2 half-lives since last dose. (Disopyramide, Quinidine, Procainamide, Lidocaine, Mexiletine, Flecainide, Propafenone)   []  [x]  Patient has received amiodarone therapy in the past 3 months or amiodarone level is greater than 0.3 ng/ml. (Amio stopped 05/14/16, but level <0.3)  Amiodarone level on 07/21/16 was 0.3    Patient has been appropriately anticoagulated with  Coumadin.  Ordering provider was confirmed at [email or web address] if they are not listed on the [facility] Authorized Prescribers list.  Goal of Therapy: Follow renal function, electrolytes, potential drug interactions, and dose adjustment. Provide education and 1 week supply at discharge.  Plan:  [x]   Physician selected initial dose within range recommended for patients level of renal function - will monitor for response.  []   Physician selected initial dose outside of range recommended for patients level  of renal function - will discuss if the dose should be altered at this time.   Select One Calculated CrCl  Dose q12h  [x]  > 60 ml/min 500 mcg  []  40-60 ml/min 250 mcg  []  20-40 ml/min 125 mcg   2. Follow up QTc after the first 5 doses, renal function, electrolytes (K & Mg) daily x 3     days, dose adjustment, success of  initiation and facilitate 1 week discharge supply as     clinically indicated.  3. Initiate Tikosyn education video (Call [phone] and ask for Tikosyn Video # 116).  4. Place Enrollment Form on the chart for discharge supply of dofetilide.  Kehlani Vancamp S. Merilynn Finland, PharmD, BCPS Clinical Staff Pharmacist Pager [phone]  Misty Stanley Stillinger 2:03 PM 08/03/2016

## 2016-08-04 LAB — BASIC METABOLIC PANEL
ANION GAP: 7 (ref 5–15)
BUN: 20 mg/dL (ref 6–20)
CHLORIDE: 106 mmol/L (ref 101–111)
CO2: 25 mmol/L (ref 22–32)
Calcium: 8.7 mg/dL — ABNORMAL LOW (ref 8.9–10.3)
Creatinine, Ser: 1.12 mg/dL (ref 0.61–1.24)
GFR calc non Af Amer: >60 mL/min (ref >60)
GLUCOSE: 110 mg/dL — AB (ref 65–99)
Potassium: 4.1 mmol/L (ref 3.5–5.1)
Sodium: 138 mmol/L (ref 135–145)

## 2016-08-04 LAB — PROTIME-INR
INR: 1.95
Prothrombin Time: 22.5 seconds — ABNORMAL HIGH (ref 11.4–15.2)

## 2016-08-04 LAB — MAGNESIUM: MAGNESIUM: 1.8 mg/dL (ref 1.7–2.4)

## 2016-08-04 MED ORDER — WARFARIN SODIUM 7.5 MG PO TABS
7.5000 mg | ORAL_TABLET | ORAL | Status: AC
  Administered 2016-08-04: 7.5 mg via ORAL
  Filled 2016-08-04: qty 1

## 2016-08-04 MED ORDER — SODIUM CHLORIDE 0.9 % IV SOLN: 250.0000 mL | INTRAVENOUS | Status: DC

## 2016-08-04 MED ORDER — SODIUM CHLORIDE 0.9% FLUSH: 3.0000 mL | Freq: Two times a day (BID) | INTRAVENOUS | Status: DC

## 2016-08-04 MED ORDER — MAGNESIUM SULFATE IN D5W 1-5 GM/100ML-% IV SOLN
1.0000 g | Freq: Once | INTRAVENOUS | Status: AC
  Administered 2016-08-04: 1 g via INTRAVENOUS
  Filled 2016-08-04: qty 100

## 2016-08-04 MED ORDER — SODIUM CHLORIDE 0.9% FLUSH: 3.0000 mL | INTRAVENOUS | Status: DC | PRN

## 2016-08-04 MED ORDER — ENOXAPARIN SODIUM 120 MG/0.8ML ~~LOC~~ SOLN
1.0000 mg/kg | Freq: Once | SUBCUTANEOUS | Status: AC
  Administered 2016-08-04: 120 mg via SUBCUTANEOUS
  Filled 2016-08-04: qty 0.81

## 2016-08-04 NOTE — Progress Notes (Signed)
ANTICOAGULATION CONSULT NOTE - f/u Consult  Pharmacy Consult for Coumadin Indication: atrial fibrillation  No Known Allergies  Patient Measurements: Height: 6\' 4"  (193 cm) Weight: 266 lb 6.4 oz (120.8 kg) IBW/kg (Calculated) : 86.8  Vital Signs: Temp: 97.4 F (36.3 C) (04/17 0848) Temp Source: Oral (04/17 0848) BP: 105/66 (04/17 0848) Pulse Rate: 93 (04/17 0848)  Labs:  Recent Labs  08/03/16 0823 08/03/16 0850 08/04/16 0342  LABPROT  --   --  22.5*  INR 2.1  --  1.95  CREATININE  --  1.14 1.12    Estimated Creatinine Clearance: 93.4 mL/min (by C-G formula based on SCr of 1.12 mg/dL).   Medical History: Past Medical History:  Diagnosis Date  . Atrial fibrillation (HCC)   . Cough   . Non-ischemic cardiomyopathy (HCC)       . OSA (obstructive sleep apnea)     Assessment: CC/HPI: Tikosyn  PMH: NICM, afib,EF 50-55% (9/14), OsSA  Anticoag: Coumadin PTA for afib. INR down to 1.95 this AM. MD ordered Lovenox 120mg  SQ x 1 this AM. - Coum dose PTA 4mg  MWFSun, 2mg  TTh,Sat. Admit INR 2.1  Goal of Therapy:  INR 2-3 Monitor platelets by anticoagulation protocol: Yes   Plan:  Magnesium 1g IV x 1 Tikosyn BID Lovenox 120mg  x 1 4/17 AM. Coumadin 7.5 mg po x 1 NOW Daily INR   Lorian Yaun S. Merilynn Finland, PharmD, BCPS Clinical Staff Pharmacist Pager [phone]  Misty Stanley Stillinger 08/04/2016,9:23 AM

## 2016-08-04 NOTE — Progress Notes (Addendum)
  SUBJECTIVE: The patient is doing well today.  At this time, he denies chest pain, shortness of breath, or any new concerns.  . carvedilol  12.5 mg Oral Q2200  . carvedilol  25 mg Oral q morning - 10a  . dofetilide  500 mcg Oral BID  . hydrALAZINE  12.5 mg Oral Q8H  . lisinopril  5 mg Oral Daily  . pravastatin  20 mg Oral Daily  . sodium chloride flush  3 mL Intravenous Q12H  . warfarin  7.5 mg Oral STAT  . Warfarin - Pharmacist Dosing Inpatient   Does not apply q1800   . sodium chloride    . magnesium sulfate 1 - 4 g bolus IVPB      OBJECTIVE: Physical Exam: Vitals:   08/03/16 2041 08/03/16 2353 08/04/16 0500 08/04/16 0848  BP: 97/62  99/74 105/66  Pulse: 74 75 67 93  Resp: 18 19 18   Temp: 98.1 F (36.7 C)  97.4 F (36.3 C) 97.4 F (36.3 C)  TempSrc: Oral  Oral Oral  SpO2: 98% 99% 100% 99%  Weight:      Height:        Intake/Output Summary (Last 24 hours) at 08/04/16 0945 Last data filed at 08/03/16 2000  Gross per 24 hour  Intake              480 ml  Output                0 ml  Net              480 ml    Telemetry is riewed by myself/Dr. Tiffanie Blassingame: AFib, 80's, occ PVC's  GEN- The patient is well appearing, alert and oriented x 3 today.   Head- normocephalic, atraumatic Eyes-  Sclera clear, conjunctiva pink Ears- hearing intact Oropharynx- clear Neck- supple, no JVP Lungs- CTA b/l, normal work of breathing Heart- IRRR, no significant murmurs, no rubs or gallops GI- soft, NT, ND Extremities- no clubbing, cyanosis, or edema Skin- no rash or lesion Psych- euthymic mood, full affect Neuro- no gross deficits appreciated  LABS: Basic Metabolic Panel:  Recent Labs  08/03/16 0850 08/03/16 1457 08/04/16 0342  NA 138  --  138  K 3.9 4.1 4.1  CL 107  --  106  CO2 24  --  25  GLUCOSE 102*  --  110*  BUN 22*  --  20  CREATININE 1.14  --  1.12  CALCIUM 8.9  --  8.7*  MG 1.8  --  1.8    ASSESSMENT AND PLAN:   1. Persistent Afib     K+ 4.1     Mag  1.8 (replacement ordered)     Creat 1.12 (calc clearance is >100)     EKG reviewed with Dr. Mertis Mosher, stable to continue     INR has been therapeutic weekly to admission for 4 weeks, this AM 1.95, in discussion with Dr. Myla Mauriello, one dose of 1mg/kg lovenox given this AM, and OK to proceed      Plan for DCCV tomorrow if not in SR  Renee Ursuy, PA-C 08/04/2016 9:45 AM  EP Attending  Patient seen and examined. He is doing well but remains in atrial fib with a controlled VR. His QT is acceptable. Despite a therapeutic INR yesterday, it is 1.95 today. Will give a dose of lovenox, continue his coumadin and check tomorrow. I would anticipate DCCV tomorrow if he does not return to NSR. His exam reveals   an IRIR rhythm, clear lungs and no edema. Neuro is intact.   Marykate Heuberger,M.D. 

## 2016-08-04 NOTE — Progress Notes (Signed)
Placed patient on CPAP for the night at [facility]

## 2016-08-04 NOTE — Care Management Note (Addendum)
Case Management Note  Patient Details  Name: Ruben [name] MRN: [id number] Date of Birth: 1950-10-06  Subjective/Objective: Pt presented for Tikosyn Load. Pt will need Rx for 7 day supply to be filled via Main Pharmacy.  CM will assist with 7 day Rx from Main Pharmacy. Per pt he has Tricare as well and co pay for generic will be $11.00. Pt uses [facility] for medications and medication can be ordered. No further needs from CM at this time.              Action/Plan: Benefits Check completed:  S/W JESSICA @ [facility] # [phone]   1. TIKOSYN  125 MCG BID  COVER- YES  CO-PAY- $ 133.00  PRIOR APPROVAL- YES # [phone]   2 TIKOSYN  250 MCG BID  SAME AS ABOVE   3 TIKOSYN 500 MCG BID  SAME AS ABOVE    4. DOFETILIDE  125 MCG BID  COVER- YES  CO-PAY- $ 38.57  PRIOR APPROVAL- YES # [phone]   5. DOFETILIDE 250 MCG BID  SAME AS ABOVE   6. DOFETILIDE 500 MCG BID  SAME AS ABOVE   PHARMACY : [facility], [facility], [facility] AND [facility]  Expected Discharge Date:                  Expected Discharge Plan:  Home/Self Care  In-House Referral:  NA  Discharge planning Services  CM Consult, Medication Assistance  Post Acute Care Choice:  NA Choice offered to:  NA  DME Arranged:  N/A DME Agency:  NA  HH Arranged:  NA HH Agency:  NA  Status of Service:  Completed, signed off  If discussed at Long Length of Stay Meetings, dates discussed:    Additional Comments:  Gala Lewandowsky, RN 08/04/2016, 12:25 PM

## 2016-08-04 NOTE — Progress Notes (Signed)
Pt's INR this am is 1.95. Called pharmacy since it is less than 2. Advised he should be ok for now and will continue to monitor.

## 2016-08-05 ENCOUNTER — Encounter (HOSPITAL_COMMUNITY): Payer: Self-pay | Admitting: Cardiovascular Disease

## 2016-08-05 ENCOUNTER — Encounter (HOSPITAL_COMMUNITY)
Admission: AD | Disposition: A | Discharge status: Home / Self Care | Payer: Self-pay | Source: Ambulatory Visit | Attending: Internal Medicine

## 2016-08-05 ENCOUNTER — Inpatient Hospital Stay (HOSPITAL_COMMUNITY): Payer: BC Managed Care – PPO | Admitting: Certified Registered Nurse Anesthetist

## 2016-08-05 DIAGNOSIS — I481 Persistent atrial fibrillation: Principal | ICD-10-CM

## 2016-08-05 HISTORY — PX: CARDIOVERSION: SHX1299

## 2016-08-05 LAB — BASIC METABOLIC PANEL
Anion gap: 6 (ref 5–15)
BUN: 16 mg/dL (ref 6–20)
CO2: 25 mmol/L (ref 22–32)
Calcium: 8.8 mg/dL — ABNORMAL LOW (ref 8.9–10.3)
Chloride: 106 mmol/L (ref 101–111)
Creatinine, Ser: 1.13 mg/dL (ref 0.61–1.24)
Glucose, Bld: 109 mg/dL — ABNORMAL HIGH (ref 65–99)
POTASSIUM: 3.9 mmol/L (ref 3.5–5.1)
SODIUM: 137 mmol/L (ref 135–145)

## 2016-08-05 LAB — PROTIME-INR
INR: 2.18
PROTHROMBIN TIME: 24.7 s — AB (ref 11.4–15.2)

## 2016-08-05 LAB — MAGNESIUM: MAGNESIUM: 2 mg/dL (ref 1.7–2.4)

## 2016-08-05 SURGERY — CARDIOVERSION
Anesthesia: General

## 2016-08-05 MED ORDER — WARFARIN SODIUM 4 MG PO TABS
4.0000 mg | ORAL_TABLET | Freq: Once | ORAL | Status: AC
  Administered 2016-08-05: 4 mg via ORAL
  Filled 2016-08-05: qty 1

## 2016-08-05 MED ORDER — PROPOFOL 10 MG/ML IV BOLUS
INTRAVENOUS | Status: DC | PRN
  Administered 2016-08-05: 80 mg via INTRAVENOUS

## 2016-08-05 MED ORDER — LIDOCAINE 2% (20 MG/ML) 5 ML SYRINGE
INTRAMUSCULAR | Status: DC | PRN
  Administered 2016-08-05: 100 mg via INTRAVENOUS

## 2016-08-05 MED ORDER — POTASSIUM CHLORIDE CRYS ER 10 MEQ PO TBCR
30.0000 meq | EXTENDED_RELEASE_TABLET | Freq: Once | ORAL | Status: AC
  Administered 2016-08-05: 30 meq via ORAL
  Filled 2016-08-05: qty 1

## 2016-08-05 NOTE — Transfer of Care (Signed)
Immediate Anesthesia Transfer of Care Note  Patient: [name]  Procedure(s) Performed: Procedure(s): CARDIOVERSION (N/A)  Patient Location: Endoscopy Unit  Anesthesia Type:General  Level of Consciousness: awake, alert  and oriented  Airway & Oxygen Therapy: Patient Spontanous Breathing and Patient connected to nasal cannula oxygen  Post-op Assessment: Report given to RN and Post -op Vital signs reviewed and stable  Post vital signs: Reviewed and stable  Last Vitals:  Vitals:   08/05/16 0829 08/05/16 1134  BP: 124/80 106/74  Pulse: 93   Resp:  16  Temp:  36.6 C    Last Pain:  Vitals:   08/05/16 1134  TempSrc: Oral  PainSc: 2       Patients Stated Pain Goal: 0 (08/05/16 1053)  Complications: No apparent anesthesia complications

## 2016-08-05 NOTE — Progress Notes (Signed)
Progress Note  Patient Name: Ruben [name] Date of Encounter: 08/05/2016  Primary Cardiologist: Dr. Duke Salvia  Subjective   No chest pain or sob.   Inpatient Medications    Scheduled Meds: . carvedilol  12.5 mg Oral Q2200  . carvedilol  25 mg Oral q morning - 10a  . dofetilide  500 mcg Oral BID  . hydrALAZINE  12.5 mg Oral Q8H  . lisinopril  5 mg Oral Daily  . pravastatin  20 mg Oral Daily  . sodium chloride flush  3 mL Intravenous Q12H  . sodium chloride flush  3 mL Intravenous Q12H  . Warfarin - Pharmacist Dosing Inpatient   Does not apply q1800   Continuous Infusions: . sodium chloride 250 mL (08/04/16 0947)  . sodium chloride     PRN Meds: sodium chloride, acetaminophen, sodium chloride flush, sodium chloride flush   Vital Signs    Vitals:   08/04/16 2259 08/05/16 0424 08/05/16 0618 08/05/16 0829  BP: 109/81 108/74 110/77 124/80  Pulse: 73 75  93  Resp: 18 16    Temp: 98.2 F (36.8 C) 97.5 F (36.4 C)    TempSrc: Oral Oral    SpO2: 98% 100%    Weight:  263 lb (119.3 kg)    Height:        Intake/Output Summary (Last 24 hours) at 08/05/16 0951 Last data filed at 08/05/16 0900  Gross per 24 hour  Intake              480 ml  Output              530 ml  Net              -50 ml   Filed Weights   08/03/16 1115 08/05/16 0424  Weight: 266 lb 6.4 oz (120.8 kg) 263 lb (119.3 kg)    Telemetry    Atrial fib with a controlled VR - Personally Reviewed  ECG    Atrial fib with a controlled VR. QTC difficult to evaluate due to coarse atrial fib but probably acceptable - Personally Reviewed  Physical Exam   GEN: No acute distress.   Neck: No JVD Cardiac: IRIRR, no murmurs, rubs, or gallops.  Respiratory: Clear to auscultation bilaterally. GI: Soft, nontender, non-distended  MS: No edema; No deformity. Neuro:  Nonfocal  Psych: Normal affect   Labs    Chemistry Recent Labs Lab 08/03/16 0850 08/03/16 1457 08/04/16 0342 08/05/16 0416  NA 138   --  138 137  K 3.9 4.1 4.1 3.9  CL 107  --  106 106  CO2 24  --  25 25  GLUCOSE 102*  --  110* 109*  BUN 22*  --  20 16  CREATININE 1.14  --  1.12 1.13  CALCIUM 8.9  --  8.7* 8.8*  GFRNONAA >60  --  >60 >60  GFRAA >60  --  >60 >60  ANIONGAP 7  --  7 6     HematologyNo results for input(s): WBC, RBC, HGB, HCT, MCV, MCH, MCHC, RDW, PLT in the last 168 hours.  Cardiac EnzymesNo results for input(s): TROPONINI in the last 168 hours. No results for input(s): TROPIPOC in the last 168 hours.   BNPNo results for input(s): BNP, PROBNP in the last 168 hours.   DDimer No results for input(s): DDIMER in the last 168 hours.   Radiology    No results found.  Cardiac Studies   none  Patient Profile  66 y.o. male admitted for intiation of Tikosyn, pending DCCV later today  Assessment & Plan    1. Persistent atrial fib - he is tolerating dofetilide. Plan DCCV later today. Will follow QT closely 2. HTN heart disease - his blood pressure has been well controlled. No change in meds.  Signed, Lewayne Bunting, MD  08/05/2016, 9:51 AM  Patient ID: Ruben [name], male   DOB: 1951-01-04, 66 y.o.   MRN: [id number]

## 2016-08-05 NOTE — Interval H&P Note (Signed)
History and Physical Interval Note:  08/05/2016 9:12 AM  Ruben [name]  has presented today for surgery, with the diagnosis of AFIB  The various methods of treatment have been discussed with the patient and family. After consideration of risks, benefits and other options for treatment, the patient has consented to  Procedure(s): CARDIOVERSION (N/A) as a surgical intervention .  The patient's history has been reviewed, patient examined, no change in status, stable for surgery.  I have reviewed the patient's chart and labs.  Questions were answered to the patient's satisfaction.     Chilton Si, MD

## 2016-08-05 NOTE — CV Procedure (Signed)
Electrical Cardioversion Procedure Note Ruben [name] [id number] Oct 16, 1950  Procedure: Electrical Cardioversion Indications:  Atrial Fibrillation  Procedure Details Consent: Risks of procedure as well as the alternatives and risks of each were explained to the (patient/caregiver).  Consent for procedure obtained. Time Out: Verified patient identification, verified procedure, site/side was marked, verified correct patient position, special equipment/implants available, medications/allergies/relevent history reviewed, required imaging and test results available.  Performed  Patient placed on cardiac monitor, pulse oximetry, supplemental oxygen as necessary.  Sedation given: Propofol 80 mg Pacer pads placed anterior and posterior chest.  Cardioverted 2 time(s). 150J then 200 J Cardioverted at successful.  Evaluation Findings: Post procedure EKG shows: sinus bradycardia Complications: None Patient did tolerate procedure well.   Chilton Si, MD 08/05/2016, 12:13 PM

## 2016-08-05 NOTE — Progress Notes (Signed)
ANTICOAGULATION CONSULT NOTE - Follow Up Consult  Pharmacy Consult for Coumadin Indication: atrial fibrillation  No Known Allergies  Patient Measurements: Height: 6\' 4"  (193 cm) Weight: 263 lb (119.3 kg) IBW/kg (Calculated) : 86.8  Vital Signs: Temp: 97.7 F (36.5 C) (04/18 1221) Temp Source: Oral (04/18 1221) BP: 106/75 (04/18 1235) Pulse Rate: 57 (04/18 1235)  Labs:  Recent Labs  08/03/16 0823 08/03/16 0850 08/04/16 0342 08/05/16 0416  LABPROT  --   --  22.5* 24.7*  INR 2.1  --  1.95 2.18  CREATININE  --  1.14 1.12 1.13    Estimated Creatinine Clearance: 92 mL/min (by C-G formula based on SCr of 1.13 mg/dL).   Medications:  Scheduled:  . carvedilol  12.5 mg Oral Q2200  . carvedilol  25 mg Oral q morning - 10a  . dofetilide  500 mcg Oral BID  . hydrALAZINE  12.5 mg Oral Q8H  . lisinopril  5 mg Oral Daily  . pravastatin  20 mg Oral Daily  . sodium chloride flush  3 mL Intravenous Q12H  . sodium chloride flush  3 mL Intravenous Q12H  . Warfarin - Pharmacist Dosing Inpatient   Does not apply q1800    Assessment: 66yo male on Coumadin for AFib, s/p cardioversion this AM.  INR therapeutic after bumped up dose yesterday.  No bleeding noted.   Goal of Therapy:  INR 2-3 Monitor platelets by anticoagulation protocol: Yes   Plan:  Coumadin 4mg  po today Watch for s/s of bleeding  Marisue Humble, PharmD Clinical Pharmacist [facility]

## 2016-08-05 NOTE — H&P (View-Only) (Signed)
SUBJECTIVE: The patient is doing well today.  At this time, he denies chest pain, shortness of breath, or any new concerns.  . carvedilol  12.5 mg Oral Q2200  . carvedilol  25 mg Oral q morning - 10a  . dofetilide  500 mcg Oral BID  . hydrALAZINE  12.5 mg Oral Q8H  . lisinopril  5 mg Oral Daily  . pravastatin  20 mg Oral Daily  . sodium chloride flush  3 mL Intravenous Q12H  . warfarin  7.5 mg Oral STAT  . Warfarin - Pharmacist Dosing Inpatient   Does not apply q1800   . sodium chloride    . magnesium sulfate 1 - 4 g bolus IVPB      OBJECTIVE: Physical Exam: Vitals:   08/03/16 2041 08/03/16 2353 08/04/16 0500 08/04/16 0848  BP: 97/62  99/74 105/66  Pulse: 74 75 67 93  Resp: 18 19 18    Temp: 98.1 F (36.7 C)  97.4 F (36.3 C) 97.4 F (36.3 C)  TempSrc: Oral  Oral Oral  SpO2: 98% 99% 100% 99%  Weight:      Height:        Intake/Output Summary (Last 24 hours) at 08/04/16 0945 Last data filed at 08/03/16 2000  Gross per 24 hour  Intake              480 ml  Output                0 ml  Net              480 ml    Telemetry is riewed by myself/Dr. Ladona Ridgel: AFib, 80's, occ PVC's  GEN- The patient is well appearing, alert and oriented x 3 today.   Head- normocephalic, atraumatic Eyes-  Sclera clear, conjunctiva pink Ears- hearing intact Oropharynx- clear Neck- supple, no JVP Lungs- CTA b/l, normal work of breathing Heart- IRRR, no significant murmurs, no rubs or gallops GI- soft, NT, ND Extremities- no clubbing, cyanosis, or edema Skin- no rash or lesion Psych- euthymic mood, full affect Neuro- no gross deficits appreciated  LABS: Basic Metabolic Panel:  Recent Labs  54/65/68 0850 08/03/16 1457 08/04/16 0342  NA 138  --  138  K 3.9 4.1 4.1  CL 107  --  106  CO2 24  --  25  GLUCOSE 102*  --  110*  BUN 22*  --  20  CREATININE 1.14  --  1.12  CALCIUM 8.9  --  8.7*  MG 1.8  --  1.8    ASSESSMENT AND PLAN:   1. Persistent Afib     K+ 4.1     Mag  1.8 (replacement ordered)     Creat 1.12 (calc clearance is >100)     EKG reviewed with Dr. Ladona Ridgel, stable to continue     INR has been therapeutic weekly to admission for 4 weeks, this AM 1.95, in discussion with Dr. Ladona Ridgel, one dose of 1mg /kg lovenox given this AM, and OK to proceed      Plan for DCCV tomorrow if not in SR  Francis Dowse, PA-C 08/04/2016 9:45 AM  EP Attending  Patient seen and examined. He is doing well but remains in atrial fib with a controlled VR. His QT is acceptable. Despite a therapeutic INR yesterday, it is 1.95 today. Will give a dose of lovenox, continue his coumadin and check tomorrow. I would anticipate DCCV tomorrow if he does not return to NSR. His exam reveals  an IRIR rhythm, clear lungs and no edema. Neuro is intact.   Leonia Reeves.D.

## 2016-08-05 NOTE — Anesthesia Preprocedure Evaluation (Addendum)
Anesthesia Evaluation  Patient identified by MRN, date of birth, ID band Patient awake    Reviewed: Allergy & Precautions, H&P , NPO status , Patient's Chart, lab work & pertinent test results  Airway Mallampati: III  TM Distance: >3 FB Neck ROM: Full    Dental no notable dental hx.    Pulmonary sleep apnea ,    Pulmonary exam normal breath sounds clear to auscultation       Cardiovascular +CHF  Normal cardiovascular exam+ dysrhythmias Atrial Fibrillation  Rhythm:Regular Rate:Normal  Echo 05/2016 - Left ventricle: The cavity size was moderately dilated. Wall thickness was increased in a pattern of mild LVH. Systolic function was normal. The estimated ejection fraction was in the range of 55% to 60%. The study is not technically sufficient to allow evaluation of LV diastolic function. - Mitral valve: There was mild regurgitation. - Left atrium: The atrium was moderately dilated.   Neuro/Psych negative neurological ROS  negative psych ROS   GI/Hepatic negative GI ROS, Neg liver ROS,   Endo/Other  negative endocrine ROS  Renal/GU negative Renal ROS  negative genitourinary   Musculoskeletal   Abdominal   Peds  Hematology negative hematology ROS (+)   Anesthesia Other Findings   Reproductive/Obstetrics negative OB ROS                           Anesthesia Physical Anesthesia Plan  ASA: III  Anesthesia Plan: General   Post-op Pain Management:    Induction: Intravenous  Airway Management Planned: Mask  Additional Equipment:   Intra-op Plan:   Post-operative Plan:   Informed Consent: I have reviewed the patients History and Physical, chart, labs and discussed the procedure including the risks, benefits and alternatives for the proposed anesthesia with the patient or authorized representative who has indicated his/her understanding and acceptance.   Dental advisory given  Plan Discussed  with: CRNA  Anesthesia Plan Comments:         Anesthesia Quick Evaluation

## 2016-08-05 NOTE — Progress Notes (Signed)
Pt. placed on CPAP for h/s, humidity filled, room air, tolerating well, RN made aware.

## 2016-08-05 NOTE — Anesthesia Postprocedure Evaluation (Signed)
Anesthesia Post Note  Patient: Ruben [name]  Procedure(s) Performed: Procedure(s) (LRB): CARDIOVERSION (N/A)  Patient location during evaluation: PACU Anesthesia Type: General Level of consciousness: sedated and patient cooperative Pain management: pain level controlled Vital Signs Assessment: post-procedure vital signs reviewed and stable Respiratory status: spontaneous breathing Cardiovascular status: stable Anesthetic complications: no       Last Vitals:  Vitals:   08/05/16 1221 08/05/16 1235  BP: 95/70 106/75  Pulse:  (!) 57  Resp: 14 14  Temp: 36.5 C     Last Pain:  Vitals:   08/05/16 1221  TempSrc: Oral  PainSc:                  [name]

## 2016-08-05 NOTE — Discharge Summary (Signed)
ELECTROPHYSIOLOGY PROCEDURE DISCHARGE SUMMARY    Patient ID: Ruben [name],  MRN: [id number], DOB/AGE: [birth year] 66 y.o.  Admit date: 08/03/2016 Discharge date: 08/06/16  Primary Care Physician: Aura Dials, MD  Primary Cardiologist: Dr. [name] Electrophysiologist: Dr. Ladona Ridgel this admission  Primary Discharge Diagnosis:  1.  Persistent atrial fibrillation status post Tikosyn loading this admission      CHA2DS2Vasc is at least 2, on warfarin (monitored and managed with coumadin clinic)  Secondary Discharge Diagnosis:  1. NICM (suspect to be tachy mediataed vs viral) 2. OSA compliant with CPAP  No Known Allergies   Procedures This Admission:  1.  Tikosyn loading 2.  Direct current cardioversion on 08/05/16 by Dr Duke Salvia which successfully restored SR.  There were no early apparent complications.   Brief HPI: Ruben [name] is a 66 y.o. male with a past medical history as noted above.  He was referred to the AFib clinic in the outpatient setting for treatment options of atrial fibrillation.  Risks, benefits, and alternatives to Tikosyn were reviewed with the patient who wished to proceed.    Hospital Course:  The patient was admitted and Tikosyn was initiated.  Renal function and electrolytes were followed during the hospitalization.  His amiodarone level was noted at 0.3 on 07/21/16 and cleared with [facility] to proceed. Their QTc remained stable.  INR dipped to 1.95 on day #2 and covered with a dose of therapeutic lovenox, subsequently remained therapeutic.  On 08/05/16 he underwent direct current cardioversion which restored sinus rhythm.  The patient was monitored until discharge on telemetry which demonstrated SR, occ PVC's unchanged from prior to initiation of drug.  On the day of discharge,he was examined by Dr. Ladona Ridgel who considered him stable for discharge to home.  Follow-up has been arranged with the AFib clinic in 1 week for visit/labs, ekg and with Dr Ladona Ridgel in 4  weeks, then to resume f/u with Dr. [name].   Dofetilide Prior auth approval # [id number] (08/06/16 - 08/07/2018)  Physical Exam: Vitals:   08/05/16 2059 08/06/16 0500 08/06/16 0815 08/06/16 0928  BP: 104/69 110/74 118/82 111/72  Pulse: (!) 57 60  60  Resp: 16 16    Temp: 98.3 F (36.8 C) 98.7 F (37.1 C)    TempSrc: Oral Oral    SpO2: 98% 98% 100%   Weight:  266 lb 12.8 oz (121 kg)    Height:        Labs:   Lab Results  Component Value Date   WBC 5.9 05/11/2016   HGB 14.4 05/11/2016   HCT 43.1 05/11/2016   MCV 96.4 05/11/2016   PLT 268 05/11/2016     Recent Labs Lab 08/06/16 0310  NA 135  K 4.0  CL 103  CO2 24  BUN 17  CREATININE 1.03  CALCIUM 8.7*  GLUCOSE 110*     Discharge Medications:  Allergies as of 08/06/2016   No Known Allergies     Medication List    TAKE these medications   BEN GAY 1.4 % Ptch Generic drug:  Menthol (Topical Analgesic) Apply 1 patch topically daily as needed (for pain).   carvedilol 12.5 MG tablet Commonly known as:  COREG Take 2 tablets (25mg ) in the morning and 1 tablet (12.5mg ) in the evening. What changed:  how much to take  how to take this  when to take this  additional instructions   dofetilide 500 MCG capsule Commonly known as:  TIKOSYN Take 1 capsule (500 mcg total)  by mouth 2 (two) times daily.   hydrALAZINE 25 MG tablet Commonly known as:  APRESOLINE TAKE ONE-HALF TABLET BY MOUTH THREE TIMES DAILY What changed:  how much to take  how to take this  when to take this  additional instructions   lisinopril 5 MG tablet Commonly known as:  PRINIVIL,ZESTRIL TAKE ONE TABLET BY MOUTH ONCE DAILY   pravastatin 20 MG tablet Commonly known as:  PRAVACHOL Take 20 mg by mouth daily.   warfarin 2 MG tablet Commonly known as:  COUMADIN TAKE AS DIRECTED BY  COUMADIN  CLINIC What changed:  See the new instructions. Notes to patient:  Continue your current regime unchanged as per the coumadin clinic  directions, no changes are being made at this time.       Disposition:  Home Discharge Instructions    Diet - low sodium heart healthy    Complete by:  As directed    Increase activity slowly    Complete by:  As directed      Follow-up Information    [facility] Follow up on 08/13/2016.   Specialty:  Cardiology Why:  10:00AM Contact information: [address] [id number] mc [place] [address] [phone]       Lewayne Bunting, MD Follow up on 09/09/2016.   Specialty:  Cardiology Why:  10:15AM Contact information: [address] [place] [address] [phone]        [facility] Follow up on 08/12/2016.   Specialty:  Cardiology Why:  7:45AM, coumadin clinic  Contact information: [address] [place] [address] [phone]       [name], MD Follow up on 10/19/2016.   Specialty:  Cardiology Why:  8:35AM Contact information: [address] [place] [address] [phone]           Duration of Discharge Encounter: Greater than 30 minutes including physician time.  Norma Fredrickson, PA-C 08/06/2016 12:10 PM   EP Attending  Patient seen and examined. Agree with above. The patient is stable for DC home. Usual followup with BMP and ECG in a week.   Leonia Reeves.D.

## 2016-08-06 LAB — BASIC METABOLIC PANEL
Anion gap: 8 (ref 5–15)
BUN: 17 mg/dL (ref 6–20)
CHLORIDE: 103 mmol/L (ref 101–111)
CO2: 24 mmol/L (ref 22–32)
Calcium: 8.7 mg/dL — ABNORMAL LOW (ref 8.9–10.3)
Creatinine, Ser: 1.03 mg/dL (ref 0.61–1.24)
GFR calc non Af Amer: >60 mL/min (ref >60)
Glucose, Bld: 110 mg/dL — ABNORMAL HIGH (ref 65–99)
POTASSIUM: 4 mmol/L (ref 3.5–5.1)
SODIUM: 135 mmol/L (ref 135–145)

## 2016-08-06 LAB — PROTIME-INR
INR: 2.3
Prothrombin Time: 25.7 seconds — ABNORMAL HIGH (ref 11.4–15.2)

## 2016-08-06 LAB — MAGNESIUM: Magnesium: 2 mg/dL (ref 1.7–2.4)

## 2016-08-06 MED ORDER — WARFARIN SODIUM 2 MG PO TABS: 2.0000 mg | ORAL_TABLET | Freq: Once | ORAL | Status: DC

## 2016-08-06 MED ORDER — DOFETILIDE 500 MCG PO CAPS: 500.0000 ug | ORAL_CAPSULE | Freq: Two times a day (BID) | ORAL | 3 refills | Status: DC

## 2016-08-06 NOTE — Plan of Care (Signed)
Problem: Education: Goal: Understanding of medication regimen will improve Outcome: Completed/Met Date Met: 08/06/16 Discharge teaching done, patient verbalizes understanding  Problem: Health Behavior/Discharge Planning: Goal: Ability to safely manage health-related needs after discharge will improve Outcome: Completed/Met Date Met: 08/06/16 Patient verbalizes understanding of medications being sent home

## 2016-08-06 NOTE — Progress Notes (Signed)
ANTICOAGULATION CONSULT NOTE - Follow Up Consult  Pharmacy Consult for Coumadin Indication: atrial fibrillation  No Known Allergies  Patient Measurements: Height: 6\' 4"  (193 cm) Weight: 266 lb 12.8 oz (121 kg) IBW/kg (Calculated) : 86.8  Vital Signs: Temp: 98.7 F (37.1 C) (04/19 0500) Temp Source: Oral (04/19 0500) BP: 111/72 (04/19 0928) Pulse Rate: 60 (04/19 0928)  Labs:  Recent Labs  08/04/16 0342 08/05/16 0416 08/06/16 0310  LABPROT 22.5* 24.7* 25.7*  INR 1.95 2.18 2.30  CREATININE 1.12 1.13 1.03    Estimated Creatinine Clearance: 101.6 mL/min (by C-G formula based on SCr of 1.03 mg/dL).   Medications:  Scheduled:  . carvedilol  12.5 mg Oral Q2200  . carvedilol  25 mg Oral q morning - 10a  . dofetilide  500 mcg Oral BID  . hydrALAZINE  12.5 mg Oral Q8H  . lisinopril  5 mg Oral Daily  . pravastatin  20 mg Oral Daily  . sodium chloride flush  3 mL Intravenous Q12H  . sodium chloride flush  3 mL Intravenous Q12H  . Warfarin - Pharmacist Dosing Inpatient   Does not apply q1800    Assessment: 66yo male on Coumadin for AFib, s/p cardioversion this AM.  INR therapeutic.  No bleeding noted.  Expected d/c home today.   Goal of Therapy:  INR 2-3 Monitor platelets by anticoagulation protocol: Yes   Plan:  Coumadin 2mg  po today as per home dosing regimen Watch for s/s of bleeding  Marisue Humble, PharmD Clinical Pharmacist [facility]

## 2016-08-06 NOTE — Progress Notes (Signed)
Progress Note  Patient Name: Ruben [name] Date of Encounter: 08/06/2016  Primary Cardiologist: Dr. [name]  Subjective   No chest pain, palpitations or sob  Inpatient Medications    Scheduled Meds: . carvedilol  12.5 mg Oral Q2200  . carvedilol  25 mg Oral q morning - 10a  . dofetilide  500 mcg Oral BID  . hydrALAZINE  12.5 mg Oral Q8H  . lisinopril  5 mg Oral Daily  . pravastatin  20 mg Oral Daily  . sodium chloride flush  3 mL Intravenous Q12H  . sodium chloride flush  3 mL Intravenous Q12H  . Warfarin - Pharmacist Dosing Inpatient   Does not apply q1800   Continuous Infusions: . sodium chloride Stopped (08/05/16 1235)  . sodium chloride     PRN Meds: sodium chloride, acetaminophen, sodium chloride flush, sodium chloride flush   Vital Signs    Vitals:   08/05/16 1300 08/05/16 2059 08/06/16 0500 08/06/16 0815  BP: 118/83 104/69 110/74 118/82  Pulse: (!) 51 (!) 57 60   Resp: 14 16 16    Temp: 97.4 F (36.3 C) 98.3 F (36.8 C) 98.7 F (37.1 C)   TempSrc: Oral Oral Oral   SpO2: 97% 98% 98%   Weight:   266 lb 12.8 oz (121 kg)   Height:        Intake/Output Summary (Last 24 hours) at 08/06/16 0829 Last data filed at 08/05/16 2015  Gross per 24 hour  Intake              480 ml  Output                0 ml  Net              480 ml   Filed Weights   08/03/16 1115 08/05/16 0424 08/06/16 0500  Weight: 266 lb 6.4 oz (120.8 kg) 263 lb (119.3 kg) 266 lb 12.8 oz (121 kg)    Telemetry    NSR, with PVC's and PAC's - Personally Reviewed  ECG    NSR with stable QT - Personally Reviewed  Physical Exam   GEN: No acute distress.   Neck: No JVD Cardiac: RRR, no murmurs, rubs, or gallops.  Respiratory: Clear to auscultation bilaterally. GI: Soft, nontender, non-distended  MS: No edema; No deformity. Neuro:  Nonfocal  Psych: Normal affect   Labs    Chemistry Recent Labs Lab 08/04/16 0342 08/05/16 0416 08/06/16 0310  NA 138 137 135  K 4.1 3.9 4.0    CL 106 106 103  CO2 25 25 24   GLUCOSE 110* 109* 110*  BUN 20 16 17   CREATININE 1.12 1.13 1.03  CALCIUM 8.7* 8.8* 8.7*  GFRNONAA >60 >60 >60  GFRAA >60 >60 >60  ANIONGAP 7 6 8      HematologyNo results for input(s): WBC, RBC, HGB, HCT, MCV, MCH, MCHC, RDW, PLT in the last 168 hours.  Cardiac EnzymesNo results for input(s): TROPONINI in the last 168 hours. No results for input(s): TROPIPOC in the last 168 hours.   BNPNo results for input(s): BNP, PROBNP in the last 168 hours.   DDimer No results for input(s): DDIMER in the last 168 hours.   Radiology    No results found.  Cardiac Studies   DCCV - successful  Patient Profile     66 y.o. male admitted with atrial fib for dofetilide initiation  Assessment & Plan    1. Atrial fib - he is s/p DCCV and is  maintaining NSR on dofetilide. After last dose this a.m, ok for DC with usual followup.  Ruben [name],M.D.  Signed, Ruben Bunting, MD  08/06/2016, 8:29 AM  Patient ID: Ruben [name], male   DOB: 09/02/1950, 66 y.o.   MRN: [id number]

## 2016-08-06 NOTE — Progress Notes (Signed)
Pt being discharged per MD order. All discharge instructions given to patient and patient verbalizes understanding.

## 2016-08-07 ENCOUNTER — Telehealth: Payer: Self-pay

## 2016-08-07 NOTE — Telephone Encounter (Signed)
We received a fax from [facility] stating that the pts Dofetilide PA has been approved through [facility] [employer]. Approval good from 08/06/2016 until 08/07/2018.

## 2016-08-12 ENCOUNTER — Ambulatory Visit (INDEPENDENT_AMBULATORY_CARE_PROVIDER_SITE_OTHER): Payer: BC Managed Care – PPO | Admitting: *Deleted

## 2016-08-12 ENCOUNTER — Ambulatory Visit: Payer: BC Managed Care – PPO | Admitting: Cardiovascular Disease

## 2016-08-12 DIAGNOSIS — Z5181 Encounter for therapeutic drug level monitoring: Secondary | ICD-10-CM | POA: Diagnosis not present

## 2016-08-12 DIAGNOSIS — I4891 Unspecified atrial fibrillation: Secondary | ICD-10-CM

## 2016-08-12 LAB — POCT INR: INR: 2.5

## 2016-08-13 ENCOUNTER — Encounter (HOSPITAL_COMMUNITY): Payer: Self-pay | Admitting: Nurse Practitioner

## 2016-08-13 ENCOUNTER — Ambulatory Visit (HOSPITAL_COMMUNITY)
Admission: RE | Admit: 2016-08-13 | Discharge: 2016-08-13 | Disposition: A | Discharge status: Home / Self Care | Payer: BC Managed Care – PPO | Source: Ambulatory Visit | Attending: Nurse Practitioner | Admitting: Nurse Practitioner

## 2016-08-13 VITALS — BP 114/72 | HR 49 | Ht 76.0 in | Wt 271.2 lb

## 2016-08-13 DIAGNOSIS — Z7901 Long term (current) use of anticoagulants: Secondary | ICD-10-CM | POA: Diagnosis not present

## 2016-08-13 DIAGNOSIS — I4819 Other persistent atrial fibrillation: Secondary | ICD-10-CM

## 2016-08-13 DIAGNOSIS — Z79899 Other long term (current) drug therapy: Secondary | ICD-10-CM | POA: Diagnosis not present

## 2016-08-13 DIAGNOSIS — I429 Cardiomyopathy, unspecified: Secondary | ICD-10-CM | POA: Diagnosis not present

## 2016-08-13 DIAGNOSIS — G4733 Obstructive sleep apnea (adult) (pediatric): Secondary | ICD-10-CM | POA: Insufficient documentation

## 2016-08-13 DIAGNOSIS — I481 Persistent atrial fibrillation: Secondary | ICD-10-CM | POA: Diagnosis not present

## 2016-08-13 DIAGNOSIS — Z8042 Family history of malignant neoplasm of prostate: Secondary | ICD-10-CM | POA: Diagnosis not present

## 2016-08-13 DIAGNOSIS — Z823 Family history of stroke: Secondary | ICD-10-CM | POA: Diagnosis not present

## 2016-08-13 DIAGNOSIS — Z9889 Other specified postprocedural states: Secondary | ICD-10-CM | POA: Insufficient documentation

## 2016-08-13 DIAGNOSIS — Z8249 Family history of ischemic heart disease and other diseases of the circulatory system: Secondary | ICD-10-CM | POA: Insufficient documentation

## 2016-08-13 DIAGNOSIS — I4891 Unspecified atrial fibrillation: Secondary | ICD-10-CM | POA: Diagnosis not present

## 2016-08-13 LAB — BASIC METABOLIC PANEL
Anion gap: 7 (ref 5–15)
BUN: 15 mg/dL (ref 6–20)
CHLORIDE: 109 mmol/L (ref 101–111)
CO2: 23 mmol/L (ref 22–32)
CREATININE: 1.05 mg/dL (ref 0.61–1.24)
Calcium: 9.1 mg/dL (ref 8.9–10.3)
Glucose, Bld: 108 mg/dL — ABNORMAL HIGH (ref 65–99)
POTASSIUM: 3.9 mmol/L (ref 3.5–5.1)
SODIUM: 139 mmol/L (ref 135–145)

## 2016-08-13 LAB — MAGNESIUM: MAGNESIUM: 1.8 mg/dL (ref 1.7–2.4)

## 2016-08-13 MED ORDER — CARVEDILOL 12.5 MG PO TABS: 12.5000 mg | ORAL_TABLET | Freq: Two times a day (BID) | ORAL | 3 refills | Status: DC

## 2016-08-13 NOTE — Progress Notes (Signed)
Primary Care Physician: Aura Dials, MD Referring Physician: Dr. Melburn Popper EP: Ruben [name] is a 66 y.o. male with a h/o NICM and afib. He was hospitalized in February 2012 and was critically ill with severe systolic left ventricular dysfunction and atrial fibrillation. He was successfully cardioverted back to sinus rhythm in May 2012. He did well until August 2014 when he went back into atrial fibrillation after  Amiodarone reduced to 100 mg daily. His dose was increased and he was cardioverted him successfully on 03/02/13. Since then he has maintained normal sinus rhythm. His echocardiogram in 01/14/13 showed that his ejection fraction had improved to 50-55%.  Due to concerns of long term complications of amiodarone, the dose of drug was decreased, but with subsequent return of atrial fibrillation. He  was in the afib clinic 2/2 to discuss options. He is fairly comfortable in afib, and is rate controlled at rest, but does notice that his heart rate will increase to 120 bpm with exertion.  He does not drink alcohol, no excessive caffeine, no tobacco. He is obese, does not exercise and admits that he eats a lot of junk food. He does have OSA and uses CPAP.  He returns after off amiodarone x one month. He is wanted admission for tikosyn and will draw an amiodarone level today. He will also need 4 consecutive weeks of therapeutic INR's. He was sub therapeutic last week. Followed at the coumadin clinic [address]  He feels ok in rate controlled afib but does see that his energy is not as good. Important to restore SR with h/o   NICM.  F/u in afib clinic 4/16 and he is ready to be admitted for tikosyn. He has had weekly INR's x 5 weeks. Amiodarone level two weeks ago at less than 0.3.Precautions  with tikosyn use reviewed with pt. He can afford drug.  F/u afib clinic,from tikosyn loading, 4/16-1/19.Marland Kitchen He did well with stable qtc, remains stable today at 446 ms. He is slow  today at 49 bpm but not symptomatic.He feels better in SR with more energy.  Today, he denies symptoms of  chest pain, orthopnea, PND, lower extremity edema, dizziness, presyncope, syncope, or neurologic sequela. Positive for exertional dyspnea. The patient is tolerating medications without difficulties and is otherwise without complaint today.   Past Medical History:  Diagnosis Date  . Atrial fibrillation (HCC)   . Cough   . Non-ischemic cardiomyopathy (HCC)       . OSA (obstructive sleep apnea)    Past Surgical History:  Procedure Laterality Date  . CARDIOVERSION N/A 03/02/2013   Procedure: CARDIOVERSION;  Surgeon: [name], MD;  Location: [facility] ENDOSCOPY;  Service: Cardiovascular;  Laterality: N/A;  . CARDIOVERSION N/A 08/05/2016   Procedure: CARDIOVERSION;  Surgeon: Chilton Si, MD;  Location: [facility] ENDOSCOPY;  Service: Cardiovascular;  Laterality: N/A;  . FOOT FRACTURE SURGERY    . PILONIDAL CYST EXCISION      Current Outpatient Prescriptions  Medication Sig Dispense Refill  . carvedilol (COREG) 12.5 MG tablet Take 1 tablet (12.5 mg total) by mouth 2 (two) times daily with a meal. 90 tablet 3  . dofetilide (TIKOSYN) 500 MCG capsule Take 1 capsule (500 mcg total) by mouth 2 (two) times daily. 60 capsule 3  . hydrALAZINE (APRESOLINE) 25 MG tablet TAKE ONE-HALF TABLET BY MOUTH THREE TIMES DAILY (Patient taking differently: Take 12.5 mg by mouth 3 (three) times daily. ) 135 tablet 3  . lisinopril (PRINIVIL,ZESTRIL) 5 MG tablet TAKE  ONE TABLET BY MOUTH ONCE DAILY 30 tablet 5  . Menthol, Topical Analgesic, (BEN GAY) 1.4 % PTCH Apply 1 patch topically daily as needed (for pain).    . pravastatin (PRAVACHOL) 20 MG tablet Take 20 mg by mouth daily.    Marland Kitchen warfarin (COUMADIN) 2 MG tablet TAKE AS DIRECTED BY  COUMADIN  CLINIC (Patient taking differently: Take 4 mg by mouth Sunday, Monday, Wednesday and Friday. Take 2 mg by mouth daily on all other days.) 170 tablet 1   No current  facility-administered medications for this encounter.     No Known Allergies  Social History   Social History  . Marital status: Married    Spouse name: N/A  . Number of children: N/A  . Years of education: N/A   Occupational History  . Not on file.   Social History Main Topics  . Smoking status: Never Smoker  . Smokeless tobacco: Never Used  . Alcohol use No  . Drug use: No  . Sexual activity: Yes    Birth control/ protection: Other-see comments   Other Topics Concern  . Not on file   Social History Narrative  . No narrative on file    Family History  Problem Relation Age of Onset  . Cancer Mother   . Stroke Mother   . Hypertension Mother   . Memory loss Mother   . Prostate cancer Father     ROS- All systems are reviewed and negative except as per the HPI above  Physical Exam: Vitals:   08/13/16 1003  BP: 114/72  Pulse: (!) 49  Weight: 271 lb 3.2 oz (123 kg)  Height: 6\' 4"  (1.93 m)   Wt Readings from Last 3 Encounters:  08/13/16 271 lb 3.2 oz (123 kg)  08/06/16 266 lb 12.8 oz (121 kg)  08/03/16 268 lb 12.8 oz (121.9 kg)    Labs: Lab Results  Component Value Date   NA 135 08/06/2016   K 4.0 08/06/2016   CL 103 08/06/2016   CO2 24 08/06/2016   GLUCOSE 110 (H) 08/06/2016   BUN 17 08/06/2016   CREATININE 1.03 08/06/2016   CALCIUM 8.7 (L) 08/06/2016   PHOS 3.9 05/09/2010   MG 2.0 08/06/2016   Lab Results  Component Value Date   INR 2.5 08/12/2016   Lab Results  Component Value Date   CHOL 175 04/28/2016   HDL 43 04/28/2016   LDLCALC 101 (H) 04/28/2016   TRIG 154 (H) 04/28/2016     GEN- The patient is well appearing, alert and oriented x 3 today.   Head- normocephalic, atraumatic Eyes-  Sclera clear, conjunctiva pink Ears- hearing intact Oropharynx- clear Neck- supple, no JVP Lymph- no cervical lymphadenopathy Lungs- Clear to ausculation bilaterally, normal work of breathing Heart-  regular rate and rhythm, no murmurs, rubs or  gallops, PMI not laterally displaced GI- soft, NT, ND, + BS Extremities- no clubbing, cyanosis, or edema MS- no significant deformity or atrophy Skin- no rash or lesion Psych- euthymic mood, full affect Neuro- strength and sensation are intact  EKG- afib at 49 bpm,pr int 196 ms, qrs int 96 ms, qtc 446 ms(stable) Echo-2014-Study Conclusions  - Left ventricle: The cavity size was normal. There was mild focal basal hypertrophy of the septum. Systolic function was normal. The estimated ejection fraction was in the range of 50% to 55%. Wall motion was normal; there were no regional wall motion abnormalities. - Ascending aorta: The ascending aorta was mildly dilated. - Mitral valve: Mild prolapse,  involving the anterior leaflet and the posterior leaflet. Mild regurgitation. - Left atrium: The atrium was mildly dilated. - Right atrium: The atrium was mildly dilated. INR- 1.7, 2/27     Assessment and Plan: 1. Afib Had been on amiodarone for six years and is now off drug for concern of long term side effects  Tikosyn loading successful and pt in SR Precautions reviewed Decrease coreg to 12.5 bid for HR of 49 today Continue warfarin bmet/mag today  2. Lifestyle issues Encouraged healthier diet for weight loss Continue to use CPAP Regular exercise encouraged  2. H/o NICM Echo updated 2/12- EF 55-60%  f/u with Dr. Ladona Ridgel and Dr. [name] as shcheduled  Ruben [name]. [name] [address] [place], [place] [address] [phone]

## 2016-08-13 NOTE — Patient Instructions (Signed)
Your physician has recommended you make the following change in your medication:  1)Decrease carvedilol to 12.5mg  twice a day

## 2016-08-21 ENCOUNTER — Ambulatory Visit (HOSPITAL_COMMUNITY)
Admission: RE | Admit: 2016-08-21 | Discharge: 2016-08-21 | Disposition: A | Discharge status: Home / Self Care | Payer: BC Managed Care – PPO | Source: Ambulatory Visit | Attending: Nurse Practitioner | Admitting: Nurse Practitioner

## 2016-08-21 DIAGNOSIS — I481 Persistent atrial fibrillation: Secondary | ICD-10-CM | POA: Diagnosis present

## 2016-08-21 LAB — BASIC METABOLIC PANEL
Anion gap: 9 (ref 5–15)
BUN: 13 mg/dL (ref 6–20)
CALCIUM: 8.8 mg/dL — AB (ref 8.9–10.3)
CO2: 25 mmol/L (ref 22–32)
CREATININE: 1.07 mg/dL (ref 0.61–1.24)
Chloride: 104 mmol/L (ref 101–111)
GFR calc non Af Amer: >60 mL/min (ref >60)
GLUCOSE: 114 mg/dL — AB (ref 65–99)
Potassium: 4.2 mmol/L (ref 3.5–5.1)
Sodium: 138 mmol/L (ref 135–145)

## 2016-08-21 LAB — MAGNESIUM: Magnesium: 1.9 mg/dL (ref 1.7–2.4)

## 2016-08-21 MED ORDER — CARVEDILOL 12.5 MG PO TABS: 6.2500 mg | ORAL_TABLET | Freq: Two times a day (BID) | ORAL | 3 refills | Status: DC

## 2016-08-21 NOTE — Patient Instructions (Signed)
Your physician has recommended you make the following change in your medication:  1)Decrease Coreg to 6.25mg  twice a day (1/2 tablet of the 12.5mg )

## 2016-08-24 ENCOUNTER — Encounter: Payer: Self-pay | Admitting: Internal Medicine

## 2016-09-09 ENCOUNTER — Encounter: Payer: Self-pay | Admitting: Internal Medicine

## 2016-09-09 ENCOUNTER — Ambulatory Visit (INDEPENDENT_AMBULATORY_CARE_PROVIDER_SITE_OTHER): Payer: BC Managed Care – PPO | Admitting: Internal Medicine

## 2016-09-09 ENCOUNTER — Ambulatory Visit (INDEPENDENT_AMBULATORY_CARE_PROVIDER_SITE_OTHER): Payer: BC Managed Care – PPO | Admitting: *Deleted

## 2016-09-09 VITALS — BP 102/62 | HR 48 | Ht 76.0 in | Wt 269.0 lb

## 2016-09-09 DIAGNOSIS — I4891 Unspecified atrial fibrillation: Secondary | ICD-10-CM | POA: Diagnosis not present

## 2016-09-09 DIAGNOSIS — I4819 Other persistent atrial fibrillation: Secondary | ICD-10-CM

## 2016-09-09 DIAGNOSIS — Z5181 Encounter for therapeutic drug level monitoring: Secondary | ICD-10-CM

## 2016-09-09 DIAGNOSIS — I481 Persistent atrial fibrillation: Secondary | ICD-10-CM

## 2016-09-09 LAB — POCT INR: INR: 1.8

## 2016-09-09 NOTE — Progress Notes (Signed)
HPI Mr. Reece Agar returns today for followup of his atrial fib. He is a pleasant 66 yo man with persistent atrial fib who came off of amiodarone and was admitted and started on dofetilide. In the interim, he has done well. He is still working. He denies palpitations. He does admit to being late in taking his dofetilide.  No Known Allergies   Current Outpatient Prescriptions  Medication Sig Dispense Refill  . carvedilol (COREG) 12.5 MG tablet Take 0.5 tablets (6.25 mg total) by mouth 2 (two) times daily with a meal. 90 tablet 3  . dofetilide (TIKOSYN) 500 MCG capsule Take 1 capsule (500 mcg total) by mouth 2 (two) times daily. 60 capsule 3  . hydrALAZINE (APRESOLINE) 25 MG tablet TAKE ONE-HALF TABLET BY MOUTH THREE TIMES DAILY (Patient taking differently: Take 12.5 mg by mouth 3 (three) times daily. ) 135 tablet 3  . lisinopril (PRINIVIL,ZESTRIL) 5 MG tablet TAKE ONE TABLET BY MOUTH ONCE DAILY 30 tablet 5  . Menthol, Topical Analgesic, (BEN GAY) 1.4 % PTCH Apply 1 patch topically daily as needed (for pain).    . pravastatin (PRAVACHOL) 20 MG tablet Take 20 mg by mouth daily.    Marland Kitchen warfarin (COUMADIN) 2 MG tablet TAKE AS DIRECTED BY  COUMADIN  CLINIC (Patient taking differently: Take 4 mg by mouth Sunday, Monday, Wednesday and Friday. Take 2 mg by mouth daily on all other days.) 170 tablet 1   No current facility-administered medications for this visit.      Past Medical History:  Diagnosis Date  . Atrial fibrillation (HCC)   . Cough   . Non-ischemic cardiomyopathy (HCC)       . OSA (obstructive sleep apnea)     ROS:   All systems reviewed and negative except as noted in the HPI.   Past Surgical History:  Procedure Laterality Date  . CARDIOVERSION N/A 03/02/2013   Procedure: CARDIOVERSION;  Surgeon: [name], MD;  Location[facility] ENDOSCOPY;  Service: Cardiovascular;  Laterality: N/A;  . CARDIOVERSION N/A 08/05/2016   Procedure: CARDIOVERSION;  Surgeon: Chilton Si, MD;   Location: [facility] ENDOSCOPY;  Service: Cardiovascular;  Laterality: N/A;  . FOOT FRACTURE SURGERY    . PILONIDAL CYST EXCISION       Family History  Problem Relation Age of Onset  . Cancer Mother   . Stroke Mother   . Hypertension Mother   . Memory loss Mother   . Prostate cancer Father      Social History   Social History  . Marital status: Married    Spouse name: N/A  . Number of children: N/A  . Years of education: N/A   Occupational History  . Not on file.   Social History Main Topics  . Smoking status: Never Smoker  . Smokeless tobacco: Never Used  . Alcohol use No  . Drug use: No  . Sexual activity: Yes    Birth control/ protection: Other-see comments   Other Topics Concern  . Not on file   Social History Narrative  . No narrative on file     BP 102/62   Pulse (!) 48   Ht 6\' 4"  (1.93 m)   Wt 269 lb (122 kg)   SpO2 98%   BMI 32.74 kg/m   Physical Exam:  Well appearing 66 yo man, NAD HEENT: Unremarkable Neck:  No JVD, no thyromegally Lymphatics:  No adenopathy Back:  No CVA tenderness Lungs:  Clear with no wheezes HEART:  Regular rate rhythm,  no murmurs, no rubs, no clicks Abd:  soft, positive bowel sounds, no organomegally, no rebound, no guarding Ext:  2 plus pulses, no edema, no cyanosis, no clubbing Skin:  No rashes no nodules Neuro:  CN II through XII intact, motor grossly intact  EKG - sinus brady. QTC is 440  Assess/Plan: 1. PAF - he is maintaining NSR. He will continue his dofetilide. 2. HTN - his blood pressure has been well controlled. No change in meds. 3. Sinus node dysfunction - his HR is in the high 40's today. I have asked the patient to call if he develops symptom and these have been reviewed. He is currently asymptomatic.  Leonia Reeves.D.

## 2016-09-09 NOTE — Patient Instructions (Signed)
Medication Instructions:  Your physician recommends that you continue on your current medications as directed. Please refer to the Current Medication list given to you today.   Labwork: None Ordered   Testing/Procedures:  None Ordered    Follow-Up: Follow-up with Dr. Ladona Ridgel as needed.  Follow-up with Dr. [name] as scheduled.  Any Other Special Instructions Will Be Listed Below (If Applicable).     If you need a refill on your cardiac medications before your next appointment, please call your pharmacy.

## 2016-09-19 ENCOUNTER — Other Ambulatory Visit: Payer: Self-pay | Admitting: Cardiovascular Disease

## 2016-09-28 ENCOUNTER — Ambulatory Visit (INDEPENDENT_AMBULATORY_CARE_PROVIDER_SITE_OTHER): Payer: BC Managed Care – PPO

## 2016-09-28 ENCOUNTER — Encounter (INDEPENDENT_AMBULATORY_CARE_PROVIDER_SITE_OTHER): Payer: Self-pay

## 2016-09-28 DIAGNOSIS — I4891 Unspecified atrial fibrillation: Secondary | ICD-10-CM | POA: Diagnosis not present

## 2016-09-28 DIAGNOSIS — Z5181 Encounter for therapeutic drug level monitoring: Secondary | ICD-10-CM | POA: Diagnosis not present

## 2016-09-28 LAB — POCT INR: INR: 1.7

## 2016-10-10 ENCOUNTER — Other Ambulatory Visit: Payer: Self-pay | Admitting: Cardiovascular Disease

## 2016-10-12 ENCOUNTER — Ambulatory Visit (INDEPENDENT_AMBULATORY_CARE_PROVIDER_SITE_OTHER): Payer: BC Managed Care – PPO | Admitting: Pharmacist

## 2016-10-12 DIAGNOSIS — Z5181 Encounter for therapeutic drug level monitoring: Secondary | ICD-10-CM | POA: Diagnosis not present

## 2016-10-12 DIAGNOSIS — I4891 Unspecified atrial fibrillation: Secondary | ICD-10-CM | POA: Diagnosis not present

## 2016-10-12 LAB — POCT INR: INR: 2.4

## 2016-10-19 ENCOUNTER — Ambulatory Visit: Payer: BC Managed Care – PPO | Admitting: Cardiovascular Disease

## 2016-10-30 NOTE — Anesthesia Postprocedure Evaluation (Signed)
Anesthesia Post Note  Patient: Ruben [name] [facility]  Procedure(s) Performed: Procedure(s) (LRB): CARDIOVERSION (N/A)     Anesthesia Post Evaluation  Last Vitals:  Vitals:   08/06/16 1403 08/06/16 1406  BP: 104/68 104/68  Pulse: (!) 57   Resp: 20   Temp: 36.9 C     Last Pain:  Vitals:   08/06/16 1403  TempSrc: Oral  PainSc: 0-No pain                 [name]

## 2016-10-30 NOTE — Addendum Note (Signed)
Addendum  created 10/30/16 1016 by [name], MD   Sign clinical note

## 2016-11-02 ENCOUNTER — Ambulatory Visit (INDEPENDENT_AMBULATORY_CARE_PROVIDER_SITE_OTHER): Payer: BC Managed Care – PPO | Admitting: *Deleted

## 2016-11-02 DIAGNOSIS — I4891 Unspecified atrial fibrillation: Secondary | ICD-10-CM | POA: Diagnosis not present

## 2016-11-02 DIAGNOSIS — Z5181 Encounter for therapeutic drug level monitoring: Secondary | ICD-10-CM | POA: Diagnosis not present

## 2016-11-02 LAB — POCT INR: INR: 2.5

## 2016-11-30 ENCOUNTER — Encounter (INDEPENDENT_AMBULATORY_CARE_PROVIDER_SITE_OTHER): Payer: Self-pay

## 2016-11-30 ENCOUNTER — Ambulatory Visit (INDEPENDENT_AMBULATORY_CARE_PROVIDER_SITE_OTHER): Payer: BC Managed Care – PPO

## 2016-11-30 DIAGNOSIS — I4891 Unspecified atrial fibrillation: Secondary | ICD-10-CM

## 2016-11-30 DIAGNOSIS — Z5181 Encounter for therapeutic drug level monitoring: Secondary | ICD-10-CM | POA: Diagnosis not present

## 2016-11-30 LAB — POCT INR: INR: 2.4

## 2016-12-02 ENCOUNTER — Other Ambulatory Visit: Payer: Self-pay | Admitting: Physician Assistant

## 2016-12-04 ENCOUNTER — Telehealth: Payer: Self-pay | Admitting: Cardiovascular Disease

## 2016-12-04 NOTE — Telephone Encounter (Signed)
Left detailed message for patient that fasting lab appointment is scheduled for 8/31. I advised him to call back to reschedule or that he may complete lab work when he comes for his appointment with Dr. [name] on 9/5. I advised him to call back with questions or concerns.

## 2016-12-04 NOTE — Telephone Encounter (Signed)
New message      Pt has an appt on sept 5.  Calling to see if he needs to come in a few days early for labs?  Ok to leave msg on vm.

## 2016-12-17 ENCOUNTER — Other Ambulatory Visit: Payer: BC Managed Care – PPO | Admitting: *Deleted

## 2016-12-17 DIAGNOSIS — E782 Mixed hyperlipidemia: Secondary | ICD-10-CM

## 2016-12-17 DIAGNOSIS — I5022 Chronic systolic (congestive) heart failure: Secondary | ICD-10-CM

## 2016-12-17 DIAGNOSIS — I480 Paroxysmal atrial fibrillation: Secondary | ICD-10-CM

## 2016-12-17 LAB — COMPREHENSIVE METABOLIC PANEL
A/G RATIO: 1.5 (ref 1.2–2.2)
ALK PHOS: 93 IU/L (ref 39–117)
ALT: 13 IU/L (ref 0–44)
AST: 15 IU/L (ref 0–40)
Albumin: 4 g/dL (ref 3.6–4.8)
BUN/Creatinine Ratio: 14 (ref 10–24)
BUN: 15 mg/dL (ref 8–27)
Bilirubin Total: 0.5 mg/dL (ref 0.0–1.2)
CO2: 21 mmol/L (ref 20–29)
Calcium: 8.9 mg/dL (ref 8.6–10.2)
Chloride: 103 mmol/L (ref 96–106)
Creatinine, Ser: 1.11 mg/dL (ref 0.76–1.27)
GFR calc Af Amer: 80 mL/min/{1.73_m2} (ref >59)
GFR calc non Af Amer: 69 mL/min/{1.73_m2} (ref >59)
GLOBULIN, TOTAL: 2.6 g/dL (ref 1.5–4.5)
Glucose: 99 mg/dL (ref 65–99)
POTASSIUM: 4.4 mmol/L (ref 3.5–5.2)
SODIUM: 140 mmol/L (ref 134–144)
Total Protein: 6.6 g/dL (ref 6.0–8.5)

## 2016-12-17 LAB — LIPID PANEL
CHOL/HDL RATIO: 4 ratio (ref 0.0–5.0)
CHOLESTEROL TOTAL: 152 mg/dL (ref 100–199)
HDL: 38 mg/dL — ABNORMAL LOW (ref >39)
LDL CALC: 79 mg/dL (ref 0–99)
TRIGLYCERIDES: 175 mg/dL — AB (ref 0–149)
VLDL CHOLESTEROL CAL: 35 mg/dL (ref 5–40)

## 2016-12-17 LAB — TSH: TSH: 2.14 u[IU]/mL (ref 0.450–4.500)

## 2016-12-18 ENCOUNTER — Other Ambulatory Visit: Payer: BC Managed Care – PPO

## 2016-12-23 ENCOUNTER — Ambulatory Visit (INDEPENDENT_AMBULATORY_CARE_PROVIDER_SITE_OTHER): Payer: BC Managed Care – PPO | Admitting: Cardiovascular Disease

## 2016-12-23 ENCOUNTER — Encounter: Payer: Self-pay | Admitting: Cardiovascular Disease

## 2016-12-23 VITALS — BP 110/60 | HR 56 | Ht 76.0 in | Wt 263.2 lb

## 2016-12-23 DIAGNOSIS — I480 Paroxysmal atrial fibrillation: Secondary | ICD-10-CM | POA: Diagnosis not present

## 2016-12-23 DIAGNOSIS — Z79899 Other long term (current) drug therapy: Secondary | ICD-10-CM | POA: Diagnosis not present

## 2016-12-23 DIAGNOSIS — I5022 Chronic systolic (congestive) heart failure: Secondary | ICD-10-CM | POA: Diagnosis not present

## 2016-12-23 DIAGNOSIS — E782 Mixed hyperlipidemia: Secondary | ICD-10-CM

## 2016-12-23 LAB — MAGNESIUM: MAGNESIUM: 1.9 mg/dL (ref 1.6–2.3)

## 2016-12-23 NOTE — Progress Notes (Signed)
Cardiology Office Note   Date:  12/23/2016   ID:  Ruben [name], DOB 1951-04-07, MRN [id number]  PCP:  Tracey Harries, MD  Cardiologist:  previous Ruben Clement MD,  [name], MD   Chief Complaint  Patient presents with  . Follow-up    A-fib, CHF   Problem list 1. 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  Atrial fibrillation 3. Obesity 4. Hyperlipidemia 5. Obstructive sleep apnea 6. Mitral valve prolapse - mild MR by echo Sept. 2014    [name] - notes from Nolyn Swab is a 66 y.o. male who presents for Six-month follow-up visit    He has a past history of nonischemic dilated cardiomyopathy and associated atrial fibrillation at that time. He was hospitalized in February 2012 and was critically ill with severe systolic left ventricular dysfunction and atrial fibrillation. He was successfully cardioverted back to sinus rhythm in May 2012. He did well until August 2014 when he went back into atrial fibrillation after we had cut back his amiodarone to just 100 mg daily. His dose was increased and we cardioverted him successfully on 03/02/13. Since then he has maintained normal sinus rhythm. His echocardiogram in 01/14/13 showed that his ejection fraction had improved to 50-55%. Since last visit has had no new cardiac symptoms. He has a history of sleep apnea and uses a CPAP machine successfully.  The patient has a history of hypercholesterolemia. He is not experiencing any myalgias. Recent lab work includes normal hepatic function and normal thyroid function  He has had a slight cough which is resolving on Mucinex.He has gained weight.  His family gave him a fit.bit  And he is trying to get more exercise to help him lose weight.  November 07, 2015:  Hx of a viral cardiomyopathy in 2011. Never got over a cold - progressive shortness of breath.  EF was 10% at one point  EF h Ruben [name] is doing well.  Seen for the  first time today - transfer from [place]  Works for the state - [employer] .   No regular exercise .    No Cp ,  Breathing is good.  Jan. 16, 2018  Doing well. Has had a cold recently. Has not been exercising .   Jan.  25th, 2018:  Ruben [name] is seen back today for a work in visit.  During his last visit we decreased his dose of amiodarone. He has gone back into atrial fibrillation.  He has been active,   This past Sunday , he noticed that he was more short of breath,  More fatigued.   Was seen in the hospital - found to be in atrial fib  He notices a faster HR on his Fit Bit.   Has not missed any doses of anticoagulant .    Has not tried any other antiarrhythmics.   12/23/2016: Doing well Breathing is going well.   Has fatigue in the evening  Had Afib earlier this year, Started on Tikosyn 500 BID  Labs last week  Potassium = 4.4 No mag level was drawn  Echo from Feb. 2018 shows normal LV EF    Past Medical History:  Diagnosis Date  . Atrial fibrillation (HCC)   . Cough   . Non-ischemic cardiomyopathy (HCC)       . OSA (obstructive sleep apnea)     Past Surgical History:  Procedure Laterality Date  . CARDIOVERSION N/A 03/02/2013   Procedure: CARDIOVERSION;  Surgeon: Ruben Clement, MD;  Location: [facility] ENDOSCOPY;  Service: Cardiovascular;  Laterality: N/A;  . CARDIOVERSION N/A 08/05/2016   Procedure: CARDIOVERSION;  Surgeon: Ruben Si, MD;  Location: [facility] ENDOSCOPY;  Service: Cardiovascular;  Laterality: N/A;  . FOOT FRACTURE SURGERY    . PILONIDAL CYST EXCISION       Current Outpatient Prescriptions  Medication Sig Dispense Refill  . carvedilol (COREG) 12.5 MG tablet Take 0.5 tablets (6.25 mg total) by mouth 2 (two) times daily with a meal. 90 tablet 3  . dofetilide (TIKOSYN) 500 MCG capsule TAKE 1 CAPSULE BY MOUTH TWICE DAILY 60 capsule 3  . hydrALAZINE (APRESOLINE) 25 MG tablet TAKE ONE-HALF TABLET BY MOUTH THREE TIMES DAILY (Patient  taking differently: Take 12.5 mg by mouth 3 (three) times daily. ) 135 tablet 3  . lisinopril (PRINIVIL,ZESTRIL) 5 MG tablet TAKE ONE TABLET BY MOUTH ONCE DAILY 90 tablet 1  . Menthol, Topical Analgesic, (BEN GAY) 1.4 % PTCH Apply 1 patch topically daily as needed (for pain).    . pravastatin (PRAVACHOL) 20 MG tablet Take 20 mg by mouth daily.    Marland Kitchen warfarin (COUMADIN) 2 MG tablet TAKE AS DIRECTED BY  COUMADIN  CLINIC 160 tablet 1   No current facility-administered medications for this visit.     Allergies:   Patient has no known allergies.    Social History:  The patient  reports that he has never smoked. He has never used smokeless tobacco. He reports that he does not drink alcohol or use drugs.   Family History:  The patient's family history includes Cancer in his mother; Hypertension in his mother; Memory loss in his mother; Prostate cancer in his father; Stroke in his mother.    ROS:  Please see the history of present illness.   Otherwise, review of systems are positive for none.   All other systems are reviewed and negative.    PHYSICAL EXAM: VS:  BP 110/60   Pulse (!) 56   Ht 6\' 4"  (1.93 m)   Wt 263 lb 3.2 oz (119.4 kg)   BMI 32.04 kg/m  , BMI Body mass index is 32.04 kg/m. GEN: Well nourished, well developed, in no acute distress  HEENT: normal  Neck: no JVD, carotid bruits, or masses Cardiac: Irreg. Irreg.  1/6 systolic murmur, rubs, or gallops,no edema  Respiratory:  clear to auscultation bilaterally, normal work of breathing GI: soft, nontender, nondistended, + BS MS: no deformity or atrophy  Skin: warm and dry, no rash Neuro:  Strength and sensation are intact Psych: euthymic mood, full affect  EKG:  EKG is ordered today. The ekg ordered today atrial fib fib with a heart rate of 80. He has occasional aberrant conduction.  Recent Labs: 05/11/2016: Hemoglobin 14.4; Platelets 268 08/21/2016: Magnesium 1.9 12/17/2016: ALT 13; BUN 15; Creatinine, Ser 1.11; Potassium  4.4; Sodium 140; TSH 2.140    Lipid Panel    Component Value Date/Time   CHOL 152 12/17/2016 0737   TRIG 175 (H) 12/17/2016 0737   HDL 38 (L) 12/17/2016 0737   CHOLHDL 4.0 12/17/2016 0737   CHOLHDL 3.4 11/05/2015 0746   VLDL 31 (H) 11/05/2015 0746   LDLCALC 79 12/17/2016 0737   LDLDIRECT 90.2 12/30/2012 0740      Wt Readings from Last 3 Encounters:  12/23/16 263 lb 3.2 oz (119.4 kg)  09/09/16 269 lb (122 kg)  08/13/16 271 lb 3.2 oz (123 kg)      ASSESSMENT AND PLAN:  1.  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 has been stable   EF is now normal  Continue coreg, lisinopril, hydralazine   2. paroxysmal atrial fibrillation,   now on Tikosyn 500 BID On coumadin Discussed DOACs He wants to stay with coumadin for now   3. Dyslipidemia.We will continue pravastatin.  He is not having any myalgias.  Lipids look good from last week.  Recheck in 6 months .   4.   Mitral valve prolapse:   Mild MR.    [name], MD  12/23/2016 8:09 AM    [facility] [address] [place],  [address] [place], [place]  [address] Pager [phone] Phone: [phone]; Fax: [phone]

## 2016-12-23 NOTE — Patient Instructions (Signed)
Medication Instructions:  Your physician recommends that you continue on your current medications as directed. Please refer to the Current Medication list given to you today.   Labwork: TODAY - Mag level  Your physician recommends that you return for lab work in: 6 months on the day of or a few days before your office visit with Dr. [name].  You will need to FAST for this appointment - nothing to eat or drink after midnight the night before except water.   Testing/Procedures: None Ordered   Follow-Up: Your physician wants you to follow-up in: 6 months with Dr. [name].  You will receive a reminder letter in the mail two months in advance. If you don't receive a letter, please call our office to schedule the follow-up appointment.   If you need a refill on your cardiac medications before your next appointment, please call your pharmacy.   Thank you for choosing [facility]! Eligha Bridegroom, RN [phone]

## 2016-12-24 ENCOUNTER — Telehealth: Payer: Self-pay | Admitting: Cardiovascular Disease

## 2016-12-24 MED ORDER — MAGNESIUM OXIDE 400 MG PO TABS: 400.0000 mg | ORAL_TABLET | Freq: Every day | ORAL | 0 refills | Status: DC

## 2016-12-24 NOTE — Telephone Encounter (Signed)
New message ° ° ° °Pt is returning call to Michelle. °

## 2016-12-24 NOTE — Telephone Encounter (Signed)
Notes recorded by Nahser, Deloris Ping, MD on 12/23/2016 at 5:23 PM EDT Magnesium is slightly low. He should take Mag ox 400 mg a day  Recheck mag level in 1-2 weeks.  Spoke with pt and went over results and recommendations per Dr. [name]. Pt will have Mg level drawn at same time as his CVRR appt on 9/17. Pt verbalized understanding and was in agreement with this plan.

## 2017-01-04 ENCOUNTER — Ambulatory Visit (INDEPENDENT_AMBULATORY_CARE_PROVIDER_SITE_OTHER): Payer: BC Managed Care – PPO | Admitting: Pharmacist Clinician (PhC)/ Clinical Pharmacy Specialist

## 2017-01-04 ENCOUNTER — Encounter (INDEPENDENT_AMBULATORY_CARE_PROVIDER_SITE_OTHER): Payer: Self-pay

## 2017-01-04 ENCOUNTER — Other Ambulatory Visit: Payer: BC Managed Care – PPO | Admitting: *Deleted

## 2017-01-04 ENCOUNTER — Telehealth: Payer: Self-pay | Admitting: Nurse Practitioner

## 2017-01-04 DIAGNOSIS — I4891 Unspecified atrial fibrillation: Secondary | ICD-10-CM | POA: Diagnosis not present

## 2017-01-04 DIAGNOSIS — Z5181 Encounter for therapeutic drug level monitoring: Secondary | ICD-10-CM

## 2017-01-04 LAB — MAGNESIUM: MAGNESIUM: 1.9 mg/dL (ref 1.6–2.3)

## 2017-01-04 LAB — POCT INR: INR: 2.1

## 2017-01-04 MED ORDER — MAGNESIUM OXIDE 400 MG PO CAPS: ORAL_CAPSULE | ORAL | 0 refills | Status: DC

## 2017-01-04 NOTE — Telephone Encounter (Signed)
-----   Message from Vesta Mixer, MD sent at 01/04/2017  4:47 PM EDT ----- Mag level is on the low end of our goal of 2.0 Ask him to increase the mag ox to400 mg po QD with  400 BID on M, W, F  Recheck in 1 months

## 2017-01-04 NOTE — Telephone Encounter (Signed)
Reviewed results and plan of care with patient who verbalized understanding and agreement. He is scheduled for repeat mag level on 10/29 in addition to his coumadin clinic appointment. He thanked me for the call.

## 2017-02-15 ENCOUNTER — Telehealth: Payer: Self-pay | Admitting: Nurse Practitioner

## 2017-02-15 ENCOUNTER — Ambulatory Visit (INDEPENDENT_AMBULATORY_CARE_PROVIDER_SITE_OTHER): Payer: BC Managed Care – PPO | Admitting: *Deleted

## 2017-02-15 ENCOUNTER — Other Ambulatory Visit: Payer: BC Managed Care – PPO

## 2017-02-15 DIAGNOSIS — I481 Persistent atrial fibrillation: Secondary | ICD-10-CM | POA: Diagnosis not present

## 2017-02-15 DIAGNOSIS — I4819 Other persistent atrial fibrillation: Secondary | ICD-10-CM

## 2017-02-15 DIAGNOSIS — Z5181 Encounter for therapeutic drug level monitoring: Secondary | ICD-10-CM | POA: Diagnosis not present

## 2017-02-15 DIAGNOSIS — I4891 Unspecified atrial fibrillation: Secondary | ICD-10-CM

## 2017-02-15 LAB — POCT INR: INR: 1.7

## 2017-02-15 LAB — MAGNESIUM: Magnesium: 1.9 mg/dL (ref 1.6–2.3)

## 2017-02-15 MED ORDER — MAGNESIUM OXIDE 400 MG PO TABS: 400.0000 mg | ORAL_TABLET | Freq: Two times a day (BID) | ORAL | Status: DC

## 2017-02-15 NOTE — Telephone Encounter (Signed)
Reviewed results and plan of care with patient who verbalized understanding and agreement to increase mag oxide to 400 mg twice daily. He states he is tolerating it without any side effects. He thanked me for the call.

## 2017-02-15 NOTE — Telephone Encounter (Signed)
-----   Message from Vesta Mixer, MD sent at 02/15/2017  5:20 PM EDT ----- Mag is still sligtly low. Can he increase the magox to 400 mg BID  ( it appears that he is alternating QD with BID dosing scheme)  Recheck in 1 month If he is already having diarrhea with this, he may not be able to increase more than he already is doing

## 2017-03-01 ENCOUNTER — Ambulatory Visit (INDEPENDENT_AMBULATORY_CARE_PROVIDER_SITE_OTHER): Payer: BC Managed Care – PPO | Admitting: *Deleted

## 2017-03-01 DIAGNOSIS — Z5181 Encounter for therapeutic drug level monitoring: Secondary | ICD-10-CM

## 2017-03-01 DIAGNOSIS — I4891 Unspecified atrial fibrillation: Secondary | ICD-10-CM

## 2017-03-01 DIAGNOSIS — I4819 Other persistent atrial fibrillation: Secondary | ICD-10-CM

## 2017-03-01 DIAGNOSIS — I481 Persistent atrial fibrillation: Secondary | ICD-10-CM | POA: Diagnosis not present

## 2017-03-01 LAB — POCT INR: INR: 2.4

## 2017-03-01 NOTE — Progress Notes (Signed)
03/01/17-INR 2.4; Instructions continue taking 2 tablets daily except 1 tablet on Saturdays. Recheck INR in 4 weeks.

## 2017-03-13 ENCOUNTER — Other Ambulatory Visit: Payer: Self-pay | Admitting: Cardiovascular Disease

## 2017-04-03 ENCOUNTER — Other Ambulatory Visit: Payer: Self-pay | Admitting: Internal Medicine

## 2017-04-05 ENCOUNTER — Ambulatory Visit (INDEPENDENT_AMBULATORY_CARE_PROVIDER_SITE_OTHER): Payer: BC Managed Care – PPO | Admitting: *Deleted

## 2017-04-05 DIAGNOSIS — Z5181 Encounter for therapeutic drug level monitoring: Secondary | ICD-10-CM | POA: Diagnosis not present

## 2017-04-05 LAB — POCT INR: INR: 2

## 2017-04-05 NOTE — Patient Instructions (Signed)
Description   Continue taking 2 tablets daily except 1 tablet on Saturdays.  Recheck INR in 4 weeks. Call us with any concerns or changes . # [phone]- [phone]

## 2017-04-09 ENCOUNTER — Telehealth: Payer: Self-pay | Admitting: Cardiovascular Disease

## 2017-04-09 NOTE — Telephone Encounter (Signed)
Patient states he woke up this morning and "I feel off", he feels that he is back in afib with a little sob. Patient HR is controlled at 56 to 118 this morning while walking around the house.  BP 141/78. Patient is on Wafarin and Tikosyn 500 BID. Patient states he has plans to go out of town tomorrow and would like recommendations on what to do. I informed patient I would route to Dr. Ladona Ridgel for further recommendations. Patient verbalized understanding and thanked me for the call.

## 2017-04-09 NOTE — Telephone Encounter (Signed)
Call returned to Pt.  Notified Pt that Dr. Ladona Ridgel wants Pt to continue taking tikosyn, believes Pt should go back into NSR.  Pt on warfarin. Pt states he will be out of town for a week.  Pt will call this nurse upon return, if back in NSR no further needs.  If not back in NSR will set up with appt at afib clinic.  Will cont to monitor.

## 2017-04-09 NOTE — Telephone Encounter (Signed)
New message  Patient calling with concerns about HR. Please call    STAT if HR is under 50 or over 120 (normal HR is 60-100 beats per minute)  1) What is your heart rate? 54  2) Do you have a log of your heart rate readings (document readings)? 54, 55, 80, 61,75  3) Do you have any other symptoms? NO     Patient c/o Palpitations:  High priority if patient c/o lightheadedness, shortness of breath, or chest pain  1) How long have you had palpitations/irregular HR/ Afib? Are you having the symptoms now? Started late last night  2) Are you currently experiencing lightheadedness, SOB or CP? NO  3) Do you have a history of afib (atrial fibrillation) or irregular heart rhythm? YES  4) Have you checked your BP or HR? (document readings if available): 141/79 HR61  5) Are you experiencing any other symptoms? NO

## 2017-04-17 ENCOUNTER — Other Ambulatory Visit: Payer: Self-pay | Admitting: Cardiovascular Disease

## 2017-04-21 ENCOUNTER — Telehealth: Payer: Self-pay | Admitting: Internal Medicine

## 2017-04-21 NOTE — Telephone Encounter (Signed)
°  New message  Pt verbalized that he is returning call for the rn   And to call once he returned back from his trip

## 2017-04-21 NOTE — Telephone Encounter (Signed)
F/U calling: Patient would like an update on previous call

## 2017-04-22 NOTE — Telephone Encounter (Signed)
Patient calling and states that he is back from his trip and he is still out of rhythm. He states that he has been taking his tikosyn and his other meds as directed. Patient states that he is not in any distress but he has been more SOB and would like to know what the next step is. According to the note from Dr. Lubertha Basque RN from 12/21:  "Dr. Ladona Ridgel wants Pt to continue taking tikosyn, believes Pt should go back into NSR.  Pt on warfarin. Pt states he will be out of town for a week.  Pt will call this nurse upon return, if back in NSR no further needs.  If not back in NSR will set up with appt at afib clinic."  Made patient aware who is in agreement with this plan. Called Stacy at the [facility] and scheduled patient for 04/27/17 at 8:30 AM. Made patient aware who verbalizes understanding and thanked me for the call.

## 2017-04-22 NOTE — Telephone Encounter (Signed)
Follow up     Patients said that he is waiting on a response , its been 2 weeks since his heart went out of rhythm and he would like to know what the next step  bp -does not know Hr 50-60 when sleeping its 40's , with exertion its in 100's

## 2017-04-26 NOTE — Telephone Encounter (Signed)
See note from 04/21/2017 phone call.  Pt remains out of rhythm, appt made with afib clinic.

## 2017-04-27 ENCOUNTER — Encounter (HOSPITAL_COMMUNITY): Payer: Self-pay | Admitting: Nurse Practitioner

## 2017-04-27 ENCOUNTER — Ambulatory Visit (HOSPITAL_COMMUNITY)
Admission: RE | Admit: 2017-04-27 | Discharge: 2017-04-27 | Disposition: A | Discharge status: Home / Self Care | Payer: BC Managed Care – PPO | Source: Ambulatory Visit | Attending: Nurse Practitioner | Admitting: Nurse Practitioner

## 2017-04-27 VITALS — BP 112/62 | HR 101 | Ht 76.0 in | Wt 269.0 lb

## 2017-04-27 DIAGNOSIS — I428 Other cardiomyopathies: Secondary | ICD-10-CM | POA: Insufficient documentation

## 2017-04-27 DIAGNOSIS — Z7901 Long term (current) use of anticoagulants: Secondary | ICD-10-CM | POA: Diagnosis not present

## 2017-04-27 DIAGNOSIS — I4891 Unspecified atrial fibrillation: Secondary | ICD-10-CM | POA: Diagnosis present

## 2017-04-27 DIAGNOSIS — G4733 Obstructive sleep apnea (adult) (pediatric): Secondary | ICD-10-CM | POA: Diagnosis not present

## 2017-04-27 DIAGNOSIS — Z79899 Other long term (current) drug therapy: Secondary | ICD-10-CM | POA: Insufficient documentation

## 2017-04-27 DIAGNOSIS — I480 Paroxysmal atrial fibrillation: Secondary | ICD-10-CM | POA: Insufficient documentation

## 2017-04-27 LAB — BASIC METABOLIC PANEL
Anion gap: 9 (ref 5–15)
BUN: 14 mg/dL (ref 6–20)
CO2: 22 mmol/L (ref 22–32)
CREATININE: 1.03 mg/dL (ref 0.61–1.24)
Calcium: 8.7 mg/dL — ABNORMAL LOW (ref 8.9–10.3)
Chloride: 105 mmol/L (ref 101–111)
GFR calc Af Amer: >60 mL/min (ref >60)
Glucose, Bld: 126 mg/dL — ABNORMAL HIGH (ref 65–99)
Potassium: 3.9 mmol/L (ref 3.5–5.1)
SODIUM: 136 mmol/L (ref 135–145)

## 2017-04-27 LAB — CBC
HCT: 42.4 % (ref 39.0–52.0)
Hemoglobin: 13.6 g/dL (ref 13.0–17.0)
MCH: 30.3 pg (ref 26.0–34.0)
MCHC: 32.1 g/dL (ref 30.0–36.0)
MCV: 94.4 fL (ref 78.0–100.0)
Platelets: 318 K/uL (ref 150–400)
RBC: 4.49 MIL/uL (ref 4.22–5.81)
RDW: 12.7 % (ref 11.5–15.5)
WBC: 7.4 K/uL (ref 4.0–10.5)

## 2017-04-27 LAB — PROTIME-INR
INR: 1.98
Prothrombin Time: 22.3 seconds — ABNORMAL HIGH (ref 11.4–15.2)

## 2017-04-27 LAB — MAGNESIUM: MAGNESIUM: 1.8 mg/dL (ref 1.7–2.4)

## 2017-04-27 MED ORDER — MAGNESIUM OXIDE 400 MG PO TABS: 400.0000 mg | ORAL_TABLET | Freq: Two times a day (BID) | ORAL | 6 refills | Status: DC

## 2017-04-27 NOTE — Progress Notes (Signed)
Primary Care Physician: Verlon Au, MD Referring Physician: Dr. Melburn Popper EP: Dr. Mariea Clonts is a 67 y.o. male with a h/o NICM and afib. He was hospitalized in February 2012 and was critically ill with severe systolic left ventricular dysfunction and atrial fibrillation. He was successfully cardioverted back to sinus rhythm in May 2012. He did well until August 2014 when he went back into atrial fibrillation after  Amiodarone reduced to 100 mg daily. His dose was increased and he was cardioverted him successfully on 03/02/13. Since then he has maintained normal sinus rhythm. His echocardiogram in 01/14/13 showed that his ejection fraction had improved to 50-55%.  Due to concerns of long term complications of amiodarone, the dose of drug was decreased, but with subsequent return of atrial fibrillation. He  was in the afib clinic 05/22/16 to discuss options. He is fairly comfortable in afib, and is rate controlled at rest, but does notice that his heart rate will increase to 120 bpm with exertion.  He does not drink alcohol, no excessive caffeine, no tobacco. He is obese, does not exercise and admits that he eats a lot of junk food. He does have OSA and uses CPAP.  He eventually had amiodarone washout and was hospitalized in April for tikosyn.  F/u afib clinic,from tikosyn loading, 4/16-1/19.Marland Kitchen He did well with stable qtc, remains stable today at 446 ms. He is slow today at 49 bpm but not symptomatic.He feels better in SR with more energy.  Being seen in afib clinic 1/8. He has had persistent  Afib 12/19, notices more fatigue. He is interested in having cardioversion. No known triggers..   Today, he denies symptoms of  chest pain, orthopnea, PND, lower extremity edema, dizziness, presyncope, syncope, or neurologic sequela. Positive for fatigue. The patient is tolerating medications without difficulties and is otherwise without complaint today.   Past Medical History:    Diagnosis Date  . Atrial fibrillation (HCC)   . Cough   . Non-ischemic cardiomyopathy (HCC)       . OSA (obstructive sleep apnea)    Past Surgical History:  Procedure Laterality Date  . CARDIOVERSION N/A 03/02/2013   Procedure: CARDIOVERSION;  Surgeon: [name], MD;  Location: [facility] ENDOSCOPY;  Service: Cardiovascular;  Laterality: N/A;  . CARDIOVERSION N/A 08/05/2016   Procedure: CARDIOVERSION;  Surgeon: Chilton Si, MD;  Location: [facility] ENDOSCOPY;  Service: Cardiovascular;  Laterality: N/A;  . FOOT FRACTURE SURGERY    . PILONIDAL CYST EXCISION      Current Outpatient Medications  Medication Sig Dispense Refill  . carvedilol (COREG) 12.5 MG tablet Take 0.5 tablets (6.25 mg total) by mouth 2 (two) times daily with a meal. 90 tablet 3  . dofetilide (TIKOSYN) 500 MCG capsule TAKE 1 CAPSULE BY MOUTH TWICE DAILY 180 capsule 2  . hydrALAZINE (APRESOLINE) 25 MG tablet TAKE ONE-HALF TABLET BY MOUTH THREE TIMES DAILY (Patient taking differently: Take 12.5 mg by mouth 3 (three) times daily. ) 135 tablet 3  . lisinopril (PRINIVIL,ZESTRIL) 5 MG tablet TAKE 1 TABLET BY MOUTH ONCE DAILY 90 tablet 3  . magnesium oxide (MAG-OX) 400 MG tablet Take 1 tablet (400 mg total) by mouth 2 (two) times daily. 60 tablet 6  . Menthol, Topical Analgesic, (BEN GAY) 1.4 % PTCH Apply 1 patch topically daily as needed (for pain).    . pravastatin (PRAVACHOL) 20 MG tablet Take 20 mg by mouth daily.    Marland Kitchen warfarin (COUMADIN) 2 MG tablet TAKE AS DIRECTED BY  COUMADIN  CLINIC 160 tablet 1   No current facility-administered medications for this encounter.     No Known Allergies  Social History   Socioeconomic History  . Marital status: Married    Spouse name: Not on file  . Number of children: Not on file  . Years of education: Not on file  . Highest education level: Not on file  Social Needs  . Financial resource strain: Not on file  . Food insecurity - worry: Not on file  . Food insecurity -  inability: Not on file  . Transportation needs - medical: Not on file  . Transportation needs - non-medical: Not on file  Occupational History  . Not on file  Tobacco Use  . Smoking status: Never Smoker  . Smokeless tobacco: Never Used  Substance and Sexual Activity  . Alcohol use: No  . Drug use: No  . Sexual activity: Yes    Birth control/protection: Other-see comments  Other Topics Concern  . Not on file  Social History Narrative  . Not on file    Family History  Problem Relation Age of Onset  . Cancer Mother   . Stroke Mother   . Hypertension Mother   . Memory loss Mother   . Prostate cancer Father     ROS- All systems are reviewed and negative except as per the HPI above  Physical Exam: Vitals:   04/27/17 0837  BP: 112/62  Pulse: (!) 101  Weight: 269 lb (122 kg)  Height: 6\' 4"  (1.93 m)   Wt Readings from Last 3 Encounters:  04/27/17 269 lb (122 kg)  12/23/16 263 lb 3.2 oz (119.4 kg)  09/09/16 269 lb (122 kg)    Labs: Lab Results  Component Value Date   NA 140 12/17/2016   K 4.4 12/17/2016   CL 103 12/17/2016   CO2 21 12/17/2016   GLUCOSE 99 12/17/2016   BUN 15 12/17/2016   CREATININE 1.11 12/17/2016   CALCIUM 8.9 12/17/2016   PHOS 3.9 05/09/2010   MG 1.9 02/15/2017   Lab Results  Component Value Date   INR 2.0 04/05/2017   Lab Results  Component Value Date   CHOL 152 12/17/2016   HDL 38 (L) 12/17/2016   LDLCALC 79 12/17/2016   TRIG 175 (H) 12/17/2016     GEN- The patient is well appearing, alert and oriented x 3 today.   Head- normocephalic, atraumatic Eyes-  Sclera clear, conjunctiva pink Ears- hearing intact Oropharynx- clear Neck- supple, no JVP Lymph- no cervical lymphadenopathy Lungs- Clear to ausculation bilaterally, normal work of breathing Heart-  irregular rate and rhythm, no murmurs, rubs or gallops, PMI not laterally displaced GI- soft, NT, ND, + BS Extremities- no clubbing, cyanosis, or edema MS- no significant  deformity or atrophy Skin- no rash or lesion Psych- euthymic mood, full affect Neuro- strength and sensation are intact  EKG- afib at 101 bpm,pr int 196 ms, qrs int 102 ms, qtc 497 ms  Echo-2014-Study Conclusions  - Left ventricle: The cavity size was normal. There was mild focal basal hypertrophy of the septum. Systolic function was normal. The estimated ejection fraction was in the range of 50% to 55%. Wall motion was normal; there were no regional wall motion abnormalities. - Ascending aorta: The ascending aorta was mildly dilated. - Mitral valve: Mild prolapse, involving the anterior leaflet and the posterior leaflet. Mild regurgitation. - Left atrium: The atrium was mildly dilated. - Right atrium: The atrium was mildly dilated. INR- 1.7, 2/27  Assessment and Plan: 1. Afib On tikosyn since April, with return of afib, 12/19 Discussed options of cardioversion vrs referral to discuss ablation Pt wants to pursue cardioversion He is on warfarin and will need 4 weekly INR's He was offered TEE guided cardioversion but would rather wait for the therapeutic INR's Will coordinated with [address] coumadin clinic INR, CBC, BMET, Mag today   2. Lifestyle issues Encouraged healthier diet for weight loss Continue to use CPAP Regular exercise encouraged  2. H/o NICM Echo updated 06/01/16- EF 55-60%  F/u for DCCV as INR's come in therapeutic  Lupita Leash C. [facility] [address] [place], [place] [address] [phone]

## 2017-05-03 ENCOUNTER — Ambulatory Visit (INDEPENDENT_AMBULATORY_CARE_PROVIDER_SITE_OTHER): Payer: BC Managed Care – PPO | Admitting: Pharmacist

## 2017-05-03 DIAGNOSIS — Z5181 Encounter for therapeutic drug level monitoring: Secondary | ICD-10-CM | POA: Diagnosis not present

## 2017-05-03 LAB — POCT INR: INR: 2.1

## 2017-05-03 NOTE — Patient Instructions (Signed)
Continue taking 2 tablets daily except 1 tablet on Saturdays.  Recheck INR in 1 week, pending cardioversion. Call us with any concerns or changes . # [id number]- [phone]

## 2017-05-10 ENCOUNTER — Ambulatory Visit (INDEPENDENT_AMBULATORY_CARE_PROVIDER_SITE_OTHER): Payer: BC Managed Care – PPO | Admitting: *Deleted

## 2017-05-10 DIAGNOSIS — I4891 Unspecified atrial fibrillation: Secondary | ICD-10-CM | POA: Diagnosis not present

## 2017-05-10 DIAGNOSIS — Z5181 Encounter for therapeutic drug level monitoring: Secondary | ICD-10-CM

## 2017-05-10 LAB — POCT INR: INR: 2

## 2017-05-10 NOTE — Patient Instructions (Signed)
Description   Start taking 2 tablets daily.  Recheck INR in 1 week, pending cardioversion. Call us with any concerns or changes . # [phone]- [phone]

## 2017-05-11 ENCOUNTER — Telehealth (HOSPITAL_COMMUNITY): Payer: Self-pay | Admitting: *Deleted

## 2017-05-11 NOTE — Telephone Encounter (Signed)
-----   Message from Awilda Metro, [facility] sent at 04/27/2017 10:41 AM EST ----- Regarding: RE: weekly INRs We should still be ok to count this since it would have rounded up to 2 on our POC machine - when you call pt with his other results, would you be able to have him take an extra tablet today to bring him into range? (total of 3 tablets for today)  Thanks!  ----- Message ----- From: Shona Simpson, RN Sent: 04/27/2017  10:30 AM To: Megan E Supple, RPH Subject: RE: weekly INRs                                Pt was 1.98 today.... ----- Message ----- From: Awilda Metro, [facility] Sent: 04/27/2017   9:49 AM To: Shona Simpson, RN Subject: RE: weekly INRs                                Got it, thanks!  ----- Message ----- From: Shona Simpson, RN Sent: 04/27/2017   9:12 AM To: Megan E Supple, RPH Subject: weekly INRs                                    Pt is pending cardioversion once weekly INRs. We drew his INR today and he has appt with coumadin 1/14. Thanks [employer]

## 2017-05-11 NOTE — Telephone Encounter (Signed)
INR 1/14 @ 2.1 INR 1/21 @ 2.0 Scheduled for INR check on 1/28 prior to cardioversion

## 2017-05-17 ENCOUNTER — Other Ambulatory Visit: Payer: Self-pay

## 2017-05-17 ENCOUNTER — Ambulatory Visit (INDEPENDENT_AMBULATORY_CARE_PROVIDER_SITE_OTHER): Payer: BC Managed Care – PPO | Admitting: *Deleted

## 2017-05-17 ENCOUNTER — Encounter (HOSPITAL_COMMUNITY): Admission: RE | Payer: Self-pay | Source: Ambulatory Visit

## 2017-05-17 ENCOUNTER — Ambulatory Visit (HOSPITAL_COMMUNITY)
Admission: RE | Admit: 2017-05-17 | Discharge: 2017-05-17 | Disposition: A | Discharge status: Home / Self Care | Payer: BC Managed Care – PPO | Source: Ambulatory Visit | Attending: Nurse Practitioner | Admitting: Nurse Practitioner

## 2017-05-17 ENCOUNTER — Ambulatory Visit (HOSPITAL_COMMUNITY)
Admission: RE | Admit: 2017-05-17 | Payer: BC Managed Care – PPO | Source: Ambulatory Visit | Admitting: Cardiovascular Disease

## 2017-05-17 DIAGNOSIS — Z5181 Encounter for therapeutic drug level monitoring: Secondary | ICD-10-CM | POA: Diagnosis present

## 2017-05-17 DIAGNOSIS — I4891 Unspecified atrial fibrillation: Secondary | ICD-10-CM | POA: Diagnosis not present

## 2017-05-17 DIAGNOSIS — R001 Bradycardia, unspecified: Secondary | ICD-10-CM | POA: Diagnosis not present

## 2017-05-17 LAB — POCT INR: INR: 2

## 2017-05-17 SURGERY — CARDIOVERSION
Anesthesia: General

## 2017-05-17 NOTE — Patient Instructions (Addendum)
Description   Today after DCCV take 2.5 tablets then continue taking 2 tablets daily.  Recheck INR in 1 week, cardioversion today. Call us with any concerns or changes. # [id number]- [phone]

## 2017-05-17 NOTE — Progress Notes (Addendum)
Pt in for pre dccv labs.  Pt was found to be in NSR and so labs were not drawn and dccv was canceled  Pt is not sure when he went back in rhythm but ekg shows Sinus brady at 50 bpm, pr int 208 ms, qrs int 96 ms, qtc 461 ms. Cardioversion this am cancelled and he will f/u with Dr. Melburn Popper in March as scheduled

## 2017-05-24 ENCOUNTER — Ambulatory Visit (INDEPENDENT_AMBULATORY_CARE_PROVIDER_SITE_OTHER): Payer: BC Managed Care – PPO | Admitting: *Deleted

## 2017-05-24 DIAGNOSIS — Z5181 Encounter for therapeutic drug level monitoring: Secondary | ICD-10-CM

## 2017-05-24 LAB — POCT INR: INR: 2.6

## 2017-05-24 NOTE — Patient Instructions (Signed)
Description   Continue taking 2 tablets daily.  Recheck INR in 2 weeks.  Call us with any concerns or changes. # [id number]- [id number]

## 2017-05-26 ENCOUNTER — Ambulatory Visit (HOSPITAL_COMMUNITY): Payer: BC Managed Care – PPO | Admitting: Nurse Practitioner

## 2017-06-07 ENCOUNTER — Ambulatory Visit (INDEPENDENT_AMBULATORY_CARE_PROVIDER_SITE_OTHER): Payer: BC Managed Care – PPO | Admitting: Pharmacist

## 2017-06-07 DIAGNOSIS — Z5181 Encounter for therapeutic drug level monitoring: Secondary | ICD-10-CM

## 2017-06-07 LAB — POCT INR: INR: 2.4

## 2017-06-07 NOTE — Patient Instructions (Signed)
Description   Continue taking 2 tablets daily. Recheck INR in 4 weeks.  Call us with any concerns or changes. # [phone]     

## 2017-07-05 ENCOUNTER — Ambulatory Visit (INDEPENDENT_AMBULATORY_CARE_PROVIDER_SITE_OTHER): Payer: BC Managed Care – PPO | Admitting: *Deleted

## 2017-07-05 DIAGNOSIS — Z5181 Encounter for therapeutic drug level monitoring: Secondary | ICD-10-CM

## 2017-07-05 LAB — POCT INR: INR: 2.1

## 2017-07-05 NOTE — Patient Instructions (Signed)
Description   Continue taking 2 tablets daily. Recheck INR in 4 weeks.  Call us with any concerns or changes. # [phone]     

## 2017-07-17 ENCOUNTER — Other Ambulatory Visit: Payer: Self-pay | Admitting: Cardiovascular Disease

## 2017-08-02 ENCOUNTER — Ambulatory Visit (INDEPENDENT_AMBULATORY_CARE_PROVIDER_SITE_OTHER): Payer: BC Managed Care – PPO | Admitting: *Deleted

## 2017-08-02 DIAGNOSIS — Z5181 Encounter for therapeutic drug level monitoring: Secondary | ICD-10-CM | POA: Diagnosis not present

## 2017-08-02 LAB — POCT INR: INR: 1.8

## 2017-08-02 NOTE — Patient Instructions (Addendum)
  Description   Today April 15th take 2 and 1/2 tablets then continue taking 2 tablets daily.  Recheck INR in 2 weeks.  Call us with any concerns or changes. # [id number]- [id number]

## 2017-08-17 ENCOUNTER — Other Ambulatory Visit: Payer: BC Managed Care – PPO

## 2017-08-17 ENCOUNTER — Ambulatory Visit (INDEPENDENT_AMBULATORY_CARE_PROVIDER_SITE_OTHER): Payer: BC Managed Care – PPO | Admitting: Pharmacist

## 2017-08-17 DIAGNOSIS — I480 Paroxysmal atrial fibrillation: Secondary | ICD-10-CM

## 2017-08-17 DIAGNOSIS — I5022 Chronic systolic (congestive) heart failure: Secondary | ICD-10-CM

## 2017-08-17 DIAGNOSIS — E782 Mixed hyperlipidemia: Secondary | ICD-10-CM

## 2017-08-17 DIAGNOSIS — Z5181 Encounter for therapeutic drug level monitoring: Secondary | ICD-10-CM | POA: Diagnosis not present

## 2017-08-17 DIAGNOSIS — I4891 Unspecified atrial fibrillation: Secondary | ICD-10-CM | POA: Diagnosis not present

## 2017-08-17 DIAGNOSIS — Z79899 Other long term (current) drug therapy: Secondary | ICD-10-CM

## 2017-08-17 LAB — HEPATIC FUNCTION PANEL
ALT: 16 IU/L (ref 0–44)
AST: 17 IU/L (ref 0–40)
Albumin: 3.9 g/dL (ref 3.6–4.8)
Alkaline Phosphatase: 93 IU/L (ref 39–117)
BILIRUBIN, DIRECT: 0.1 mg/dL (ref 0.00–0.40)
Bilirubin Total: 0.3 mg/dL (ref 0.0–1.2)
TOTAL PROTEIN: 6.5 g/dL (ref 6.0–8.5)

## 2017-08-17 LAB — LIPID PANEL
CHOLESTEROL TOTAL: 164 mg/dL (ref 100–199)
Chol/HDL Ratio: 4.8 ratio (ref 0.0–5.0)
HDL: 34 mg/dL — AB (ref >39)
LDL CALC: 87 mg/dL (ref 0–99)
TRIGLYCERIDES: 217 mg/dL — AB (ref 0–149)
VLDL CHOLESTEROL CAL: 43 mg/dL — AB (ref 5–40)

## 2017-08-17 LAB — BASIC METABOLIC PANEL
BUN/Creatinine Ratio: 16 (ref 10–24)
BUN: 18 mg/dL (ref 8–27)
CALCIUM: 8.8 mg/dL (ref 8.6–10.2)
CHLORIDE: 104 mmol/L (ref 96–106)
CO2: 23 mmol/L (ref 20–29)
CREATININE: 1.14 mg/dL (ref 0.76–1.27)
GFR calc Af Amer: 77 mL/min/{1.73_m2} (ref >59)
GFR calc non Af Amer: 67 mL/min/{1.73_m2} (ref >59)
GLUCOSE: 104 mg/dL — AB (ref 65–99)
Potassium: 4.4 mmol/L (ref 3.5–5.2)
Sodium: 141 mmol/L (ref 134–144)

## 2017-08-17 LAB — POCT INR: INR: 2.3

## 2017-08-17 NOTE — Patient Instructions (Signed)
Description   Continue taking 2 tablets daily. Recheck INR in 4 weeks.  Call us with any concerns or changes. # [phone]     

## 2017-08-18 ENCOUNTER — Encounter: Payer: Self-pay | Admitting: Cardiovascular Disease

## 2017-08-18 ENCOUNTER — Ambulatory Visit (INDEPENDENT_AMBULATORY_CARE_PROVIDER_SITE_OTHER): Payer: BC Managed Care – PPO | Admitting: Cardiovascular Disease

## 2017-08-18 VITALS — BP 118/62 | HR 50 | Ht 76.0 in | Wt 272.8 lb

## 2017-08-18 DIAGNOSIS — I480 Paroxysmal atrial fibrillation: Secondary | ICD-10-CM | POA: Diagnosis not present

## 2017-08-18 DIAGNOSIS — E781 Pure hyperglyceridemia: Secondary | ICD-10-CM

## 2017-08-18 DIAGNOSIS — I5042 Chronic combined systolic (congestive) and diastolic (congestive) heart failure: Secondary | ICD-10-CM | POA: Diagnosis not present

## 2017-08-18 DIAGNOSIS — Z79899 Other long term (current) drug therapy: Secondary | ICD-10-CM | POA: Diagnosis not present

## 2017-08-18 DIAGNOSIS — E782 Mixed hyperlipidemia: Secondary | ICD-10-CM

## 2017-08-18 MED ORDER — HYDRALAZINE HCL 25 MG PO TABS: ORAL_TABLET | ORAL | 3 refills | Status: DC

## 2017-08-18 NOTE — Progress Notes (Signed)
Cardiology Office Note   Date:  08/18/2017   ID:  Ruben [name], DOB 1950/05/20, MRN [id number]  PCP:  Ruben Au, MD  Cardiologist:  previous Ruben Clement MD,  Ruben Miss, MD   Chief Complaint  Patient presents with  . Congestive Heart Failure   Problem list 1. 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  Atrial fibrillation 3. Obesity 4. Hyperlipidemia 5. Obstructive sleep apnea 6. Mitral valve prolapse - mild MR by echo Sept. 2014    [name] - notes from Ruben [name] is a 67 y.o. male who presents for Six-month follow-up visit    He has a past history of nonischemic dilated cardiomyopathy and associated atrial fibrillation at that time. He was hospitalized in February 2012 and was critically ill with severe systolic left ventricular dysfunction and atrial fibrillation. He was successfully cardioverted back to sinus rhythm in May 2012. He did well until August 2014 when he went back into atrial fibrillation after we had cut back his amiodarone to just 100 mg daily. His dose was increased and we cardioverted him successfully on 03/02/13. Since then he has maintained normal sinus rhythm. His echocardiogram in 01/14/13 showed that his ejection fraction had improved to 50-55%. Since last visit has had no new cardiac symptoms. He has a history of sleep apnea and uses a CPAP machine successfully.  The patient has a history of hypercholesterolemia. He is not experiencing any myalgias. Recent lab work includes normal hepatic function and normal thyroid function  He has had a slight cough which is resolving on Mucinex.He has gained weight.  His family gave him a fit.bit  And he is trying to get more exercise to help him lose weight.  November 07, 2015:  Hx of a viral cardiomyopathy in 2011. Never got over a cold - progressive shortness of breath.  EF was 10% at one point  EF h Ruben [name] is doing well.  Seen  for the first time today - transfer from [place]  Works for the state - [employer] .   No regular exercise .    No Cp ,  Breathing is good.  Jan. 16, 2018  Doing well. Has had a cold recently. Has not been exercising .   Jan.  25th, 2018:  Ruben [name] is seen back today for a work in visit.  During his last visit we decreased his dose of amiodarone. He has gone back into atrial fibrillation.  He has been active,   This past Sunday , he noticed that he was more short of breath,  More fatigued.   Was seen in the hospital - found to be in atrial fib  He notices a faster HR on his Fit Bit.   Has not missed any doses of anticoagulant .    Has not tried any other antiarrhythmics.   12/23/2016: Doing well Breathing is going well.   Has fatigue in the evening  Had Afib earlier this year, Started on Tikosyn 500 BID  Labs last week  Potassium = 4.4 No mag level was drawn  Echo from Feb. 2018 shows normal LV EF   Aug 18, 2017:   Ruben [name] is seen today for follow-up of his atrial fibrillation and chronic systolic congestive heart failure. His A. fib has been well controlled on Tikosyn. Had an episode of AF after shoveling snow.  Lasted for a day or so   Past Medical History:  Diagnosis Date  .  Atrial fibrillation (HCC)   . Cough   . Non-ischemic cardiomyopathy (HCC)       . OSA (obstructive sleep apnea)     Past Surgical History:  Procedure Laterality Date  . CARDIOVERSION N/A 03/02/2013   Procedure: CARDIOVERSION;  Surgeon: Ruben Clement, MD;  Location[facility] ENDOSCOPY;  Service: Cardiovascular;  Laterality: N/A;  . CARDIOVERSION N/A 08/05/2016   Procedure: CARDIOVERSION;  Surgeon: Ruben Si, MD;  Location: [facility] ENDOSCOPY;  Service: Cardiovascular;  Laterality: N/A;  . FOOT FRACTURE SURGERY    . PILONIDAL CYST EXCISION       Current Outpatient Medications  Medication Sig Dispense Refill  . acetaminophen (TYLENOL) 500 MG tablet Take 1,000 mg by mouth  every 8 (eight) hours as needed for mild pain or headache.    . carvedilol (COREG) 12.5 MG tablet Take 0.5 tablets (6.25 mg total) by mouth 2 (two) times daily with a meal. 90 tablet 3  . dofetilide (TIKOSYN) 500 MCG capsule TAKE 1 CAPSULE BY MOUTH TWICE DAILY 180 capsule 2  . guaiFENesin (MUCINEX) 600 MG 12 hr tablet Take 600 mg by mouth 2 (two) times daily as needed for cough or to loosen phlegm.    . hydrALAZINE (APRESOLINE) 25 MG tablet TAKE 0.5 TABLET (12.5 mg)  BY MOUTH THREE TIMES DAILY. Please keep 08/18/17 appt for further refills 45 tablet 0  . Lido-Capsaicin-Men-Methyl Sal (MEDI-PATCH-LIDOCAINE EX) Apply 1 patch topically daily as needed (PAIN).    Marland Kitchen lisinopril (PRINIVIL,ZESTRIL) 5 MG tablet TAKE 1 TABLET BY MOUTH ONCE DAILY 90 tablet 3  . magnesium oxide (MAG-OX) 400 MG tablet Take 1 tablet (400 mg total) by mouth 2 (two) times daily. 60 tablet 6  . pravastatin (PRAVACHOL) 20 MG tablet Take 20 mg by mouth daily.    Marland Kitchen warfarin (COUMADIN) 2 MG tablet TAKE AS DIRECTED BY  COUMADIN  CLINIC (Patient taking differently: TAKES 4 MG (2 TABLETS ) DAILY EXCEPT ON SATURDAYS AND TAKES 2 MG (1 TABLET)) 160 tablet 1   No current facility-administered medications for this visit.     Allergies:   Patient has no known allergies.    Social History:  The patient  reports that he has never smoked. He has never used smokeless tobacco. He reports that he does not drink alcohol or use drugs.   Family History:  The patient's family history includes Cancer in his mother; Hypertension in his mother; Memory loss in his mother; Prostate cancer in his father; Stroke in his mother.    ROS:  Please see the history of present illness.   Otherwise, review of systems are positive for none.   All other systems are reviewed and negative.    PHYSICAL EXAM: VS:  BP 118/62   Pulse (!) 50   Ht 6\' 4"  (1.93 m)   Wt 272 lb 12.8 oz (123.7 kg)   SpO2 94%   BMI 33.21 kg/m  , BMI Body mass index is 33.21 kg/m. GEN:  Well nourished, well developed, in no acute distress  HEENT: normal  Neck: no JVD, carotid bruits, or masses Cardiac: RR,  Occasional PVCs  Respiratory:  clear to auscultation bilaterally, normal work of breathing GI: soft, nontender, nondistended, + BS MS:  Right leg 1 +   Skin: warm and dry, no rash Neuro:  Strength and sensation are intact Psych: euthymic mood, full affect  EKG: Aug 18, 2017: Sinus bradycardia at 50 beats minute.  Occasional premature ventricular contractions.  Otherwise normal EKG.  Recent Labs: 12/17/2016: TSH 2.140  04/27/2017: Hemoglobin 13.6; Magnesium 1.8; Platelets 318 08/17/2017: ALT 16; BUN 18; Creatinine, Ser 1.14; Potassium 4.4; Sodium 141    Lipid Panel    Component Value Date/Time   CHOL 164 08/17/2017 0756   TRIG 217 (H) 08/17/2017 0756   HDL 34 (L) 08/17/2017 0756   CHOLHDL 4.8 08/17/2017 0756   CHOLHDL 3.4 11/05/2015 0746   VLDL 31 (H) 11/05/2015 0746   LDLCALC 87 08/17/2017 0756   LDLDIRECT 90.2 12/30/2012 0740      Wt Readings from Last 3 Encounters:  08/18/17 272 lb 12.8 oz (123.7 kg)  04/27/17 269 lb (122 kg)  12/23/16 263 lb 3.2 oz (119.4 kg)      ASSESSMENT AND PLAN:  1.  Chronic combined S/D  CHF :   nonischemic cardiomyopathy initially presenting as probable viral myocarditis in 2012. has been stable   EF is now normal     2. paroxysmal atrial fibrillation,   now on Tikosyn 500 BID Potassium level is good Mag level was added today     3. Dyslipidemia.  Trigs are elevated.    Needs to work on better diet and exercise program   Recheck in 6 months .   4.   Mitral valve prolapse:   Mild MR.    Ruben Miss, MD  08/18/2017 4:17 PM    [facility] [address] [place],  [address] [place], [place]  [address] Pager [phone] Phone: [phone]; Fax: [phone]

## 2017-08-18 NOTE — Patient Instructions (Signed)

## 2017-08-19 LAB — SPECIMEN STATUS REPORT

## 2017-08-19 LAB — MAGNESIUM: Magnesium: 2.2 mg/dL (ref 1.6–2.3)

## 2017-08-21 ENCOUNTER — Other Ambulatory Visit: Payer: Self-pay | Admitting: Cardiovascular Disease

## 2017-09-15 ENCOUNTER — Ambulatory Visit (INDEPENDENT_AMBULATORY_CARE_PROVIDER_SITE_OTHER): Payer: BC Managed Care – PPO | Admitting: *Deleted

## 2017-09-15 DIAGNOSIS — Z5181 Encounter for therapeutic drug level monitoring: Secondary | ICD-10-CM

## 2017-09-15 LAB — POCT INR: INR: 3 (ref 2.0–3.0)

## 2017-09-15 NOTE — Patient Instructions (Signed)
Description   Continue taking 2 tablets daily. Recheck INR in 4 weeks.  Call us with any concerns or changes. # [phone]     

## 2017-10-13 ENCOUNTER — Ambulatory Visit (INDEPENDENT_AMBULATORY_CARE_PROVIDER_SITE_OTHER): Payer: BC Managed Care – PPO | Admitting: *Deleted

## 2017-10-13 DIAGNOSIS — Z5181 Encounter for therapeutic drug level monitoring: Secondary | ICD-10-CM

## 2017-10-13 LAB — POCT INR: INR: 2.8 (ref 2.0–3.0)

## 2017-10-13 NOTE — Patient Instructions (Signed)
Description   Continue taking 2 tablets daily. Recheck INR in 4 weeks.  Call us with any concerns or changes. # [phone]     

## 2017-11-10 ENCOUNTER — Ambulatory Visit (INDEPENDENT_AMBULATORY_CARE_PROVIDER_SITE_OTHER): Payer: BC Managed Care – PPO | Admitting: *Deleted

## 2017-11-10 DIAGNOSIS — Z5181 Encounter for therapeutic drug level monitoring: Secondary | ICD-10-CM

## 2017-11-10 LAB — POCT INR: INR: 2.5 (ref 2.0–3.0)

## 2017-11-10 NOTE — Patient Instructions (Addendum)
Description   Continue taking 2 tablets daily. Recheck INR in 4 weeks.  Call us with any concerns or changes. # [phone]     

## 2017-11-13 ENCOUNTER — Other Ambulatory Visit (HOSPITAL_COMMUNITY): Payer: Self-pay | Admitting: Nurse Practitioner

## 2017-12-09 ENCOUNTER — Ambulatory Visit (INDEPENDENT_AMBULATORY_CARE_PROVIDER_SITE_OTHER): Payer: BC Managed Care – PPO | Admitting: *Deleted

## 2017-12-09 ENCOUNTER — Encounter (INDEPENDENT_AMBULATORY_CARE_PROVIDER_SITE_OTHER): Payer: Self-pay

## 2017-12-09 DIAGNOSIS — Z5181 Encounter for therapeutic drug level monitoring: Secondary | ICD-10-CM

## 2017-12-09 DIAGNOSIS — I4891 Unspecified atrial fibrillation: Secondary | ICD-10-CM

## 2017-12-09 LAB — POCT INR: INR: 3 (ref 2.0–3.0)

## 2017-12-09 NOTE — Patient Instructions (Signed)
Description   Continue taking 2 tablets daily. Recheck INR in 6 weeks.  Call us with any concerns or changes. # [phone]      

## 2017-12-28 ENCOUNTER — Other Ambulatory Visit: Payer: Self-pay | Admitting: Internal Medicine

## 2017-12-28 ENCOUNTER — Other Ambulatory Visit: Payer: Self-pay | Admitting: Cardiovascular Disease

## 2018-01-19 ENCOUNTER — Encounter (INDEPENDENT_AMBULATORY_CARE_PROVIDER_SITE_OTHER): Payer: Self-pay

## 2018-01-19 ENCOUNTER — Ambulatory Visit (INDEPENDENT_AMBULATORY_CARE_PROVIDER_SITE_OTHER): Payer: BC Managed Care – PPO | Admitting: Pharmacist

## 2018-01-19 DIAGNOSIS — Z5181 Encounter for therapeutic drug level monitoring: Secondary | ICD-10-CM | POA: Diagnosis not present

## 2018-01-19 LAB — POCT INR: INR: 2.2 (ref 2.0–3.0)

## 2018-01-19 NOTE — Patient Instructions (Signed)
Description   Continue taking 2 tablets daily. Recheck INR in 6 weeks.  Call us with any concerns or changes. # [phone]      

## 2018-02-14 ENCOUNTER — Other Ambulatory Visit: Payer: BC Managed Care – PPO | Admitting: *Deleted

## 2018-02-14 ENCOUNTER — Ambulatory Visit (INDEPENDENT_AMBULATORY_CARE_PROVIDER_SITE_OTHER): Payer: BC Managed Care – PPO | Admitting: Pharmacist

## 2018-02-14 DIAGNOSIS — I480 Paroxysmal atrial fibrillation: Secondary | ICD-10-CM

## 2018-02-14 DIAGNOSIS — Z5181 Encounter for therapeutic drug level monitoring: Secondary | ICD-10-CM | POA: Diagnosis not present

## 2018-02-14 DIAGNOSIS — I5042 Chronic combined systolic (congestive) and diastolic (congestive) heart failure: Secondary | ICD-10-CM

## 2018-02-14 DIAGNOSIS — Z79899 Other long term (current) drug therapy: Secondary | ICD-10-CM

## 2018-02-14 DIAGNOSIS — E781 Pure hyperglyceridemia: Secondary | ICD-10-CM

## 2018-02-14 DIAGNOSIS — E782 Mixed hyperlipidemia: Secondary | ICD-10-CM

## 2018-02-14 LAB — LIPID PANEL
CHOL/HDL RATIO: 3.9 ratio (ref 0.0–5.0)
Cholesterol, Total: 159 mg/dL (ref 100–199)
HDL: 41 mg/dL (ref >39)
LDL CALC: 91 mg/dL (ref 0–99)
Triglycerides: 134 mg/dL (ref 0–149)
VLDL Cholesterol Cal: 27 mg/dL (ref 5–40)

## 2018-02-14 LAB — POCT INR: INR: 2.3 (ref 2.0–3.0)

## 2018-02-14 LAB — HEPATIC FUNCTION PANEL
ALT: 16 IU/L (ref 0–44)
AST: 14 IU/L (ref 0–40)
Albumin: 4.1 g/dL (ref 3.6–4.8)
Alkaline Phosphatase: 88 IU/L (ref 39–117)
BILIRUBIN, DIRECT: 0.12 mg/dL (ref 0.00–0.40)
Bilirubin Total: 0.5 mg/dL (ref 0.0–1.2)
TOTAL PROTEIN: 6.3 g/dL (ref 6.0–8.5)

## 2018-02-14 LAB — BASIC METABOLIC PANEL
BUN / CREAT RATIO: 16 (ref 10–24)
BUN: 19 mg/dL (ref 8–27)
CO2: 21 mmol/L (ref 20–29)
CREATININE: 1.17 mg/dL (ref 0.76–1.27)
Calcium: 8.7 mg/dL (ref 8.6–10.2)
Chloride: 104 mmol/L (ref 96–106)
GFR calc Af Amer: 75 mL/min/{1.73_m2} (ref >59)
GFR, EST NON AFRICAN AMERICAN: 65 mL/min/{1.73_m2} (ref >59)
GLUCOSE: 111 mg/dL — AB (ref 65–99)
Potassium: 4.1 mmol/L (ref 3.5–5.2)
SODIUM: 139 mmol/L (ref 134–144)

## 2018-02-14 LAB — MAGNESIUM: Magnesium: 2 mg/dL (ref 1.6–2.3)

## 2018-02-14 NOTE — Addendum Note (Signed)
Addended by: Tonita Phoenix on: 02/14/2018 07:54 AM   Modules accepted: Orders

## 2018-02-14 NOTE — Patient Instructions (Signed)
Description   Continue taking 2 tablets daily. Recheck INR in 6 weeks.  Call us with any concerns or changes. # [phone]      

## 2018-02-15 ENCOUNTER — Other Ambulatory Visit: Payer: BC Managed Care – PPO

## 2018-02-16 ENCOUNTER — Encounter: Payer: Self-pay | Admitting: Cardiovascular Disease

## 2018-02-16 ENCOUNTER — Ambulatory Visit (INDEPENDENT_AMBULATORY_CARE_PROVIDER_SITE_OTHER): Payer: BC Managed Care – PPO | Admitting: Cardiovascular Disease

## 2018-02-16 VITALS — BP 94/56 | HR 50 | Ht 76.0 in | Wt 268.4 lb

## 2018-02-16 DIAGNOSIS — I5022 Chronic systolic (congestive) heart failure: Secondary | ICD-10-CM

## 2018-02-16 DIAGNOSIS — E785 Hyperlipidemia, unspecified: Secondary | ICD-10-CM | POA: Diagnosis not present

## 2018-02-16 DIAGNOSIS — I480 Paroxysmal atrial fibrillation: Secondary | ICD-10-CM

## 2018-02-16 NOTE — Patient Instructions (Signed)

## 2018-02-16 NOTE — Progress Notes (Signed)
Cardiology Office Note   Date:  02/16/2018   ID:  DAVD GLAZEBROOK, DOB 1951/01/13, MRN [id number]  PCP:  Verlon Au, MD  Cardiologist:  previous [name] MD,  [name], MD   Chief Complaint  Patient presents with  . Congestive Heart Failure  . Atrial Fibrillation   Problem list 1. 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  Atrial fibrillation 3. Obesity 4. Hyperlipidemia 5. Obstructive sleep apnea 6. Mitral valve prolapse - mild MR by echo Sept. 2014    [name] - notes from Orey Haider is a 67 y.o. male who presents for Six-month follow-up visit    He has a past history of nonischemic dilated cardiomyopathy and associated atrial fibrillation at that time. He was hospitalized in February 2012 and was critically ill with severe systolic left ventricular dysfunction and atrial fibrillation. He was successfully cardioverted back to sinus rhythm in May 2012. He did well until August 2014 when he went back into atrial fibrillation after we had cut back his amiodarone to just 100 mg daily. His dose was increased and we cardioverted him successfully on 03/02/13. Since then he has maintained normal sinus rhythm. His echocardiogram in 01/14/13 showed that his ejection fraction had improved to 50-55%. Since last visit has had no new cardiac symptoms. He has a history of sleep apnea and uses a CPAP machine successfully.  The patient has a history of hypercholesterolemia. He is not experiencing any myalgias. Recent lab work includes normal hepatic function and normal thyroid function  He has had a slight cough which is resolving on Mucinex.He has gained weight.  His family gave him a fit.bit  And he is trying to get more exercise to help him lose weight.  November 07, 2015:  Hx of a viral cardiomyopathy in 2011. Never got over a cold - progressive shortness of breath.  EF was 10% at one point  EF  h [name] is doing well.  Seen for the first time today - transfer from [place]  Works for the state - [employer] .   No regular exercise .    No Cp ,  Breathing is good.  Jan. 16, 2018  Doing well. Has had a cold recently. Has not been exercising .   Jan.  25th, 2018:  [name] is seen back today for a work in visit.  During his last visit we decreased his dose of amiodarone. He has gone back into atrial fibrillation.  He has been active,   This past Sunday , he noticed that he was more short of breath,  More fatigued.   Was seen in the hospital - found to be in atrial fib  He notices a faster HR on his Fit Bit.   Has not missed any doses of anticoagulant .    Has not tried any other antiarrhythmics.   12/23/2016: Doing well Breathing is going well.   Has fatigue in the evening  Had Afib earlier this year, Started on Tikosyn 500 BID  Labs last week  Potassium = 4.4 No mag level was drawn  Echo from Feb. 2018 shows normal LV EF   Aug 18, 2017:   [name] is seen today for follow-up of his atrial fibrillation and chronic systolic congestive heart failure. His A. fib has been well controlled on Tikosyn. Had an episode of AF after shoveling snow.  Lasted for a day or so   February 16, 2018: Is  here to follow-up with his atrial fibrillation and chronic systolic congestive heart failure.  His atrial fibrillation is well controlled on Tikosyn.  His magnesium and potassium levels were drawn several days ago and are normal.  Had a brief episode of PAF - lasted for a month and then went back into rhythm.  Has had a cough - has been taking mucinex  He thought he had some fluid retention. EF was normal .       Past Medical History:  Diagnosis Date  . Atrial fibrillation (HCC)   . Cough   . Non-ischemic cardiomyopathy (HCC)       . OSA (obstructive sleep apnea)     Past Surgical History:  Procedure Laterality Date  . CARDIOVERSION N/A 03/02/2013   Procedure:  CARDIOVERSION;  Surgeon: [name], MD;  Location: [facility] ENDOSCOPY;  Service: Cardiovascular;  Laterality: N/A;  . CARDIOVERSION N/A 08/05/2016   Procedure: CARDIOVERSION;  Surgeon: Chilton Si, MD;  Location: [facility] ENDOSCOPY;  Service: Cardiovascular;  Laterality: N/A;  . FOOT FRACTURE SURGERY    . PILONIDAL CYST EXCISION       Current Outpatient Medications  Medication Sig Dispense Refill  . acetaminophen (TYLENOL) 500 MG tablet Take 1,000 mg by mouth every 8 (eight) hours as needed for mild pain or headache.    . carvedilol (COREG) 12.5 MG tablet TAKE ONE TABLET BY MOUTH TWICE DAILY 180 tablet 1  . dofetilide (TIKOSYN) 500 MCG capsule TAKE 1 CAPSULE BY MOUTH TWICE DAILY 180 capsule 2  . guaiFENesin (MUCINEX) 600 MG 12 hr tablet Take 600 mg by mouth 2 (two) times daily as needed for cough or to loosen phlegm.    . hydrALAZINE (APRESOLINE) 25 MG tablet TAKE 0.5 TABLET (12.5 mg)  BY MOUTH THREE TIMES DAILY. 135 tablet 3  . Lido-Capsaicin-Men-Methyl Sal (MEDI-PATCH-LIDOCAINE EX) Apply 1 patch topically daily as needed (PAIN).    Marland Kitchen lisinopril (PRINIVIL,ZESTRIL) 5 MG tablet TAKE 1 TABLET BY MOUTH ONCE DAILY 90 tablet 3  . magnesium oxide (MAG-OX) 400 (241.3 Mg) MG tablet TAKE 1 TABLET BY MOUTH TWICE DAILY 60 tablet 6  . pravastatin (PRAVACHOL) 20 MG tablet Take 20 mg by mouth daily.    Marland Kitchen warfarin (COUMADIN) 2 MG tablet TAKE AS DIRECTED BY  COUMADIN  CLINIC 65 tablet 3   No current facility-administered medications for this visit.     Allergies:   Patient has no known allergies.    Social History:  The patient  reports that he has never smoked. He has never used smokeless tobacco. He reports that he does not drink alcohol or use drugs.   Family History:  The patient's family history includes Cancer in his mother; Hypertension in his mother; Memory loss in his mother; Prostate cancer in his father; Stroke in his mother.    ROS:  Please see the history of present illness.   Otherwise,  review of systems are positive for none.   All other systems are reviewed and negative.    Physical Exam: Blood pressure (!) 94/56, pulse (!) 50, height 6\' 4"  (1.93 m), weight 268 lb 6.4 oz (121.7 kg), SpO2 97 %.  GEN:  Well nourished, well developed in no acute distress HEENT: Normal NECK: No JVD; No carotid bruits LYMPHATICS: No lymphadenopathy CARDIAC: RRR , no murmurs, rubs, gallops RESPIRATORY:  Clear to auscultation without rales, wheezing or rhonchi  ABDOMEN: Soft, non-tender, non-distended MUSCULOSKELETAL:  No edema; No deformity  SKIN: Warm and dry NEUROLOGIC:  Alert and oriented x 3  EKG: February 16, 2018: Sinus bradycardia at 50 beats a minute.  Occasional premature ventricular contractions.  QT interval is normal at 464 ms.  Recent Labs: 04/27/2017: Hemoglobin 13.6; Platelets 318 02/14/2018: ALT 16; BUN 19; Creatinine, Ser 1.17; Magnesium 2.0; Potassium 4.1; Sodium 139    Lipid Panel    Component Value Date/Time   CHOL 159 02/14/2018 0755   TRIG 134 02/14/2018 0755   HDL 41 02/14/2018 0755   CHOLHDL 3.9 02/14/2018 0755   CHOLHDL 3.4 11/05/2015 0746   VLDL 31 (H) 11/05/2015 0746   LDLCALC 91 02/14/2018 0755   LDLDIRECT 90.2 12/30/2012 0740      Wt Readings from Last 3 Encounters:  02/16/18 268 lb 6.4 oz (121.7 kg)  08/18/17 272 lb 12.8 oz (123.7 kg)  04/27/17 269 lb (122 kg)      ASSESSMENT AND PLAN:  1.  Chronic combined S/D  CHF :  EF has been normal.  He does have intermittent episodes of leg swelling.  He does still eats some salty foods.  Of advised him to watch his salt intake.   2. paroxysmal atrial fibrillation,  Continue Tikosyn.  QTC is 464 ms. He seems to be tolerating this well.  He did have one episode of paroxysmal atrial relation that lasted about a month.  He converted back to sinus rhythm on his own.  3. Dyslipidemia.  Trigs are elevated.    Needs to work on better diet and exercise program   Recheck in 6 months .   4.   Mitral  valve prolapse:   Mild MR.    [name], MD  02/16/2018 8:46 AM    [facility] [address] [place],  [address] [place], [place]  [address] Pager [phone] Phone: [phone]; Fax: [phone]

## 2018-03-28 ENCOUNTER — Ambulatory Visit (INDEPENDENT_AMBULATORY_CARE_PROVIDER_SITE_OTHER): Payer: BC Managed Care – PPO | Admitting: *Deleted

## 2018-03-28 ENCOUNTER — Encounter (INDEPENDENT_AMBULATORY_CARE_PROVIDER_SITE_OTHER): Payer: Self-pay

## 2018-03-28 DIAGNOSIS — Z5181 Encounter for therapeutic drug level monitoring: Secondary | ICD-10-CM | POA: Diagnosis not present

## 2018-03-28 LAB — POCT INR: INR: 2.2 (ref 2.0–3.0)

## 2018-03-28 NOTE — Patient Instructions (Signed)
Description   Continue taking 2 tablets daily. Recheck INR in 6 weeks.  Call us with any concerns or changes. # [phone]      

## 2018-04-02 ENCOUNTER — Other Ambulatory Visit: Payer: Self-pay | Admitting: Cardiovascular Disease

## 2018-04-11 ENCOUNTER — Telehealth: Payer: Self-pay | Admitting: Cardiovascular Disease

## 2018-04-11 NOTE — Telephone Encounter (Signed)
New Message    Patient states he feels pace maker is a little off and wants to know how to handle that.  Patient doesn't have sob or chest pain.  (dot phrases not loading)

## 2018-04-11 NOTE — Telephone Encounter (Signed)
Routed to triage in previous encounter

## 2018-04-11 NOTE — Telephone Encounter (Addendum)
We do not have any records of this patient having a pacemaker.

## 2018-04-11 NOTE — Telephone Encounter (Signed)
Pt states he is having arrhthymias. And has some questions on what he should do. Pt do not have a device.

## 2018-04-11 NOTE — Telephone Encounter (Signed)
Spoke with patient who states he is currently in a fib with a HR of approximately 90 bpm that started sometime today. He denies missing doses of either Tikosyn or Coumadin. He denies dizziness, states he gets a little SOB going up stairs but he is able to do ADLs without difficulty. He states he is not uncomfortable but wanted to let us know that he is out of rhythm. I do not see any PRN medications that patient can use. I placed him on hold and made an appointment for him with [name], NP on Friday 12/27.  When I got back on the phone with patient he states he does recall missing a dose of Coumadin one day last week but he has not missed any other doses in a very long time. I advised him to continue current medications until appointment Friday, practice relaxation techniques, and to call back with worsening symptoms. I also advised him to call [facility] to cancel appointment if he converts on his own. He verbalized understanding and agreement and thanked me for the call.

## 2018-04-15 ENCOUNTER — Encounter (HOSPITAL_COMMUNITY): Payer: Self-pay | Admitting: Nurse Practitioner

## 2018-04-15 ENCOUNTER — Ambulatory Visit (HOSPITAL_COMMUNITY)
Admission: RE | Admit: 2018-04-15 | Discharge: 2018-04-15 | Disposition: A | Discharge status: Home / Self Care | Payer: BC Managed Care – PPO | Source: Ambulatory Visit | Attending: Nurse Practitioner | Admitting: Nurse Practitioner

## 2018-04-15 VITALS — BP 104/76 | HR 114 | Ht 76.0 in | Wt 270.0 lb

## 2018-04-15 DIAGNOSIS — G4733 Obstructive sleep apnea (adult) (pediatric): Secondary | ICD-10-CM | POA: Diagnosis not present

## 2018-04-15 DIAGNOSIS — I4819 Other persistent atrial fibrillation: Secondary | ICD-10-CM | POA: Diagnosis present

## 2018-04-15 DIAGNOSIS — I4891 Unspecified atrial fibrillation: Secondary | ICD-10-CM

## 2018-04-15 DIAGNOSIS — I428 Other cardiomyopathies: Secondary | ICD-10-CM | POA: Insufficient documentation

## 2018-04-15 LAB — PROTIME-INR
INR: 2.08
PROTHROMBIN TIME: 23.1 s — AB (ref 11.4–15.2)

## 2018-04-15 LAB — BASIC METABOLIC PANEL
ANION GAP: 9 (ref 5–15)
BUN: 16 mg/dL (ref 8–23)
CALCIUM: 9 mg/dL (ref 8.9–10.3)
CO2: 22 mmol/L (ref 22–32)
Chloride: 107 mmol/L (ref 98–111)
Creatinine, Ser: 1.12 mg/dL (ref 0.61–1.24)
Glucose, Bld: 98 mg/dL (ref 70–99)
Potassium: 4.2 mmol/L (ref 3.5–5.1)
Sodium: 138 mmol/L (ref 135–145)

## 2018-04-15 LAB — MAGNESIUM: Magnesium: 1.9 mg/dL (ref 1.7–2.4)

## 2018-04-15 NOTE — Progress Notes (Addendum)
Primary Care Physician: Verlon AuBoyd, Tammy Lamonica, MD Referring Physician: Dr. Melburn PopperNasher EP: Dr. Mariea Clontsaylor   Ruben [name] is a 67 y.o. male with a h/o NICM and afib. He was hospitalized in February 2012 and was critically ill with severe systolic left ventricular dysfunction and atrial fibrillation. He was successfully cardioverted back to sinus rhythm in May 2012. He did well until August 2014 when he went back into atrial fibrillation after  Amiodarone reduced to 100 mg daily. His dose was increased and he was cardioverted him successfully on 03/02/13. Since then he has maintained normal sinus rhythm. His echocardiogram in 01/14/13 showed that his ejection fraction had improved to 50-55%.  Due to concerns of long term complications of amiodarone, the dose of drug was decreased, but with subsequent return of atrial fibrillation. He  was in the afib clinic 05/22/16 to discuss options. He is fairly comfortable in afib, and is rate controlled at rest, but does notice that his heart rate will increase to 120 bpm with exertion.  He does not drink alcohol, no excessive caffeine, no tobacco. He is obese, does not exercise and admits that he eats a lot of junk food. He does have OSA and uses CPAP.  He eventually had amiodarone washout and was hospitalized in April for tikosyn.  F/u afib clinic,from tikosyn loading, 4/16-1/19.Marland Kitchen. He did well with stable qtc, remains stable today at 446 ms. He is slow today at 49 bpm but not symptomatic.He feels better in SR with more energy.  Being seen in afib clinic 04/27/17. He has had persistent  Afib 12/19, notices more fatigue. He is interested in having cardioversion. No known triggers.  F/u in afib clinic 04/15/18. He felt that he went into afib last weekend. He did this back in late December 2018 /early January 2019  and went back into NSR while awaiting to get cardioverted. He has not missed any tikosyn. He is rate controlled. 114 bpm on EKG but with quiet sitting,  his HR's were in the 60/70's. He will need weekly INR's.  Today, he denies symptoms of  chest pain, orthopnea, PND, lower extremity edema, dizziness, presyncope, syncope, or neurologic sequela. Positive for fatigue. The patient is tolerating medications without difficulties and is otherwise without complaint today.   Past Medical History:  Diagnosis Date  . Atrial fibrillation (HCC)   . Cough   . Non-ischemic cardiomyopathy (HCC)       . OSA (obstructive sleep apnea)    Past Surgical History:  Procedure Laterality Date  . CARDIOVERSION N/A 03/02/2013   Procedure: CARDIOVERSION;  Surgeon: Cassell Clementhomas Brackbill, MD;  [facility];  Service: Cardiovascular;  Laterality: N/A;  . CARDIOVERSION N/A 08/05/2016   Procedure: CARDIOVERSION;  Surgeon: Chilton Siiffany Welcome, MD;  [facility];  Service: Cardiovascular;  Laterality: N/A;  . FOOT FRACTURE SURGERY    . PILONIDAL CYST EXCISION      Current Outpatient Medications  Medication Sig Dispense Refill  . acetaminophen (TYLENOL) 500 MG tablet Take 1,000 mg by mouth every 8 (eight) hours as needed for mild pain or headache.    . carvedilol (COREG) 12.5 MG tablet TAKE ONE TABLET BY MOUTH TWICE DAILY 180 tablet 1  . dofetilide (TIKOSYN) 500 MCG capsule TAKE 1 CAPSULE BY MOUTH TWICE DAILY 180 capsule 2  . hydrALAZINE (APRESOLINE) 25 MG tablet TAKE 0.5 TABLET (12.5 mg)  BY MOUTH THREE TIMES DAILY. 135 tablet 3  . lisinopril (PRINIVIL,ZESTRIL) 5 MG tablet TAKE 1 TABLET BY MOUTH ONCE DAILY 90 tablet  3  . magnesium oxide (MAG-OX) 400 (241.3 Mg) MG tablet TAKE 1 TABLET BY MOUTH TWICE DAILY 60 tablet 6  . pravastatin (PRAVACHOL) 20 MG tablet Take 20 mg by mouth daily.    Marland Kitchen warfarin (COUMADIN) 2 MG tablet TAKE AS DIRECTED BY  COUMADIN  CLINIC 65 tablet 3  . guaiFENesin (MUCINEX) 600 MG 12 hr tablet Take 600 mg by mouth 2 (two) times daily as needed for cough or to loosen phlegm.     No current facility-administered medications for this  encounter.     No Known Allergies  Social History   Socioeconomic History  . Marital status: Married    Spouse name: Not on file  . Number of children: Not on file  . Years of education: Not on file  . Highest education level: Not on file  Occupational History  . Not on file  Social Needs  . Financial resource strain: Not on file  . Food insecurity:    Worry: Not on file    Inability: Not on file  . Transportation needs:    Medical: Not on file    Non-medical: Not on file  Tobacco Use  . Smoking status: Never Smoker  . Smokeless tobacco: Never Used  Substance and Sexual Activity  . Alcohol use: No  . Drug use: No  . Sexual activity: Yes    Birth control/protection: Other-see comments  Lifestyle  . Physical activity:    Days per week: Not on file    Minutes per session: Not on file  . Stress: Not on file  Relationships  . Social connections:    Talks on phone: Not on file    Gets together: Not on file    Attends religious service: Not on file    Active member of club or organization: Not on file    Attends meetings of clubs or organizations: Not on file    Relationship status: Not on file  . Intimate partner violence:    Fear of current or ex partner: Not on file    Emotionally abused: Not on file    Physically abused: Not on file    Forced sexual activity: Not on file  Other Topics Concern  . Not on file  Social History Narrative  . Not on file    Family History  Problem Relation Age of Onset  . Cancer Mother   . Stroke Mother   . Hypertension Mother   . Memory loss Mother   . Prostate cancer Father     ROS- All systems are reviewed and negative except as per the HPI above  Physical Exam: Vitals:   04/15/18 1436  BP: 104/76  Pulse: (!) 114  Weight: 122.5 kg  Height: 6\' 4"  (1.93 m)   Wt Readings from Last 3 Encounters:  04/15/18 122.5 kg  02/16/18 121.7 kg  08/18/17 123.7 kg    Labs: Lab Results  Component Value Date   NA 139  02/14/2018   K 4.1 02/14/2018   CL 104 02/14/2018   CO2 21 02/14/2018   GLUCOSE 111 (H) 02/14/2018   BUN 19 02/14/2018   CREATININE 1.17 02/14/2018   CALCIUM 8.7 02/14/2018   PHOS 3.9 05/09/2010   MG 2.0 02/14/2018   Lab Results  Component Value Date   INR 2.2 03/28/2018   Lab Results  Component Value Date   CHOL 159 02/14/2018   HDL 41 02/14/2018   LDLCALC 91 02/14/2018   TRIG 134 02/14/2018  GEN- The patient is well appearing, alert and oriented x 3 today.   Head- normocephalic, atraumatic Eyes-  Sclera clear, conjunctiva pink Ears- hearing intact Oropharynx- clear Neck- supple, no JVP Lymph- no cervical lymphadenopathy Lungs- Clear to ausculation bilaterally, normal work of breathing Heart-  irregular rate and rhythm, no murmurs, rubs or gallops, PMI not laterally displaced GI- soft, NT, ND, + BS Extremities- no clubbing, cyanosis, or edema MS- no significant deformity or atrophy Skin- no rash or lesion Psych- euthymic mood, full affect Neuro- strength and sensation are intact  EKG- afib at 114 bpm, qrs int 102 ms, qtc 512 ms  Echo-2014-Study Conclusions  - Left ventricle: The cavity size was normal. There was mild focal basal hypertrophy of the septum. Systolic function was normal. The estimated ejection fraction was in the range of 50% to 55%. Wall motion was normal; there were no regional wall motion abnormalities. - Ascending aorta: The ascending aorta was mildly dilated. - Mitral valve: Mild prolapse, involving the anterior leaflet and the posterior leaflet. Mild regurgitation. - Left atrium: The atrium was mildly dilated. - Right atrium: The atrium was mildly dilated. INR- 1.7, 2/27     Assessment and Plan: 1. Afib On tikosyn since April 2018, with return of afib this past week end.  Rate controlled. Discussed options of cardioversion vrs referral to discuss ablation Pt wants to pursue cardioversion He is on warfarin and will need  4 weekly INR's Will coordinated with [address] coumadin clinic INR,  BMET, Mag today   2. Lifestyle issues Encouraged healthier diet for weight loss Continue to use CPAP Regular exercise encouraged  2. H/o NICM Echo updated 06/01/16- EF 55-60%  F/u for DCCV as INR's come in therapeutic or if pt feels he returned to [facility] [address] [place], [place] [address] [phone]

## 2018-04-15 NOTE — Addendum Note (Signed)
Encounter addended by: Newman Nip, NP on: 04/15/2018 3:32 PM  Actions taken: Clinical Note Signed

## 2018-04-15 NOTE — H&P (View-Only) (Signed)
Primary Care Physician: Verlon AuBoyd, Tammy Lamonica, MD Referring Physician: Dr. Melburn PopperNasher EP: Dr. Mariea Clontsaylor   Ruben [name] is a 67 y.o. male with a h/o NICM and afib. He was hospitalized in February 2012 and was critically ill with severe systolic left ventricular dysfunction and atrial fibrillation. He was successfully cardioverted back to sinus rhythm in May 2012. He did well until August 2014 when he went back into atrial fibrillation after  Amiodarone reduced to 100 mg daily. His dose was increased and he was cardioverted him successfully on 03/02/13. Since then he has maintained normal sinus rhythm. His echocardiogram in 01/14/13 showed that his ejection fraction had improved to 50-55%.  Due to concerns of long term complications of amiodarone, the dose of drug was decreased, but with subsequent return of atrial fibrillation. He  was in the afib clinic 05/22/16 to discuss options. He is fairly comfortable in afib, and is rate controlled at rest, but does notice that his heart rate will increase to 120 bpm with exertion.  He does not drink alcohol, no excessive caffeine, no tobacco. He is obese, does not exercise and admits that he eats a lot of junk food. He does have OSA and uses CPAP.  He eventually had amiodarone washout and was hospitalized in April for tikosyn.  F/u afib clinic,from tikosyn loading, 4/16-1/19.Marland Kitchen. He did well with stable qtc, remains stable today at 446 ms. He is slow today at 49 bpm but not symptomatic.He feels better in SR with more energy.  Being seen in afib clinic 04/27/17. He has had persistent  Afib 12/19, notices more fatigue. He is interested in having cardioversion. No known triggers.  F/u in afib clinic 04/15/18. He felt that he went into afib last weekend. He did this back in late December 2018 /early January 2019  and went back into NSR while awaiting to get cardioverted. He has not missed any tikosyn. He is rate controlled. 114 bpm on EKG but with quiet sitting,  his HR's were in the 60/70's. He will need weekly INR's.  Today, he denies symptoms of  chest pain, orthopnea, PND, lower extremity edema, dizziness, presyncope, syncope, or neurologic sequela. Positive for fatigue. The patient is tolerating medications without difficulties and is otherwise without complaint today.   Past Medical History:  Diagnosis Date  . Atrial fibrillation (HCC)   . Cough   . Non-ischemic cardiomyopathy (HCC)       . OSA (obstructive sleep apnea)    Past Surgical History:  Procedure Laterality Date  . CARDIOVERSION N/A 03/02/2013   Procedure: CARDIOVERSION;  Surgeon: Cassell Clementhomas Brackbill, MD;  [facility];  Service: Cardiovascular;  Laterality: N/A;  . CARDIOVERSION N/A 08/05/2016   Procedure: CARDIOVERSION;  Surgeon: Chilton Siiffany Welcome, MD;  [facility];  Service: Cardiovascular;  Laterality: N/A;  . FOOT FRACTURE SURGERY    . PILONIDAL CYST EXCISION      Current Outpatient Medications  Medication Sig Dispense Refill  . acetaminophen (TYLENOL) 500 MG tablet Take 1,000 mg by mouth every 8 (eight) hours as needed for mild pain or headache.    . carvedilol (COREG) 12.5 MG tablet TAKE ONE TABLET BY MOUTH TWICE DAILY 180 tablet 1  . dofetilide (TIKOSYN) 500 MCG capsule TAKE 1 CAPSULE BY MOUTH TWICE DAILY 180 capsule 2  . hydrALAZINE (APRESOLINE) 25 MG tablet TAKE 0.5 TABLET (12.5 mg)  BY MOUTH THREE TIMES DAILY. 135 tablet 3  . lisinopril (PRINIVIL,ZESTRIL) 5 MG tablet TAKE 1 TABLET BY MOUTH ONCE DAILY 90 tablet  3  . magnesium oxide (MAG-OX) 400 (241.3 Mg) MG tablet TAKE 1 TABLET BY MOUTH TWICE DAILY 60 tablet 6  . pravastatin (PRAVACHOL) 20 MG tablet Take 20 mg by mouth daily.    Marland Kitchen warfarin (COUMADIN) 2 MG tablet TAKE AS DIRECTED BY  COUMADIN  CLINIC 65 tablet 3  . guaiFENesin (MUCINEX) 600 MG 12 hr tablet Take 600 mg by mouth 2 (two) times daily as needed for cough or to loosen phlegm.     No current facility-administered medications for this  encounter.     No Known Allergies  Social History   Socioeconomic History  . Marital status: Married    Spouse name: Not on file  . Number of children: Not on file  . Years of education: Not on file  . Highest education level: Not on file  Occupational History  . Not on file  Social Needs  . Financial resource strain: Not on file  . Food insecurity:    Worry: Not on file    Inability: Not on file  . Transportation needs:    Medical: Not on file    Non-medical: Not on file  Tobacco Use  . Smoking status: Never Smoker  . Smokeless tobacco: Never Used  Substance and Sexual Activity  . Alcohol use: No  . Drug use: No  . Sexual activity: Yes    Birth control/protection: Other-see comments  Lifestyle  . Physical activity:    Days per week: Not on file    Minutes per session: Not on file  . Stress: Not on file  Relationships  . Social connections:    Talks on phone: Not on file    Gets together: Not on file    Attends religious service: Not on file    Active member of club or organization: Not on file    Attends meetings of clubs or organizations: Not on file    Relationship status: Not on file  . Intimate partner violence:    Fear of current or ex partner: Not on file    Emotionally abused: Not on file    Physically abused: Not on file    Forced sexual activity: Not on file  Other Topics Concern  . Not on file  Social History Narrative  . Not on file    Family History  Problem Relation Age of Onset  . Cancer Mother   . Stroke Mother   . Hypertension Mother   . Memory loss Mother   . Prostate cancer Father     ROS- All systems are reviewed and negative except as per the HPI above  Physical Exam: Vitals:   04/15/18 1436  BP: 104/76  Pulse: (!) 114  Weight: 122.5 kg  Height: 6\' 4"  (1.93 m)   Wt Readings from Last 3 Encounters:  04/15/18 122.5 kg  02/16/18 121.7 kg  08/18/17 123.7 kg    Labs: Lab Results  Component Value Date   NA 139  02/14/2018   K 4.1 02/14/2018   CL 104 02/14/2018   CO2 21 02/14/2018   GLUCOSE 111 (H) 02/14/2018   BUN 19 02/14/2018   CREATININE 1.17 02/14/2018   CALCIUM 8.7 02/14/2018   PHOS 3.9 05/09/2010   MG 2.0 02/14/2018   Lab Results  Component Value Date   INR 2.2 03/28/2018   Lab Results  Component Value Date   CHOL 159 02/14/2018   HDL 41 02/14/2018   LDLCALC 91 02/14/2018   TRIG 134 02/14/2018  GEN- The patient is well appearing, alert and oriented x 3 today.   Head- normocephalic, atraumatic Eyes-  Sclera clear, conjunctiva pink Ears- hearing intact Oropharynx- clear Neck- supple, no JVP Lymph- no cervical lymphadenopathy Lungs- Clear to ausculation bilaterally, normal work of breathing Heart-  irregular rate and rhythm, no murmurs, rubs or gallops, PMI not laterally displaced GI- soft, NT, ND, + BS Extremities- no clubbing, cyanosis, or edema MS- no significant deformity or atrophy Skin- no rash or lesion Psych- euthymic mood, full affect Neuro- strength and sensation are intact  EKG- afib at 114 bpm, qrs int 102 ms, qtc 512 ms  Echo-2014-Study Conclusions  - Left ventricle: The cavity size was normal. There was mild focal basal hypertrophy of the septum. Systolic function was normal. The estimated ejection fraction was in the range of 50% to 55%. Wall motion was normal; there were no regional wall motion abnormalities. - Ascending aorta: The ascending aorta was mildly dilated. - Mitral valve: Mild prolapse, involving the anterior leaflet and the posterior leaflet. Mild regurgitation. - Left atrium: The atrium was mildly dilated. - Right atrium: The atrium was mildly dilated. INR- 1.7, 2/27     Assessment and Plan: 1. Afib On tikosyn since April 2018, with return of afib this past week end.  Rate controlled. Discussed options of cardioversion vrs referral to discuss ablation Pt wants to pursue cardioversion He is on warfarin and will need  4 weekly INR's Will coordinated with [address] coumadin clinic INR,  BMET, Mag today   2. Lifestyle issues Encouraged healthier diet for weight loss Continue to use CPAP Regular exercise encouraged  2. H/o NICM Echo updated 06/01/16- EF 55-60%  F/u for DCCV as INR's come in therapeutic or if pt feels he returned to [facility] [address] [place], [place] [address] [phone]

## 2018-04-18 ENCOUNTER — Other Ambulatory Visit (HOSPITAL_COMMUNITY): Payer: Self-pay | Admitting: *Deleted

## 2018-04-18 MED ORDER — CARVEDILOL 12.5 MG PO TABS: 6.2500 mg | ORAL_TABLET | Freq: Two times a day (BID) | ORAL | 1 refills | Status: DC

## 2018-04-22 ENCOUNTER — Ambulatory Visit (INDEPENDENT_AMBULATORY_CARE_PROVIDER_SITE_OTHER): Payer: BC Managed Care – PPO | Admitting: Pharmacist

## 2018-04-22 DIAGNOSIS — Z5181 Encounter for therapeutic drug level monitoring: Secondary | ICD-10-CM | POA: Diagnosis not present

## 2018-04-22 DIAGNOSIS — I4891 Unspecified atrial fibrillation: Secondary | ICD-10-CM | POA: Diagnosis not present

## 2018-04-22 LAB — POCT INR: INR: 2.3 (ref 2.0–3.0)

## 2018-04-22 NOTE — Patient Instructions (Signed)
Continue taking 2 tablets daily.  Recheck INR in 1 week for cardioversion.  Call us with any concerns or changes. # [id number]- [id number]

## 2018-04-29 ENCOUNTER — Ambulatory Visit (INDEPENDENT_AMBULATORY_CARE_PROVIDER_SITE_OTHER): Payer: BC Managed Care – PPO | Admitting: *Deleted

## 2018-04-29 DIAGNOSIS — Z5181 Encounter for therapeutic drug level monitoring: Secondary | ICD-10-CM | POA: Diagnosis not present

## 2018-04-29 DIAGNOSIS — I4891 Unspecified atrial fibrillation: Secondary | ICD-10-CM

## 2018-04-29 LAB — POCT INR: INR: 2.4 (ref 2.0–3.0)

## 2018-04-29 NOTE — Patient Instructions (Signed)
Description   Continue taking 2 tablets daily.  Recheck INR in 1 week for cardioversion.  Call us with any concerns or changes. # [id number]- [id number]

## 2018-05-02 ENCOUNTER — Other Ambulatory Visit (HOSPITAL_COMMUNITY): Payer: Self-pay | Admitting: *Deleted

## 2018-05-02 DIAGNOSIS — I4819 Other persistent atrial fibrillation: Secondary | ICD-10-CM

## 2018-05-06 ENCOUNTER — Ambulatory Visit (INDEPENDENT_AMBULATORY_CARE_PROVIDER_SITE_OTHER): Payer: BC Managed Care – PPO | Admitting: Pharmacist

## 2018-05-06 ENCOUNTER — Other Ambulatory Visit: Payer: BC Managed Care – PPO | Admitting: *Deleted

## 2018-05-06 DIAGNOSIS — Z5181 Encounter for therapeutic drug level monitoring: Secondary | ICD-10-CM

## 2018-05-06 DIAGNOSIS — I4819 Other persistent atrial fibrillation: Secondary | ICD-10-CM

## 2018-05-06 LAB — POCT INR: INR: 2.1 (ref 2.0–3.0)

## 2018-05-06 NOTE — Patient Instructions (Signed)
Description   Take 3 tablets then continue taking 2 tablets daily.  Recheck INR in 1 week for cardioversion.  Call us with any concerns or changes. # [id number]- [id number]

## 2018-05-07 ENCOUNTER — Other Ambulatory Visit: Payer: Self-pay | Admitting: Cardiovascular Disease

## 2018-05-07 LAB — BASIC METABOLIC PANEL
BUN/Creatinine Ratio: 12 (ref 10–24)
BUN: 13 mg/dL (ref 8–27)
CO2: 21 mmol/L (ref 20–29)
Calcium: 8.9 mg/dL (ref 8.6–10.2)
Chloride: 104 mmol/L (ref 96–106)
Creatinine, Ser: 1.09 mg/dL (ref 0.76–1.27)
GFR calc Af Amer: 81 mL/min/{1.73_m2} (ref >59)
GFR calc non Af Amer: 70 mL/min/{1.73_m2} (ref >59)
Glucose: 112 mg/dL — ABNORMAL HIGH (ref 65–99)
Potassium: 4.4 mmol/L (ref 3.5–5.2)
SODIUM: 139 mmol/L (ref 134–144)

## 2018-05-07 LAB — CBC
Hematocrit: 40.4 % (ref 37.5–51.0)
Hemoglobin: 13.6 g/dL (ref 13.0–17.7)
MCH: 30.5 pg (ref 26.6–33.0)
MCHC: 33.7 g/dL (ref 31.5–35.7)
MCV: 91 fL (ref 79–97)
Platelets: 313 10*3/uL (ref 150–450)
RBC: 4.46 x10E6/uL (ref 4.14–5.80)
RDW: 12.5 % (ref 11.6–15.4)
WBC: 5.9 10*3/uL (ref 3.4–10.8)

## 2018-05-09 ENCOUNTER — Other Ambulatory Visit: Payer: Self-pay

## 2018-05-09 ENCOUNTER — Ambulatory Visit (HOSPITAL_COMMUNITY)
Admission: RE | Admit: 2018-05-09 | Discharge: 2018-05-09 | Disposition: A | Discharge status: Home / Self Care | Payer: BC Managed Care – PPO | Attending: Cardiology | Admitting: Cardiology

## 2018-05-09 ENCOUNTER — Encounter (HOSPITAL_COMMUNITY): Payer: Self-pay | Admitting: *Deleted

## 2018-05-09 ENCOUNTER — Ambulatory Visit (HOSPITAL_COMMUNITY): Payer: BC Managed Care – PPO | Admitting: Registered Nurse

## 2018-05-09 ENCOUNTER — Encounter (HOSPITAL_COMMUNITY)
Admission: RE | Disposition: A | Discharge status: Home / Self Care | Payer: Self-pay | Source: Home / Self Care | Attending: Cardiology

## 2018-05-09 ENCOUNTER — Ambulatory Visit (INDEPENDENT_AMBULATORY_CARE_PROVIDER_SITE_OTHER): Payer: BC Managed Care – PPO | Admitting: *Deleted

## 2018-05-09 DIAGNOSIS — Z7901 Long term (current) use of anticoagulants: Secondary | ICD-10-CM | POA: Insufficient documentation

## 2018-05-09 DIAGNOSIS — Z8249 Family history of ischemic heart disease and other diseases of the circulatory system: Secondary | ICD-10-CM | POA: Diagnosis not present

## 2018-05-09 DIAGNOSIS — I428 Other cardiomyopathies: Secondary | ICD-10-CM | POA: Diagnosis not present

## 2018-05-09 DIAGNOSIS — Z79899 Other long term (current) drug therapy: Secondary | ICD-10-CM | POA: Diagnosis not present

## 2018-05-09 DIAGNOSIS — G4733 Obstructive sleep apnea (adult) (pediatric): Secondary | ICD-10-CM | POA: Insufficient documentation

## 2018-05-09 DIAGNOSIS — I4891 Unspecified atrial fibrillation: Secondary | ICD-10-CM | POA: Diagnosis present

## 2018-05-09 DIAGNOSIS — I482 Chronic atrial fibrillation, unspecified: Secondary | ICD-10-CM | POA: Diagnosis not present

## 2018-05-09 DIAGNOSIS — Z5181 Encounter for therapeutic drug level monitoring: Secondary | ICD-10-CM | POA: Diagnosis not present

## 2018-05-09 DIAGNOSIS — Z823 Family history of stroke: Secondary | ICD-10-CM | POA: Diagnosis not present

## 2018-05-09 HISTORY — PX: CARDIOVERSION: SHX1299

## 2018-05-09 LAB — POCT INR: INR: 2.6 (ref 2.0–3.0)

## 2018-05-09 SURGERY — CARDIOVERSION
Anesthesia: General

## 2018-05-09 MED ORDER — PROPOFOL 10 MG/ML IV BOLUS
INTRAVENOUS | Status: DC | PRN
  Administered 2018-05-09: 100 mg via INTRAVENOUS

## 2018-05-09 MED ORDER — LIDOCAINE 2% (20 MG/ML) 5 ML SYRINGE
INTRAMUSCULAR | Status: DC | PRN
  Administered 2018-05-09: 100 mg via INTRAVENOUS

## 2018-05-09 MED ORDER — SODIUM CHLORIDE 0.9 % IV SOLN
INTRAVENOUS | Status: DC
  Administered 2018-05-09: 11:00:00 via INTRAVENOUS

## 2018-05-09 NOTE — Transfer of Care (Signed)
Immediate Anesthesia Transfer of Care Note  Patient: Ruben [name]  Procedure(s) Performed: CARDIOVERSION (N/A )  Patient Location: PACU and Endoscopy Unit  Anesthesia Type:General  Level of Consciousness: drowsy  Airway & Oxygen Therapy: Patient Spontanous Breathing  Post-op Assessment: Report given to RN and Post -op Vital signs reviewed and stable  Post vital signs: Reviewed and stable  Last Vitals:  Vitals Value Taken Time  BP    Temp    Pulse    Resp    SpO2      Last Pain:  Vitals:   05/09/18 1010  TempSrc: Oral  PainSc: 0-No pain         Complications: No apparent anesthesia complications

## 2018-05-09 NOTE — Discharge Instructions (Signed)
Electrical Cardioversion, Care After °This sheet gives you information about how to care for yourself after your procedure. Your health care provider may also give you more specific instructions. If you have problems or questions, contact your health care provider. °What can I expect after the procedure? °After the procedure, it is common to have: °· Some redness on the skin where the shocks were given. °Follow these instructions at home: ° °· Do not drive for 24 hours if you were given a medicine to help you relax (sedative). °· Take over-the-counter and prescription medicines only as told by your health care provider. °· Ask your health care provider how to check your pulse. Check it often. °· Rest for 48 hours after the procedure or as told by your health care provider. °· Avoid or limit your caffeine use as told by your health care provider. °Contact a health care provider if: °· You feel like your heart is beating too quickly or your pulse is not regular. °· You have a serious muscle cramp that does not go away. °Get help right away if: ° °· You have discomfort in your chest. °· You are dizzy or you feel faint. °· You have trouble breathing or you are short of breath. °· Your speech is slurred. °· You have trouble moving an arm or leg on one side of your body. °· Your fingers or toes turn cold or blue. °This information is not intended to replace advice given to you by your health care provider. Make sure you discuss any questions you have with your health care provider. °Document Released: 01/25/2013 Document Revised: 11/08/2015 Document Reviewed: 10/11/2015 °Elsevier Interactive Patient Education © 2019 [employer] ° °

## 2018-05-09 NOTE — Interval H&P Note (Signed)
History and Physical Interval Note:  05/09/2018 10:41 AM  Ruben [name]  has presented today for surgery, with the diagnosis of AFIB  The various methods of treatment have been discussed with the patient and family. After consideration of risks, benefits and other options for treatment, the patient has consented to  Procedure(s): CARDIOVERSION (N/A) as a surgical intervention .  The patient's history has been reviewed, patient examined, no change in status, stable for surgery.  I have reviewed the patient's chart and labs.  Questions were answered to the patient's satisfaction.     Chyanna Flock Cristal Deer

## 2018-05-09 NOTE — Anesthesia Postprocedure Evaluation (Signed)
Anesthesia Post Note  Patient: Ruben [name]  Procedure(s) Performed: CARDIOVERSION (N/A )     Patient location during evaluation: Endoscopy Anesthesia Type: General Level of consciousness: awake and alert Pain management: pain level controlled Vital Signs Assessment: post-procedure vital signs reviewed and stable Respiratory status: spontaneous breathing, nonlabored ventilation, respiratory function stable and patient connected to nasal cannula oxygen Cardiovascular status: stable and blood pressure returned to baseline Postop Assessment: no apparent nausea or vomiting Anesthetic complications: no    Last Vitals:  Vitals:   05/09/18 1110 05/09/18 1120  BP: 102/71 94/68  Pulse: (!) 59 (!) 51  Resp: 12 12  Temp:    SpO2: 99% 98%    Last Pain:  Vitals:   05/09/18 1120  TempSrc:   PainSc: 0-No pain                 Trevor Iha

## 2018-05-09 NOTE — Anesthesia Procedure Notes (Signed)
Date/Time: 05/09/2018 10:52 AM Performed by: Laruth Bouchard., CRNA Pre-anesthesia Checklist: Patient identified, Emergency Drugs available, Suction available, Patient being monitored and Timeout performed Patient Re-evaluated:Patient Re-evaluated prior to induction Oxygen Delivery Method: Ambu bag Preoxygenation: Pre-oxygenation with 100% oxygen Induction Type: IV induction Placement Confirmation: positive ETCO2

## 2018-05-09 NOTE — Patient Instructions (Signed)
Description   Continue taking 2 tablets daily.  Recheck INR in 10 days after cardioversion.  Call us with any concerns or changes. # [id number]- [id number]

## 2018-05-09 NOTE — CV Procedure (Signed)
Procedure:   DCCV  Indication:  Symptomatic atrial fibrillation  Procedure Note:  The patient signed informed consent.  He has had had therapeutic anticoagulation with coumadin greater than 3 weeks.  Anesthesia was administered by Dr. Richardson Landry.  Adequate airway was maintained throughout and vital followed per protocol.  He was cardioverted x 2 with 150 J then 200 J of biphasic synchronized energy.  He converted to sinus bradycardia.  There were no apparent complications.  The patient had normal neuro status and respiratory status post procedure with vitals stable as recorded elsewhere.    Follow up:  He has follow up with cardiology.  He will continue on current medical therapy.    Jodelle Red, MD PhD 05/09/2018 10:56 AM

## 2018-05-09 NOTE — Anesthesia Preprocedure Evaluation (Addendum)
Anesthesia Evaluation  Patient identified by MRN, date of birth, ID band Patient awake    Reviewed: Allergy & Precautions, H&P , NPO status , Patient's Chart, lab work & pertinent test results  Airway Mallampati: II  TM Distance: >3 FB Neck ROM: Full    Dental no notable dental hx. (+) Teeth Intact, Dental Advisory Given   Pulmonary sleep apnea ,    Pulmonary exam normal breath sounds clear to auscultation       Cardiovascular Normal cardiovascular exam+ dysrhythmias Atrial Fibrillation  Rhythm:Regular Rate:Normal  Echo 06/01/16 Left ventricle: The cavity size was moderately dilated. Wall   thickness was increased in a pattern of mild LVH. Systolic   function was normal. The estimated ejection fraction was in the   range of 55% to 60%. The study is not technically sufficient to   allow evaluation of LV diastolic function. - Mitral valve: There was mild regurgitation. - Left atrium: The atrium was moderately dilated.    Neuro/Psych negative neurological ROS  negative psych ROS   GI/Hepatic negative GI ROS, Neg liver ROS,   Endo/Other  negative endocrine ROS  Renal/GU negative Renal ROS     Musculoskeletal negative musculoskeletal ROS (+)   Abdominal   Peds  Hematology   Anesthesia Other Findings   Reproductive/Obstetrics                            Lab Results  Component Value Date   WBC 5.9 05/06/2018   HGB 13.6 05/06/2018   HCT 40.4 05/06/2018   MCV 91 05/06/2018   PLT 313 05/06/2018    Anesthesia Physical Anesthesia Plan  ASA: III  Anesthesia Plan: General   Post-op Pain Management:  Regional for Post-op pain   Induction: Intravenous  PONV Risk Score and Plan: 3 and Treatment may vary due to age or medical condition, Ondansetron and Dexamethasone  Airway Management Planned: Nasal Cannula and Natural Airway  Additional Equipment:   Intra-op Plan:   Post-operative  Plan:   Informed Consent: I have reviewed the patients History and Physical, chart, labs and discussed the procedure including the risks, benefits and alternatives for the proposed anesthesia with the patient or authorized representative who has indicated his/her understanding and acceptance.     Dental advisory given  Plan Discussed with: CRNA  Anesthesia Plan Comments:         Anesthesia Quick Evaluation

## 2018-05-11 ENCOUNTER — Encounter (HOSPITAL_COMMUNITY): Payer: Self-pay | Admitting: Cardiology

## 2018-05-19 ENCOUNTER — Ambulatory Visit (INDEPENDENT_AMBULATORY_CARE_PROVIDER_SITE_OTHER): Payer: BC Managed Care – PPO | Admitting: Pharmacist

## 2018-05-19 ENCOUNTER — Ambulatory Visit (HOSPITAL_COMMUNITY)
Admission: RE | Admit: 2018-05-19 | Discharge: 2018-05-19 | Disposition: A | Discharge status: Home / Self Care | Payer: BC Managed Care – PPO | Source: Ambulatory Visit | Attending: Nurse Practitioner | Admitting: Nurse Practitioner

## 2018-05-19 VITALS — BP 106/68 | HR 45 | Ht 76.0 in | Wt 271.0 lb

## 2018-05-19 DIAGNOSIS — Z8249 Family history of ischemic heart disease and other diseases of the circulatory system: Secondary | ICD-10-CM | POA: Diagnosis not present

## 2018-05-19 DIAGNOSIS — Z7901 Long term (current) use of anticoagulants: Secondary | ICD-10-CM | POA: Diagnosis not present

## 2018-05-19 DIAGNOSIS — I340 Nonrheumatic mitral (valve) insufficiency: Secondary | ICD-10-CM | POA: Diagnosis not present

## 2018-05-19 DIAGNOSIS — Z5181 Encounter for therapeutic drug level monitoring: Secondary | ICD-10-CM | POA: Diagnosis not present

## 2018-05-19 DIAGNOSIS — Z8042 Family history of malignant neoplasm of prostate: Secondary | ICD-10-CM | POA: Insufficient documentation

## 2018-05-19 DIAGNOSIS — Z79899 Other long term (current) drug therapy: Secondary | ICD-10-CM | POA: Diagnosis not present

## 2018-05-19 DIAGNOSIS — R001 Bradycardia, unspecified: Secondary | ICD-10-CM | POA: Insufficient documentation

## 2018-05-19 DIAGNOSIS — I4819 Other persistent atrial fibrillation: Secondary | ICD-10-CM

## 2018-05-19 DIAGNOSIS — R9431 Abnormal electrocardiogram [ECG] [EKG]: Secondary | ICD-10-CM | POA: Diagnosis not present

## 2018-05-19 DIAGNOSIS — I428 Other cardiomyopathies: Secondary | ICD-10-CM | POA: Insufficient documentation

## 2018-05-19 DIAGNOSIS — G4733 Obstructive sleep apnea (adult) (pediatric): Secondary | ICD-10-CM | POA: Insufficient documentation

## 2018-05-19 LAB — POCT INR: INR: 2.8 (ref 2.0–3.0)

## 2018-05-19 NOTE — Patient Instructions (Signed)
Description   Continue taking 2 tablets daily. Recheck INR in 4 weeks.  Call us with any concerns or changes. # [id number]- [id number]

## 2018-05-19 NOTE — Progress Notes (Signed)
Primary Care Physician: Verlon Au, MD Referring Physician: Dr. Melburn Popper EP: Dr. Mariea Clonts is a 68 y.o. male with a h/o NICM and afib. He was hospitalized in February 2012 and was critically ill with severe systolic left ventricular dysfunction and atrial fibrillation. He was successfully cardioverted back to sinus rhythm in May 2012. He did well until August 2014 when he went back into atrial fibrillation after  Amiodarone reduced to 100 mg daily. His dose was increased and he was cardioverted him successfully on 03/02/13. Since then he has maintained normal sinus rhythm. His echocardiogram in 01/14/13 showed that his ejection fraction had improved to 50-55%.  Due to concerns of long term complications of amiodarone, the dose of drug was decreased, but with subsequent return of atrial fibrillation. He  was in the afib clinic 05/22/16 to discuss options. He is fairly comfortable in afib, and is rate controlled at rest, but does notice that his heart rate will increase to 120 bpm with exertion.  He does not drink alcohol, no excessive caffeine, no tobacco. He is obese, does not exercise and admits that he eats a lot of junk food. He does have OSA and uses CPAP.  He eventually had amiodarone washout and was hospitalized in April for tikosyn.  F/u afib clinic,from tikosyn loading, 4/16-1/19.Marland Kitchen He did well with stable qtc, remains stable today at 446 ms. He is slow today at 49 bpm but not symptomatic.He feels better in SR with more energy.  Being seen in afib clinic 04/27/17. He has had persistent  Afib 12/19, notices more fatigue. He is interested in having cardioversion. No known triggers.  F/u in afib clinic 04/15/18. He felt that he went into afib last weekend. He did this back in late December 2018 /early January 2019  and went back into NSR while awaiting to get cardioverted. He has not missed any tikosyn. He is rate controlled. 114 bpm on EKG but with quiet sitting,  his HR's were in the 60/70's. He will need weekly INR's.  F/u 05/19/2018,he had successful cardioversion after 4 in range INR's and remains in sinus brady at 45 bpm. He feels well. His BB had been increased for  afib rate control while out of rhythm. He will go back to 6.25 mg bid. He feels improved.  Today, he denies symptoms of  chest pain, orthopnea, PND, lower extremity edema, dizziness, presyncope, syncope, or neurologic sequela.   The patient is tolerating medications without difficulties and is otherwise without complaint today.   Past Medical History:  Diagnosis Date  . Atrial fibrillation (HCC)   . Cough   . Non-ischemic cardiomyopathy (HCC)       . OSA (obstructive sleep apnea)    Past Surgical History:  Procedure Laterality Date  . CARDIOVERSION N/A 03/02/2013   Procedure: CARDIOVERSION;  Surgeon: [name], MD;  Location: [facility] ENDOSCOPY;  Service: Cardiovascular;  Laterality: N/A;  . CARDIOVERSION N/A 08/05/2016   Procedure: CARDIOVERSION;  Surgeon: Chilton Si, MD;  Location: [facility] ENDOSCOPY;  Service: Cardiovascular;  Laterality: N/A;  . CARDIOVERSION N/A 05/09/2018   Procedure: CARDIOVERSION;  Surgeon: Jodelle Red, MD;  Location: [facility] ENDOSCOPY;  Service: Cardiovascular;  Laterality: N/A;  . FOOT FRACTURE SURGERY    . PILONIDAL CYST EXCISION      Current Outpatient Medications  Medication Sig Dispense Refill  . acetaminophen (TYLENOL) 500 MG tablet Take 1,000 mg by mouth every 8 (eight) hours as needed for mild pain or headache.    Marland Kitchen  carvedilol (COREG) 12.5 MG tablet Take 12.5 mg by mouth 2 (two) times daily with a meal.    . dofetilide (TIKOSYN) 500 MCG capsule TAKE 1 CAPSULE BY MOUTH TWICE DAILY 180 capsule 2  . hydrALAZINE (APRESOLINE) 25 MG tablet TAKE 0.5 TABLET (12.5 mg)  BY MOUTH THREE TIMES DAILY. (Patient taking differently: Take 12.5 mg by mouth 3 (three) times daily. TAKE 0.5 TABLET (12.5 mg)  BY MOUTH THREE TIMES DAILY.) 135 tablet 3  .  lisinopril (PRINIVIL,ZESTRIL) 5 MG tablet TAKE 1 TABLET BY MOUTH ONCE DAILY 90 tablet 3  . magnesium oxide (MAG-OX) 400 (241.3 Mg) MG tablet TAKE 1 TABLET BY MOUTH TWICE DAILY 60 tablet 6  . pravastatin (PRAVACHOL) 20 MG tablet Take 20 mg by mouth daily.    Marland Kitchen. warfarin (COUMADIN) 2 MG tablet TAKE AS DIRECTED BY  COUMADIN  CLINIC 65 tablet 2  . guaiFENesin (MUCINEX) 600 MG 12 hr tablet Take 600 mg by mouth 2 (two) times daily as needed for cough or to loosen phlegm.     No current facility-administered medications for this encounter.     No Known Allergies  Social History   Socioeconomic History  . Marital status: Married    Spouse name: Not on file  . Number of children: Not on file  . Years of education: Not on file  . Highest education level: Not on file  Occupational History  . Not on file  Social Needs  . Financial resource strain: Not on file  . Food insecurity:    Worry: Not on file    Inability: Not on file  . Transportation needs:    Medical: Not on file    Non-medical: Not on file  Tobacco Use  . Smoking status: Never Smoker  . Smokeless tobacco: Never Used  Substance and Sexual Activity  . Alcohol use: No  . Drug use: No  . Sexual activity: Yes    Birth control/protection: Other-see comments  Lifestyle  . Physical activity:    Days per week: Not on file    Minutes per session: Not on file  . Stress: Not on file  Relationships  . Social connections:    Talks on phone: Not on file    Gets together: Not on file    Attends religious service: Not on file    Active member of club or organization: Not on file    Attends meetings of clubs or organizations: Not on file    Relationship status: Not on file  . Intimate partner violence:    Fear of current or ex partner: Not on file    Emotionally abused: Not on file    Physically abused: Not on file    Forced sexual activity: Not on file  Other Topics Concern  . Not on file  Social History Narrative  . Not on  file    Family History  Problem Relation Age of Onset  . Cancer Mother   . Stroke Mother   . Hypertension Mother   . Memory loss Mother   . Prostate cancer Father     ROS- All systems are reviewed and negative except as per the HPI above  Physical Exam: Vitals:   05/19/18 0836  BP: 106/68  Pulse: (!) 45  Weight: 122.9 kg  Height: 6\' 4"  (1.93 m)   Wt Readings from Last 3 Encounters:  05/19/18 122.9 kg  05/09/18 122.5 kg  04/15/18 122.5 kg    Labs: Lab Results  Component Value Date  NA 139 05/06/2018   K 4.4 05/06/2018   CL 104 05/06/2018   CO2 21 05/06/2018   GLUCOSE 112 (H) 05/06/2018   BUN 13 05/06/2018   CREATININE 1.09 05/06/2018   CALCIUM 8.9 05/06/2018   PHOS 3.9 05/09/2010   MG 1.9 04/15/2018   Lab Results  Component Value Date   INR 2.6 05/09/2018   Lab Results  Component Value Date   CHOL 159 02/14/2018   HDL 41 02/14/2018   LDLCALC 91 02/14/2018   TRIG 134 02/14/2018     GEN- The patient is well appearing, alert and oriented x 3 today.   Head- normocephalic, atraumatic Eyes-  Sclera clear, conjunctiva pink Ears- hearing intact Oropharynx- clear Neck- supple, no JVP Lymph- no cervical lymphadenopathy Lungs- Clear to ausculation bilaterally, normal work of breathing Heart-  irregular rate and rhythm, no murmurs, rubs or gallops, PMI not laterally displaced GI- soft, NT, ND, + BS Extremities- no clubbing, cyanosis, or edema MS- no significant deformity or atrophy Skin- no rash or lesion Psych- euthymic mood, full affect Neuro- strength and sensation are intact  EKG- sinus brady at 45 bpm, pr int 188 ms, qrs int 94 ms, qtc 451 ms Echo-2014-Study Conclusions  - Left ventricle: The cavity size was normal. There was mild focal basal hypertrophy of the septum. Systolic function was normal. The estimated ejection fraction was in the range of 50% to 55%. Wall motion was normal; there were no regional wall motion abnormalities. -  Ascending aorta: The ascending aorta was mildly dilated. - Mitral valve: Mild prolapse, involving the anterior leaflet and the posterior leaflet. Mild regurgitation. - Left atrium: The atrium was mildly dilated. - Right atrium: The atrium was mildly dilated. INR- 1.7, 2/27     Assessment and Plan: 1. Afib On tikosyn since April 2018, with return of afib 4-6 weeks ago Now in S brady  Decrease coreg to 1/2 tab of 12.5 mg bid  He is on warfarin and INR is due today  2. Lifestyle issues Encouraged healthier diet for weight loss Continue to use CPAP Regular exercise encouraged  2. H/o NICM Echo updated 06/01/16- EF 55-60%  F/u with Dr. [name], next appointment in April and sees him 2x a year, he will need bmet/mag and EKG for qt length at these visits for dofetilide surveillance afib clinic as needed  Lupita Leash C. [facility] [address] [place], [place] [address] [phone]

## 2018-06-11 ENCOUNTER — Other Ambulatory Visit (HOSPITAL_COMMUNITY): Payer: Self-pay | Admitting: Nurse Practitioner

## 2018-06-16 ENCOUNTER — Ambulatory Visit (INDEPENDENT_AMBULATORY_CARE_PROVIDER_SITE_OTHER): Payer: BC Managed Care – PPO | Admitting: *Deleted

## 2018-06-16 DIAGNOSIS — Z5181 Encounter for therapeutic drug level monitoring: Secondary | ICD-10-CM | POA: Diagnosis not present

## 2018-06-16 LAB — POCT INR: INR: 2.5 (ref 2.0–3.0)

## 2018-06-16 NOTE — Patient Instructions (Signed)
Description   Continue taking 2 tablets daily. Recheck INR in 6 weeks.  Call us with any concerns or changes. # [id number]- [id number]

## 2018-07-09 ENCOUNTER — Other Ambulatory Visit (HOSPITAL_COMMUNITY): Payer: Self-pay | Admitting: Nurse Practitioner

## 2018-07-09 ENCOUNTER — Other Ambulatory Visit: Payer: Self-pay | Admitting: Cardiovascular Disease

## 2018-07-12 ENCOUNTER — Telehealth: Payer: Self-pay

## 2018-07-12 NOTE — Telephone Encounter (Signed)
**Note De-Identified Bartt Gonzaga Obfuscation** Letter received from [facility] vis fax stating that they have approved the pts Dofetilide PA. Approval good from 07/12/2018 until 07/12/2019  I have notified the pts pharmacy of this approval.

## 2018-07-12 NOTE — Telephone Encounter (Signed)
We received a PA request form via fax from [facility] concerning the pts Dofetilide. I have completed the form, Dr Ladona Ridgel has signed it and I have faxed it back to [facility].

## 2018-07-19 ENCOUNTER — Telehealth: Payer: Self-pay | Admitting: Nurse Practitioner

## 2018-07-19 DIAGNOSIS — I4819 Other persistent atrial fibrillation: Secondary | ICD-10-CM

## 2018-07-19 DIAGNOSIS — Z79899 Other long term (current) drug therapy: Secondary | ICD-10-CM

## 2018-07-19 DIAGNOSIS — E781 Pure hyperglyceridemia: Secondary | ICD-10-CM

## 2018-07-19 DIAGNOSIS — E782 Mixed hyperlipidemia: Secondary | ICD-10-CM

## 2018-07-19 NOTE — Telephone Encounter (Signed)
      Cardiac Questionnaire:    Since your last visit or hospitalization:    1. Have you been having new or worsening chest pain? No   2. Have you been having new or worsening shortness of breath? No 3. Have you been having new or worsening leg swelling, wt gain, or increase in abdominal girth (pants fitting more tightly)? No   4. Have you had any passing out spells? No  F/u with Dr. [name], next appointment in April and sees him 2x a year, he will need bmet/mag and EKG for qt length at these visits for dofetilide surveillance afib clinic as needed    *A YES to any of these questions would result in the appointment being kept. *If all the answers to these questions are NO, we should indicate that given the current situation regarding the worldwide coronarvirus pandemic, at the recommendation of the CDC, we are looking to limit gatherings in our waiting area, and thus will reschedule their appointment beyond four weeks from today.   _____________   COVID-19 Pre-Screening Questions:  . Do you currently have a fever? No (yes = cancel and refer to pcp for e-visit) . Have you recently travelled on a cruise, internationally, or to [place], [place], [place], [place], [place], or [place], [place] [employer]) ? No (yes = cancel, stay home, monitor symptoms, and contact pcp or initiate e-visit if symptoms develop) . Have you been in contact with someone that is currently pending confirmation of Covid19 testing or has been confirmed to have the Covid19 virus?  No (yes = cancel, stay home, away from tested individual, monitor symptoms, and contact pcp or initiate e-visit if symptoms develop) . Are you currently experiencing fatigue or cough? No (yes = pt should be prepared to have a mask placed at the time of their visit).     Primary Cardiologist: [name], MD   Pt contacted.  History and symptoms reviewed.  Pt will f/u with [facility] provider as scheduled.  Pt. advised that we are restricting visitors at this time  and request that only patients present for check-in prior to their appointment.  All other visitors should remain in their car.  If necessary, only one visitor may come with the patient, into the building. For everyone's safety, all patients and visitor entering our practice area should expect to be screened again prior to entering our waiting area.  Levi Aland, RN  07/19/2018 9:35 AM

## 2018-07-29 ENCOUNTER — Telehealth: Payer: Self-pay

## 2018-07-29 NOTE — Telephone Encounter (Signed)

## 2018-07-29 NOTE — Telephone Encounter (Signed)
lmom for prescreen  

## 2018-07-31 NOTE — Progress Notes (Signed)
Cardiology Office Note   Date:  08/01/2018   ID:  COURTENAY CREGER, DOB December 06, 1950, MRN [id number]  PCP:  Verlon Au, MD  Cardiologist:  previous [name] MD,  [name], MD   No chief complaint on file.  Problem list 1. 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  Atrial fibrillation 3. Obesity 4. Hyperlipidemia 5. Obstructive sleep apnea 6. Mitral valve prolapse - mild MR by echo Sept. 2014    [name] - notes from Bensyn Bornemann is a 68 y.o. male who presents for Six-month follow-up visit    He has a past history of nonischemic dilated cardiomyopathy and associated atrial fibrillation at that time. He was hospitalized in February 2012 and was critically ill with severe systolic left ventricular dysfunction and atrial fibrillation. He was successfully cardioverted back to sinus rhythm in May 2012. He did well until August 2014 when he went back into atrial fibrillation after we had cut back his amiodarone to just 100 mg daily. His dose was increased and we cardioverted him successfully on 03/02/13. Since then he has maintained normal sinus rhythm. His echocardiogram in 01/14/13 showed that his ejection fraction had improved to 50-55%. Since last visit has had no new cardiac symptoms. He has a history of sleep apnea and uses a CPAP machine successfully.  The patient has a history of hypercholesterolemia. He is not experiencing any myalgias. Recent lab work includes normal hepatic function and normal thyroid function  He has had a slight cough which is resolving on Mucinex.He has gained weight.  His family gave him a fit.bit  And he is trying to get more exercise to help him lose weight.  November 07, 2015:  Hx of a viral cardiomyopathy in 2011. Never got over a cold - progressive shortness of breath.  EF was 10% at one point  EF h [name] is doing well.  Seen for the first time today - transfer from  [place]  Works for the state - [employer] .   No regular exercise .    No Cp ,  Breathing is good.  Jan. 16, 2018  Doing well. Has had a cold recently. Has not been exercising .   Jan.  25th, 2018:  [name] is seen back today for a work in visit.  During his last visit we decreased his dose of amiodarone. He has gone back into atrial fibrillation.  He has been active,   This past Sunday , he noticed that he was more short of breath,  More fatigued.   Was seen in the hospital - found to be in atrial fib  He notices a faster HR on his Fit Bit.   Has not missed any doses of anticoagulant .    Has not tried any other antiarrhythmics.   12/23/2016: Doing well Breathing is going well.   Has fatigue in the evening  Had Afib earlier this year, Started on Tikosyn 500 BID  Labs last week  Potassium = 4.4 No mag level was drawn  Echo from Feb. 2018 shows normal LV EF   Aug 18, 2017:   [name] is seen today for follow-up of his atrial fibrillation and chronic systolic congestive heart failure. His A. fib has been well controlled on Tikosyn. Had an episode of AF after shoveling snow.  Lasted for a day or so   February 16, 2018: Is here to follow-up with his atrial fibrillation and chronic systolic congestive  heart failure.  His atrial fibrillation is well controlled on Tikosyn.  His magnesium and potassium levels were drawn several days ago and are normal.  Had a brief episode of PAF - lasted for a month and then went back into rhythm.  Has had a cough - has been taking mucinex  He thought he had some fluid retention. EF was normal .     August 01, 2018:   [name] presents today for follow-up of his chronic systolic congestive heart failure and atrial fibrillation.    The  fraction was as low as 10% percent previously.   Recent echocardiogram from February, 2018 reveals an ejection fraction of 55 to 60%. He has a history of atrial fibrillation.  He is controlled on  amiodarone but due to the long-term side effects of amiodarone he was changed to Tikosyn in April, 2019.   He was last seen by [name] in the atrial fibrillation clinic in January, 2020.  He has had some recurrent episodes of atrial fibrillation.  He was successfully cardioverted on May 19, 2018.  He is on chronic Coumadin therapy.  Has not had any further episodes of AF since that time . No CP , no dyspnea,  No fever, cough.   He scheduled for follow-up to see today for an EKG, basic metabolic panel and magnesium levels.   Past Medical History:  Diagnosis Date  . Atrial fibrillation (HCC)   . Cough   . Non-ischemic cardiomyopathy (HCC)       . OSA (obstructive sleep apnea)     Past Surgical History:  Procedure Laterality Date  . CARDIOVERSION N/A 03/02/2013   Procedure: CARDIOVERSION;  Surgeon: [name], MD;  Location[facility] ENDOSCOPY;  Service: Cardiovascular;  Laterality: N/A;  . CARDIOVERSION N/A 08/05/2016   Procedure: CARDIOVERSION;  Surgeon: Chilton Si, MD;  Location: [facility] ENDOSCOPY;  Service: Cardiovascular;  Laterality: N/A;  . CARDIOVERSION N/A 05/09/2018   Procedure: CARDIOVERSION;  Surgeon: Jodelle Red, MD;  Location: [facility] ENDOSCOPY;  Service: Cardiovascular;  Laterality: N/A;  . FOOT FRACTURE SURGERY    . PILONIDAL CYST EXCISION       Current Outpatient Medications  Medication Sig Dispense Refill  . acetaminophen (TYLENOL) 500 MG tablet Take 1,000 mg by mouth every 8 (eight) hours as needed for mild pain or headache.    . carvedilol (COREG) 12.5 MG tablet Take 1 tablet by mouth twice daily 180 tablet 2  . dofetilide (TIKOSYN) 500 MCG capsule TAKE 1 CAPSULE BY MOUTH TWICE DAILY 180 capsule 2  . guaiFENesin (MUCINEX) 600 MG 12 hr tablet Take 600 mg by mouth 2 (two) times daily as needed for cough or to loosen phlegm.    . hydrALAZINE (APRESOLINE) 25 MG tablet TAKE 0.5 TABLET (12.5 mg)  BY MOUTH THREE TIMES DAILY. 135 tablet 3  . lisinopril  (PRINIVIL,ZESTRIL) 5 MG tablet TAKE 1 TABLET BY MOUTH ONCE DAILY 90 tablet 3  . magnesium oxide (MAG-OX) 400 (241.3 Mg) MG tablet Take 1 tablet by mouth twice daily 60 tablet 0  . pravastatin (PRAVACHOL) 20 MG tablet Take 20 mg by mouth daily.    Marland Kitchen warfarin (COUMADIN) 2 MG tablet TAKE AS DIRECTED BY  COUMADIN  CLINIC 65 tablet 2   No current facility-administered medications for this visit.     Allergies:   Patient has no known allergies.    Social History:  The patient  reports that he has never smoked. He has never used smokeless tobacco. He reports that he does  not drink alcohol or use drugs.   Family History:  The patient's family history includes Cancer in his mother; Hypertension in his mother; Memory loss in his mother; Prostate cancer in his father; Stroke in his mother.    ROS:  Please see the history of present illness.   Otherwise, review of systems are positive for none.   All other systems are reviewed and negative.    Physical Exam: Blood pressure 108/66, pulse (!) 53, height 6\' 4"  (1.93 m), weight 266 lb 9.6 oz (120.9 kg), SpO2 95 %.  Physical Exam: Blood pressure 108/66, pulse (!) 53, height 6\' 4"  (1.93 m), weight 266 lb 9.6 oz (120.9 kg), SpO2 95 %.  GEN:  Well nourished, well developed in no acute distress HEENT: Normal NECK: No JVD; No carotid bruits LYMPHATICS: No lymphadenopathy CARDIAC: RRR ,  Soft systolic murmur  RESPIRATORY:  Clear to auscultation without rales, wheezing or rhonchi  ABDOMEN: Soft, non-tender, non-distended MUSCULOSKELETAL:  No edema; No deformity  SKIN: Warm and dry NEUROLOGIC:  Alert and oriented x 3   EKG:  August 01, 2018:   Sinus brady at 16.   QTc is 435 ms. .  Recent Labs: 02/14/2018: ALT 16 04/15/2018: Magnesium 1.9 05/06/2018: BUN 13; Creatinine, Ser 1.09; Hemoglobin 13.6; Platelets 313; Potassium 4.4; Sodium 139    Lipid Panel    Component Value Date/Time   CHOL 159 02/14/2018 0755   TRIG 134 02/14/2018 0755   HDL 41  02/14/2018 0755   CHOLHDL 3.9 02/14/2018 0755   CHOLHDL 3.4 11/05/2015 0746   VLDL 31 (H) 11/05/2015 0746   LDLCALC 91 02/14/2018 0755   LDLDIRECT 90.2 12/30/2012 0740      Wt Readings from Last 3 Encounters:  08/01/18 266 lb 9.6 oz (120.9 kg)  05/19/18 271 lb (122.9 kg)  05/09/18 270 lb (122.5 kg)      ASSESSMENT AND PLAN:  1.  Chronic combined S/D  CHF :  EF has improved.  Continue current .   2. Persistant  atrial fibrillation,  Is in  NSR .   QTc is  435 . Continue current dose of Tikosyn.   3. Dyslipidemia.   Cont meds.  Check labs today  Cont. pravachol  Recheck in 6 months .   4.   Mitral valve prolapse:   Mild MR.  Stable     [name], MD  08/01/2018 9:20 AM    [facility] [address] [place],  [address] [place], [place]  [address] Pager [phone] Phone: [phone]; Fax: [phone]

## 2018-08-01 ENCOUNTER — Ambulatory Visit: Payer: BC Managed Care – PPO | Admitting: Cardiovascular Disease

## 2018-08-01 ENCOUNTER — Encounter: Payer: Self-pay | Admitting: Cardiovascular Disease

## 2018-08-01 ENCOUNTER — Ambulatory Visit (INDEPENDENT_AMBULATORY_CARE_PROVIDER_SITE_OTHER): Payer: BC Managed Care – PPO | Admitting: Cardiovascular Disease

## 2018-08-01 ENCOUNTER — Other Ambulatory Visit: Payer: Self-pay

## 2018-08-01 ENCOUNTER — Ambulatory Visit (INDEPENDENT_AMBULATORY_CARE_PROVIDER_SITE_OTHER): Payer: BC Managed Care – PPO | Admitting: *Deleted

## 2018-08-01 ENCOUNTER — Other Ambulatory Visit: Payer: BC Managed Care – PPO | Admitting: *Deleted

## 2018-08-01 VITALS — BP 108/66 | HR 53 | Ht 76.0 in | Wt 266.6 lb

## 2018-08-01 DIAGNOSIS — I4819 Other persistent atrial fibrillation: Secondary | ICD-10-CM

## 2018-08-01 DIAGNOSIS — E785 Hyperlipidemia, unspecified: Secondary | ICD-10-CM

## 2018-08-01 DIAGNOSIS — Z79899 Other long term (current) drug therapy: Secondary | ICD-10-CM

## 2018-08-01 DIAGNOSIS — E782 Mixed hyperlipidemia: Secondary | ICD-10-CM | POA: Diagnosis not present

## 2018-08-01 DIAGNOSIS — I480 Paroxysmal atrial fibrillation: Secondary | ICD-10-CM | POA: Diagnosis not present

## 2018-08-01 DIAGNOSIS — I5022 Chronic systolic (congestive) heart failure: Secondary | ICD-10-CM

## 2018-08-01 DIAGNOSIS — E781 Pure hyperglyceridemia: Secondary | ICD-10-CM

## 2018-08-01 DIAGNOSIS — Z7189 Other specified counseling: Secondary | ICD-10-CM

## 2018-08-01 DIAGNOSIS — Z5181 Encounter for therapeutic drug level monitoring: Secondary | ICD-10-CM | POA: Diagnosis not present

## 2018-08-01 LAB — HEPATIC FUNCTION PANEL
ALT: 19 IU/L (ref 0–44)
AST: 17 IU/L (ref 0–40)
Albumin: 4.5 g/dL (ref 3.8–4.8)
Alkaline Phosphatase: 100 IU/L (ref 39–117)
Bilirubin Total: 0.6 mg/dL (ref 0.0–1.2)
Bilirubin, Direct: 0.13 mg/dL (ref 0.00–0.40)
Total Protein: 7 g/dL (ref 6.0–8.5)

## 2018-08-01 LAB — BASIC METABOLIC PANEL
BUN/Creatinine Ratio: 18 (ref 10–24)
BUN: 20 mg/dL (ref 8–27)
CO2: 21 mmol/L (ref 20–29)
Calcium: 9.2 mg/dL (ref 8.6–10.2)
Chloride: 101 mmol/L (ref 96–106)
Creatinine, Ser: 1.09 mg/dL (ref 0.76–1.27)
GFR calc Af Amer: 81 mL/min/{1.73_m2} (ref >59)
GFR calc non Af Amer: 70 mL/min/{1.73_m2} (ref >59)
Glucose: 103 mg/dL — ABNORMAL HIGH (ref 65–99)
Potassium: 4.5 mmol/L (ref 3.5–5.2)
Sodium: 139 mmol/L (ref 134–144)

## 2018-08-01 LAB — LIPID PANEL
Chol/HDL Ratio: 4.1 ratio (ref 0.0–5.0)
Cholesterol, Total: 180 mg/dL (ref 100–199)
HDL: 44 mg/dL (ref >39)
LDL Calculated: 104 mg/dL — ABNORMAL HIGH (ref 0–99)
Triglycerides: 159 mg/dL — ABNORMAL HIGH (ref 0–149)
VLDL Cholesterol Cal: 32 mg/dL (ref 5–40)

## 2018-08-01 LAB — MAGNESIUM: Magnesium: 2.1 mg/dL (ref 1.6–2.3)

## 2018-08-01 LAB — POCT INR: INR: 2.2 (ref 2.0–3.0)

## 2018-08-01 NOTE — Patient Instructions (Signed)
Medication Instructions:  Your physician recommends that you continue on your current medications as directed. Please refer to the Current Medication list given to you today.  If you need a refill on your cardiac medications before your next appointment, please call your pharmacy.   Lab work: TODAY - cholesterol, liver panel, basic metabolic panel, magnesium  If you have labs (blood work) drawn today and your tests are completely normal, you will receive your results only by: Marland Kitchen MyChart Message (if you have MyChart) OR . A paper copy in the mail If you have any lab test that is abnormal or we need to change your treatment, we will call you to review the results.   Testing/Procedures: None Ordered   Follow-Up: At [facility], you and your health needs are our priority.  As part of our continuing mission to provide you with exceptional heart care, we have created designated Provider Care Teams.  These Care Teams include your primary Cardiologist (physician) and Advanced Practice Providers (APPs -  Physician Assistants and Nurse Practitioners) who all work together to provide you with the care you need, when you need it. You will need a follow up appointment in:  6 months.  Please call our office 2 months in advance to schedule this appointment.  You may see [name], MD or one of the following Advanced Practice Providers on your designated Care Team: Tereso Newcomer, PA-C [place], [place] . Berton Bon, NP

## 2018-08-01 NOTE — Patient Instructions (Signed)
Description   Spoke with pt and instructed pt to continue taking 2 tablets daily. Recheck INR in 8 weeks.  Call us with any concerns or changes. # [phone]

## 2018-08-06 ENCOUNTER — Other Ambulatory Visit (HOSPITAL_COMMUNITY): Payer: Self-pay | Admitting: Nurse Practitioner

## 2018-08-06 ENCOUNTER — Other Ambulatory Visit: Payer: Self-pay | Admitting: Cardiovascular Disease

## 2018-08-20 ENCOUNTER — Other Ambulatory Visit: Payer: Self-pay | Admitting: Cardiovascular Disease

## 2018-09-17 ENCOUNTER — Other Ambulatory Visit: Payer: Self-pay | Admitting: Internal Medicine

## 2018-09-21 ENCOUNTER — Telehealth: Payer: Self-pay

## 2018-09-21 NOTE — Telephone Encounter (Signed)
lmom for prescreen  

## 2018-09-23 NOTE — Telephone Encounter (Signed)

## 2018-09-26 ENCOUNTER — Other Ambulatory Visit: Payer: Self-pay

## 2018-09-26 ENCOUNTER — Ambulatory Visit (INDEPENDENT_AMBULATORY_CARE_PROVIDER_SITE_OTHER): Payer: BC Managed Care – PPO | Admitting: *Deleted

## 2018-09-26 DIAGNOSIS — Z5181 Encounter for therapeutic drug level monitoring: Secondary | ICD-10-CM

## 2018-09-26 LAB — POCT INR: INR: 2 (ref 2.0–3.0)

## 2018-09-26 NOTE — Patient Instructions (Addendum)
Description    Instructed pt to continue taking 2 tablets daily. Recheck INR in 8 weeks.  Call us with any concerns or changes. # [phone] [phone]

## 2018-10-31 ENCOUNTER — Other Ambulatory Visit: Payer: Self-pay | Admitting: Cardiovascular Disease

## 2018-11-18 ENCOUNTER — Telehealth: Payer: Self-pay | Admitting: *Deleted

## 2018-11-18 NOTE — Telephone Encounter (Signed)

## 2018-11-21 ENCOUNTER — Other Ambulatory Visit: Payer: Self-pay

## 2018-11-21 ENCOUNTER — Ambulatory Visit (INDEPENDENT_AMBULATORY_CARE_PROVIDER_SITE_OTHER): Payer: BC Managed Care – PPO | Admitting: *Deleted

## 2018-11-21 DIAGNOSIS — Z5181 Encounter for therapeutic drug level monitoring: Secondary | ICD-10-CM

## 2018-11-21 LAB — POCT INR: INR: 2.1 (ref 2.0–3.0)

## 2018-11-21 NOTE — Patient Instructions (Signed)
Description   Continue taking 2 tablets daily. Recheck INR in 8 weeks.  Call us with any concerns or changes. # [phone] [phone]

## 2018-11-24 ENCOUNTER — Other Ambulatory Visit: Payer: Self-pay | Admitting: Nurse Practitioner

## 2018-11-24 DIAGNOSIS — E782 Mixed hyperlipidemia: Secondary | ICD-10-CM

## 2018-11-24 DIAGNOSIS — I5022 Chronic systolic (congestive) heart failure: Secondary | ICD-10-CM

## 2018-11-24 DIAGNOSIS — Z79899 Other long term (current) drug therapy: Secondary | ICD-10-CM

## 2018-11-24 DIAGNOSIS — I480 Paroxysmal atrial fibrillation: Secondary | ICD-10-CM

## 2018-12-17 ENCOUNTER — Other Ambulatory Visit: Payer: Self-pay | Admitting: Cardiovascular Disease

## 2019-01-15 ENCOUNTER — Other Ambulatory Visit: Payer: Self-pay | Admitting: Cardiovascular Disease

## 2019-01-16 ENCOUNTER — Encounter (INDEPENDENT_AMBULATORY_CARE_PROVIDER_SITE_OTHER): Payer: Self-pay

## 2019-01-16 ENCOUNTER — Ambulatory Visit (INDEPENDENT_AMBULATORY_CARE_PROVIDER_SITE_OTHER): Payer: BC Managed Care – PPO | Admitting: Pharmacist

## 2019-01-16 ENCOUNTER — Other Ambulatory Visit: Payer: Self-pay

## 2019-01-16 DIAGNOSIS — Z5181 Encounter for therapeutic drug level monitoring: Secondary | ICD-10-CM | POA: Diagnosis not present

## 2019-01-16 DIAGNOSIS — I4891 Unspecified atrial fibrillation: Secondary | ICD-10-CM

## 2019-01-16 LAB — POCT INR: INR: 2.7 (ref 2.0–3.0)

## 2019-01-16 NOTE — Patient Instructions (Signed)
Description   Continue taking 2 tablets daily. Recheck INR in 8 weeks.  Call us with any concerns or changes. # [phone] [phone]

## 2019-01-27 ENCOUNTER — Other Ambulatory Visit: Payer: Self-pay

## 2019-01-27 ENCOUNTER — Other Ambulatory Visit: Payer: BC Managed Care – PPO | Admitting: *Deleted

## 2019-01-27 DIAGNOSIS — I480 Paroxysmal atrial fibrillation: Secondary | ICD-10-CM

## 2019-01-27 DIAGNOSIS — Z79899 Other long term (current) drug therapy: Secondary | ICD-10-CM

## 2019-01-27 DIAGNOSIS — I5022 Chronic systolic (congestive) heart failure: Secondary | ICD-10-CM

## 2019-01-27 DIAGNOSIS — E782 Mixed hyperlipidemia: Secondary | ICD-10-CM

## 2019-01-27 LAB — BASIC METABOLIC PANEL
BUN/Creatinine Ratio: 13 (ref 10–24)
BUN: 14 mg/dL (ref 8–27)
CO2: 22 mmol/L (ref 20–29)
Calcium: 9.2 mg/dL (ref 8.6–10.2)
Chloride: 104 mmol/L (ref 96–106)
Creatinine, Ser: 1.04 mg/dL (ref 0.76–1.27)
GFR calc Af Amer: 85 mL/min/{1.73_m2} (ref >59)
GFR calc non Af Amer: 74 mL/min/{1.73_m2} (ref >59)
Glucose: 107 mg/dL — ABNORMAL HIGH (ref 65–99)
Potassium: 4.5 mmol/L (ref 3.5–5.2)
Sodium: 140 mmol/L (ref 134–144)

## 2019-01-27 LAB — LIPID PANEL
Chol/HDL Ratio: 4.2 ratio (ref 0.0–5.0)
Cholesterol, Total: 168 mg/dL (ref 100–199)
HDL: 40 mg/dL (ref >39)
LDL Chol Calc (NIH): 98 mg/dL (ref 0–99)
Triglycerides: 171 mg/dL — ABNORMAL HIGH (ref 0–149)
VLDL Cholesterol Cal: 30 mg/dL (ref 5–40)

## 2019-01-27 LAB — HEPATIC FUNCTION PANEL
ALT: 12 IU/L (ref 0–44)
AST: 15 IU/L (ref 0–40)
Albumin: 4.2 g/dL (ref 3.8–4.8)
Alkaline Phosphatase: 98 IU/L (ref 39–117)
Bilirubin Total: 0.4 mg/dL (ref 0.0–1.2)
Bilirubin, Direct: 0.13 mg/dL (ref 0.00–0.40)
Total Protein: 6.8 g/dL (ref 6.0–8.5)

## 2019-01-27 LAB — MAGNESIUM: Magnesium: 2 mg/dL (ref 1.6–2.3)

## 2019-01-30 NOTE — Progress Notes (Signed)
Cardiology Office Note   Date:  01/31/2019   ID:  Ruben [name], DOB [birth year], MRN [id number]  PCP:  Ruben Au, MD  Cardiologist:  previous Ruben Clement MD,  Ruben Miss, MD   Chief Complaint  Patient presents with  . Congestive Heart Failure   Problem list 1. 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  Atrial fibrillation 3. Obesity 4. Hyperlipidemia 5. Obstructive sleep apnea 6. Mitral valve prolapse - mild MR by echo Sept. 2014    [name] - notes from Ruben [name] is a 68 y.o. male who presents for Six-month follow-up visit    He has a past history of nonischemic dilated cardiomyopathy and associated atrial fibrillation at that time. He was hospitalized in February 2012 and was critically ill with severe systolic left ventricular dysfunction and atrial fibrillation. He was successfully cardioverted back to sinus rhythm in May 2012. He did well until August 2014 when he went back into atrial fibrillation after we had cut back his amiodarone to just 100 mg daily. His dose was increased and we cardioverted him successfully on 03/02/13. Since then he has maintained normal sinus rhythm. His echocardiogram in 01/14/13 showed that his ejection fraction had improved to 50-55%. Since last visit has had no new cardiac symptoms. He has a history of sleep apnea and uses a CPAP machine successfully.  The patient has a history of hypercholesterolemia. He is not experiencing any myalgias. Recent lab work includes normal hepatic function and normal thyroid function  He has had a slight cough which is resolving on Mucinex.He has gained weight.  His family gave him a fit.bit  And he is trying to get more exercise to help him lose weight.  November 07, 2015:  Hx of a viral cardiomyopathy in 2011. Never got over a cold - progressive shortness of breath.  EF was 10% at one point  EF h Ruben [name] is doing well.   Seen for the first time today - transfer from [place]  Works for the state - [employer] .   No regular exercise .    No Cp ,  Breathing is good.  Jan. 16, 2018  Doing well. Has had a cold recently. Has not been exercising .   Jan.  25th, 2018:  Ruben [name] is seen back today for a work in visit.  During his last visit we decreased his dose of amiodarone. He has gone back into atrial fibrillation.  He has been active,   This past Sunday , he noticed that he was more short of breath,  More fatigued.   Was seen in the hospital - found to be in atrial fib  He notices a faster HR on his Fit Bit.   Has not missed any doses of anticoagulant .    Has not tried any other antiarrhythmics.   12/23/2016: Doing well Breathing is going well.   Has fatigue in the evening  Had Afib earlier this year, Started on Tikosyn 500 BID  Labs last week  Potassium = 4.4 No mag level was drawn  Echo from Feb. 2018 shows normal LV EF   Aug 18, 2017:   Ruben [name] is seen today for follow-up of his atrial fibrillation and chronic systolic congestive heart failure. His A. fib has been well controlled on Tikosyn. Had an episode of AF after shoveling snow.  Lasted for a day or so   February 16, 2018: Is here to follow-up with  his atrial fibrillation and chronic systolic congestive heart failure.  His atrial fibrillation is well controlled on Tikosyn.  His magnesium and potassium levels were drawn several days ago and are normal.  Had a brief episode of PAF - lasted for a month and then went back into rhythm.  Has had a cough - has been taking mucinex  He thought he had some fluid retention. EF was normal .     August 01, 2018:   Ruben [name] presents today for follow-up of his chronic systolic congestive heart failure and atrial fibrillation.    The  fraction was as low as 10% percent previously.   Recent echocardiogram from February, 2018 reveals an ejection fraction of 55 to 60%. He has a history of  atrial fibrillation.  He is controlled on amiodarone but due to the long-term side effects of amiodarone he was changed to Tikosyn in April, 2019.   He was last seen by [name] in the atrial fibrillation clinic in January, 2020.  He has had some recurrent episodes of atrial fibrillation.  He was successfully cardioverted on May 19, 2018.  He is on chronic Coumadin therapy.  Has not had any further episodes of AF since that time . No CP , no dyspnea,  No fever, cough.   He scheduled for follow-up to see today for an EKG, basic metabolic panel and magnesium levels.  Oct. 13, 2020   Ruben [name] is see for follow up of his atrial fib and chronic systolic CHF No cp, no  Dyspnea, no presyncope  Gets some exercise  Needs to exercise more.     Past Medical History:  Diagnosis Date  . Atrial fibrillation (HCC)   . Cough   . Non-ischemic cardiomyopathy (HCC)       . OSA (obstructive sleep apnea)     Past Surgical History:  Procedure Laterality Date  . CARDIOVERSION N/A 03/02/2013   Procedure: CARDIOVERSION;  Surgeon: Ruben Clement, MD;  Location[facility] ENDOSCOPY;  Service: Cardiovascular;  Laterality: N/A;  . CARDIOVERSION N/A 08/05/2016   Procedure: CARDIOVERSION;  Surgeon: Chilton Si, MD;  Location: [facility] ENDOSCOPY;  Service: Cardiovascular;  Laterality: N/A;  . CARDIOVERSION N/A 05/09/2018   Procedure: CARDIOVERSION;  Surgeon: Ruben Red, MD;  Location: [facility] ENDOSCOPY;  Service: Cardiovascular;  Laterality: N/A;  . FOOT FRACTURE SURGERY    . PILONIDAL CYST EXCISION       Current Outpatient Medications  Medication Sig Dispense Refill  . acetaminophen (TYLENOL) 500 MG tablet Take 1,000 mg by mouth every 8 (eight) hours as needed for mild pain or headache.    . carvedilol (COREG) 12.5 MG tablet Take 1 tablet by mouth twice daily 180 tablet 2  . dofetilide (TIKOSYN) 500 MCG capsule Take 1 capsule by mouth twice daily 180 capsule 2  . guaiFENesin (MUCINEX) 600 MG 12 hr  tablet Take 600 mg by mouth 2 (two) times daily as needed for cough or to loosen phlegm.    . hydrALAZINE (APRESOLINE) 25 MG tablet TAKE 1/2 (ONE-HALF) TABLET BY MOUTH THREE TIMES DAILY 135 tablet 0  . lisinopril (PRINIVIL,ZESTRIL) 5 MG tablet TAKE 1 TABLET BY MOUTH ONCE DAILY 90 tablet 3  . magnesium oxide (MAG-OX) 400 (241.3 Mg) MG tablet Take 1 tablet by mouth twice daily 60 tablet 6  . pravastatin (PRAVACHOL) 20 MG tablet Take 20 mg by mouth daily.    Marland Kitchen warfarin (COUMADIN) 2 MG tablet TAKE AS DIRECTED BY  COUMADIN  CLINIC 65 tablet 2   No current  facility-administered medications for this visit.     Allergies:   Patient has no known allergies.    Social History:  The patient  reports that he has never smoked. He has never used smokeless tobacco. He reports that he does not drink alcohol or use drugs.   Family History:  The patient's family history includes Cancer in his mother; Hypertension in his mother; Memory loss in his mother; Prostate cancer in his father; Stroke in his mother.    ROS:  Please see the history of present illness.   Otherwise, review of systems are positive for none.   All other systems are reviewed and negative.    Physical Exam: Blood pressure 106/70, pulse (!) 51, height 6\' 4"  (1.93 m), weight 263 lb (119.3 kg), SpO2 97 %.  Physical Exam: Blood pressure 106/70, pulse (!) 51, height 6\' 4"  (1.93 m), weight 263 lb (119.3 kg), SpO2 97 %.  GEN:  Well nourished, well developed in no acute distress HEENT: Normal NECK: No JVD; No carotid bruits LYMPHATICS: No lymphadenopathy CARDIAC: RRR , no murmurs, rubs, gallops RESPIRATORY:  Clear to auscultation without rales, wheezing or rhonchi  ABDOMEN: Soft, non-tender, non-distended MUSCULOSKELETAL:  No edema; No deformity  SKIN: Warm and dry NEUROLOGIC:  Alert and oriented x 3   EKG:  Oct. 13, 2020 .   Sinus brady at 50.  QTc is 441 ms.   Recent Labs: 05/06/2018: Hemoglobin 13.6; Platelets 313 01/27/2019: ALT  12; BUN 14; Creatinine, Ser 1.04; Magnesium 2.0; Potassium 4.5; Sodium 140    Lipid Panel    Component Value Date/Time   CHOL 168 01/27/2019 0756   TRIG 171 (H) 01/27/2019 0756   HDL 40 01/27/2019 0756   CHOLHDL 4.2 01/27/2019 0756   CHOLHDL 3.4 11/05/2015 0746   VLDL 31 (H) 11/05/2015 0746   LDLCALC 98 01/27/2019 0756   LDLDIRECT 90.2 12/30/2012 0740      Wt Readings from Last 3 Encounters:  01/31/19 263 lb (119.3 kg)  08/01/18 266 lb 9.6 oz (120.9 kg)  05/19/18 271 lb (122.9 kg)      ASSESSMENT AND PLAN:  1.  Chronic combined S/D  CHF :  His ejection fraction has corrected.  He overall is not having any signs or symptoms of congestive heart failure.  2. Persistant  atrial fibrillation, he status post cardioversion.  He is currently on Tikosyn which seems to be working well.  We will get an EKG today.  He will continue to see [place] every 6 months with basic metabolic profile and magnesium level.  3. Dyslipidemia.   Last lipid levels look good.  Continue Pravachol.  I will see him in 6 months for lipid levels and liver enzymes.Marland Kitchen   4.       Mertie Moores, MD  01/31/2019 8:26 AM    [address],  [place], [place]  [address] Pager [phone] Phone: [phone]; Fax: [phone]

## 2019-01-31 ENCOUNTER — Ambulatory Visit (INDEPENDENT_AMBULATORY_CARE_PROVIDER_SITE_OTHER): Payer: BC Managed Care – PPO | Admitting: Cardiovascular Disease

## 2019-01-31 ENCOUNTER — Other Ambulatory Visit: Payer: Self-pay

## 2019-01-31 ENCOUNTER — Encounter: Payer: Self-pay | Admitting: Cardiovascular Disease

## 2019-01-31 VITALS — BP 106/70 | HR 51 | Ht 76.0 in | Wt 263.0 lb

## 2019-01-31 DIAGNOSIS — I5022 Chronic systolic (congestive) heart failure: Secondary | ICD-10-CM | POA: Diagnosis not present

## 2019-01-31 DIAGNOSIS — E785 Hyperlipidemia, unspecified: Secondary | ICD-10-CM

## 2019-01-31 DIAGNOSIS — I4819 Other persistent atrial fibrillation: Secondary | ICD-10-CM

## 2019-01-31 DIAGNOSIS — Z79899 Other long term (current) drug therapy: Secondary | ICD-10-CM

## 2019-01-31 MED ORDER — CARVEDILOL 6.25 MG PO TABS: 6.2500 mg | ORAL_TABLET | Freq: Two times a day (BID) | ORAL | Status: DC

## 2019-01-31 MED ORDER — CARVEDILOL 6.25 MG PO TABS: 12.5000 mg | ORAL_TABLET | Freq: Two times a day (BID) | ORAL | Status: DC

## 2019-01-31 NOTE — Patient Instructions (Signed)
Medication Instructions:  Your physician has recommended you make the following change in your medication:   1-Decrease Carvedilol (Coreg) 6.25 mg by mouth twice daily  If you need a refill on your cardiac medications before your next appointment, please call your pharmacy.   Lab work: Your physician recommends that you return for lab work in: 6 months for fasting lipid and liver panel, Magnesium, BMET  If you have labs (blood work) drawn today and your tests are completely normal, you will receive your results only by: Marland Kitchen MyChart Message (if you have MyChart) OR . A paper copy in the mail If you have any lab test that is abnormal or we need to change your treatment, we will call you to review the results.  Testing/Procedures: None ordered today.  Follow-Up: At [facility], you and your health needs are our priority.  As part of our continuing mission to provide you with exceptional heart care, we have created designated Provider Care Teams.  These Care Teams include your primary Cardiologist (physician) and Advanced Practice Providers (APPs -  Physician Assistants and Nurse Practitioners) who all work together to provide you with the care you need, when you need it. You will need a follow up appointment in:  6 months.  Please call our office 2 months in advance to schedule this appointment.  You may see Mertie Moores, MD or one of the following Advanced Practice Providers on your designated Care Team: Richardson Dopp, PA-C [place], [place] . Daune Perch, NP

## 2019-02-09 ENCOUNTER — Telehealth: Payer: Self-pay | Admitting: Cardiovascular Disease

## 2019-02-09 NOTE — Telephone Encounter (Signed)
Patient is calling because he wants to know if he needs a pneumonia shot this year.

## 2019-02-09 NOTE — Telephone Encounter (Signed)
Spoke with patient and advised him that according to his Epic medical record he has received one PPSV23 pneumonia vaccine in 1/18. I advised that he may have received the vaccine through a different provider not in our Epic system but that 2 vaccines are recommended. Patient states he will check with his other providers for verification. He thanked me for the call.

## 2019-02-11 ENCOUNTER — Other Ambulatory Visit: Payer: Self-pay | Admitting: Cardiovascular Disease

## 2019-03-11 ENCOUNTER — Other Ambulatory Visit (HOSPITAL_COMMUNITY): Payer: Self-pay | Admitting: Nurse Practitioner

## 2019-03-13 ENCOUNTER — Encounter (INDEPENDENT_AMBULATORY_CARE_PROVIDER_SITE_OTHER): Payer: Self-pay

## 2019-03-13 ENCOUNTER — Other Ambulatory Visit: Payer: Self-pay

## 2019-03-13 ENCOUNTER — Ambulatory Visit (INDEPENDENT_AMBULATORY_CARE_PROVIDER_SITE_OTHER): Payer: BC Managed Care – PPO | Admitting: Pharmacist

## 2019-03-13 DIAGNOSIS — Z5181 Encounter for therapeutic drug level monitoring: Secondary | ICD-10-CM

## 2019-03-13 LAB — POCT INR: INR: 2.1 (ref 2.0–3.0)

## 2019-03-13 NOTE — Patient Instructions (Signed)
Description   Continue taking 2 tablets daily. Recheck INR in 8 weeks.  Call us with any concerns or changes. # [phone]      

## 2019-03-25 ENCOUNTER — Other Ambulatory Visit: Payer: Self-pay | Admitting: Cardiovascular Disease

## 2019-04-22 ENCOUNTER — Other Ambulatory Visit: Payer: Self-pay | Admitting: Cardiovascular Disease

## 2019-05-08 ENCOUNTER — Ambulatory Visit (INDEPENDENT_AMBULATORY_CARE_PROVIDER_SITE_OTHER): Payer: BC Managed Care – PPO | Admitting: Pharmacist

## 2019-05-08 ENCOUNTER — Other Ambulatory Visit: Payer: Self-pay

## 2019-05-08 DIAGNOSIS — Z5181 Encounter for therapeutic drug level monitoring: Secondary | ICD-10-CM | POA: Diagnosis not present

## 2019-05-08 DIAGNOSIS — I4819 Other persistent atrial fibrillation: Secondary | ICD-10-CM

## 2019-05-08 LAB — POCT INR: INR: 2.7 (ref 2.0–3.0)

## 2019-05-08 NOTE — Patient Instructions (Signed)
Description   Continue taking 2 tablets daily. Recheck INR in 10 weeks.  Call us with any concerns or changes. # [phone]      

## 2019-06-14 ENCOUNTER — Telehealth: Payer: Self-pay

## 2019-06-14 NOTE — Telephone Encounter (Signed)
**Note De-Identified Ruben [name] Obfuscation** I started a Dofetilide PA through covermymeds. Key: [id number]

## 2019-06-15 MED ORDER — DOFETILIDE 500 MCG PO CAPS: 500.0000 ug | ORAL_CAPSULE | Freq: Two times a day (BID) | ORAL | 2 refills | Status: DC

## 2019-06-15 NOTE — Telephone Encounter (Signed)
**Note De-Identified Addilyne Backs Obfuscation** Letter received from [facility] stating that they have approved the pts Dofetilide PA. Approval is good until 06/13/2020.  I have notified the pts pharmacy [facility] [employer]) of this approval and e-scribed a new Dofetilide RX to them as one was needed.

## 2019-07-17 ENCOUNTER — Ambulatory Visit (INDEPENDENT_AMBULATORY_CARE_PROVIDER_SITE_OTHER): Payer: BC Managed Care – PPO | Admitting: Pharmacist

## 2019-07-17 ENCOUNTER — Other Ambulatory Visit: Payer: Self-pay

## 2019-07-17 DIAGNOSIS — Z5181 Encounter for therapeutic drug level monitoring: Secondary | ICD-10-CM

## 2019-07-17 LAB — POCT INR: INR: 2.2 (ref 2.0–3.0)

## 2019-07-17 NOTE — Patient Instructions (Signed)
Description   Continue taking 2 tablets daily. Recheck INR in 10 weeks.  Call us with any concerns or changes. # [phone]      

## 2019-07-31 ENCOUNTER — Other Ambulatory Visit: Payer: BC Managed Care – PPO | Admitting: *Deleted

## 2019-07-31 ENCOUNTER — Other Ambulatory Visit: Payer: Self-pay

## 2019-07-31 DIAGNOSIS — E785 Hyperlipidemia, unspecified: Secondary | ICD-10-CM

## 2019-07-31 DIAGNOSIS — Z79899 Other long term (current) drug therapy: Secondary | ICD-10-CM

## 2019-07-31 LAB — HEPATIC FUNCTION PANEL
ALT: 14 IU/L (ref 0–44)
AST: 17 IU/L (ref 0–40)
Albumin: 4.1 g/dL (ref 3.8–4.8)
Alkaline Phosphatase: 97 IU/L (ref 39–117)
Bilirubin Total: 0.5 mg/dL (ref 0.0–1.2)
Bilirubin, Direct: 0.11 mg/dL (ref 0.00–0.40)
Total Protein: 6.2 g/dL (ref 6.0–8.5)

## 2019-07-31 LAB — LIPID PANEL
Chol/HDL Ratio: 4.5 ratio (ref 0.0–5.0)
Cholesterol, Total: 168 mg/dL (ref 100–199)
HDL: 37 mg/dL — ABNORMAL LOW (ref >39)
LDL Chol Calc (NIH): 96 mg/dL (ref 0–99)
Triglycerides: 203 mg/dL — ABNORMAL HIGH (ref 0–149)
VLDL Cholesterol Cal: 35 mg/dL (ref 5–40)

## 2019-07-31 LAB — MAGNESIUM: Magnesium: 2 mg/dL (ref 1.6–2.3)

## 2019-07-31 LAB — BASIC METABOLIC PANEL
BUN/Creatinine Ratio: 16 (ref 10–24)
BUN: 17 mg/dL (ref 8–27)
CO2: 23 mmol/L (ref 20–29)
Calcium: 9 mg/dL (ref 8.6–10.2)
Chloride: 107 mmol/L — ABNORMAL HIGH (ref 96–106)
Creatinine, Ser: 1.06 mg/dL (ref 0.76–1.27)
GFR calc Af Amer: 83 mL/min/{1.73_m2} (ref >59)
GFR calc non Af Amer: 72 mL/min/{1.73_m2} (ref >59)
Glucose: 106 mg/dL — ABNORMAL HIGH (ref 65–99)
Potassium: 4.6 mmol/L (ref 3.5–5.2)
Sodium: 143 mmol/L (ref 134–144)

## 2019-08-01 ENCOUNTER — Encounter: Payer: Self-pay | Admitting: Cardiovascular Disease

## 2019-08-01 ENCOUNTER — Ambulatory Visit (INDEPENDENT_AMBULATORY_CARE_PROVIDER_SITE_OTHER): Payer: BC Managed Care – PPO | Admitting: Cardiovascular Disease

## 2019-08-01 VITALS — BP 110/68 | HR 42 | Wt 271.0 lb

## 2019-08-01 DIAGNOSIS — I5022 Chronic systolic (congestive) heart failure: Secondary | ICD-10-CM

## 2019-08-01 DIAGNOSIS — E781 Pure hyperglyceridemia: Secondary | ICD-10-CM | POA: Diagnosis not present

## 2019-08-01 DIAGNOSIS — Z79899 Other long term (current) drug therapy: Secondary | ICD-10-CM | POA: Diagnosis not present

## 2019-08-01 DIAGNOSIS — I4819 Other persistent atrial fibrillation: Secondary | ICD-10-CM

## 2019-08-01 DIAGNOSIS — E782 Mixed hyperlipidemia: Secondary | ICD-10-CM | POA: Diagnosis not present

## 2019-08-01 NOTE — Patient Instructions (Signed)
Medication Instructions:  Your physician has recommended you make the following change in your medication:  STOP Carvedilol (Coreg)  *If you need a refill on your cardiac medications before your next appointment, please call your pharmacy*   Lab Work: Your physician recommends that you return for lab work in 6 months at next office visit or a few days prior  If you have labs (blood work) drawn today and your tests are completely normal, you will receive your results only by: Marland Kitchen MyChart Message (if you have MyChart) OR . A paper copy in the mail If you have any lab test that is abnormal or we need to change your treatment, we will call you to review the results.   Testing/Procedures: None Ordered   Follow-Up: At [facility], you and your health needs are our priority.  As part of our continuing mission to provide you with exceptional heart care, we have created designated Provider Care Teams.  These Care Teams include your primary Cardiologist (physician) and Advanced Practice Providers (APPs -  Physician Assistants and Nurse Practitioners) who all work together to provide you with the care you need, when you need it.  We recommend signing up for the patient portal called "MyChart".  Sign up information is provided on this After Visit Summary.  MyChart is used to connect with patients for Virtual Visits (Telemedicine).  Patients are able to view lab/test results, encounter notes, upcoming appointments, etc.  Non-urgent messages can be sent to your provider as well.   To learn more about what you can do with MyChart, go to [email or web address].    Your next appointment:   6 month(s)  The format for your next appointment:   In Person  Provider:   You may see [name], MD or one of the following Advanced Practice Providers on your designated Care Team:    Tereso Newcomer, PA-C  [place], [place]  Berton Bon, [place]

## 2019-08-01 NOTE — Progress Notes (Signed)
Cardiology Office Note   Date:  08/01/2019   ID:  Ruben [name], DOB July 09, 1950, MRN [id number]  PCP:  Ruben Bill, MD  Cardiologist:  previous Ruben Coco MD,  Ruben Moores, MD   Chief Complaint  Patient presents with  . Congestive Heart Failure  . Hyperlipidemia   Problem list 1. 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  Atrial fibrillation 3. Obesity 4. Hyperlipidemia 5. Obstructive sleep apnea 6. Mitral valve prolapse - mild MR by echo Sept. 2014    [name] - notes from Ruben [name] a 69 y.o. male who presents for Six-month follow-up visit    He has a past history of nonischemic dilated cardiomyopathy and associated atrial fibrillation at that time. He was hospitalized in February 2012 and was critically ill with severe systolic left ventricular dysfunction and atrial fibrillation. He was successfully cardioverted back to sinus rhythm in May 2012. He did well until August 2014 when he went back into atrial fibrillation after we had cut back his amiodarone to just 100 mg daily. His dose was increased and we cardioverted him successfully on 03/02/13. Since then he has maintained normal sinus rhythm. His echocardiogram in 01/14/13 showed that his ejection fraction had improved to 50-55%. Since last visit has had no new cardiac symptoms. He has a history of sleep apnea and uses a CPAP machine successfully.  The patient has a history of hypercholesterolemia. He [name] not experiencing any myalgias. Recent lab work includes normal hepatic function and normal thyroid function  He has had a slight cough which [name] resolving on Mucinex.He has gained weight.  His family gave him a fit.bit  And he [name] trying to get more exercise to help him lose weight.  November 07, 2015:  Hx of a viral cardiomyopathy in 2011. Never got over a cold - progressive shortness of breath.  EF was 10% at one point  EF h Ruben [name]  [name] doing well.  Seen for the first time today - transfer from [place]  Works for the state - [employer] .   No regular exercise .    No Cp ,  Breathing [name] good.  Jan. 16, 2018  Doing well. Has had a cold recently. Has not been exercising .   Jan.  25th, 2018:  Ruben [name] [name] seen back today for a work in visit.  During his last visit we decreased his dose of amiodarone. He has gone back into atrial fibrillation.  He has been active,   This past Sunday , he noticed that he was more short of breath,  More fatigued.   Was seen in the hospital - found to be in atrial fib  He notices a faster HR on his Fit Bit.   Has not missed any doses of anticoagulant .    Has not tried any other antiarrhythmics.   12/23/2016: Doing well Breathing [name] going well.   Has fatigue in the evening  Had Afib earlier this year, Started on Tikosyn 500 BID  Labs last week  Potassium = 4.4 No mag level was drawn  Echo from Feb. 2018 shows normal LV EF   Aug 18, 2017:   Ruben [name] [name] seen today for follow-up of his atrial fibrillation and chronic systolic congestive heart failure. His A. fib has been well controlled on Tikosyn. Had an episode of AF after shoveling snow.  Lasted for a day or so   February 16, 2018: [name] here  to follow-up with his atrial fibrillation and chronic systolic congestive heart failure.  His atrial fibrillation [name] well controlled on Tikosyn.  His magnesium and potassium levels were drawn several days ago and are normal.  Had a brief episode of PAF - lasted for a month and then went back into rhythm.  Has had a cough - has been taking mucinex  He thought he had some fluid retention. EF was normal .     August 01, 2018:   Ruben [name] presents today for follow-up of his chronic systolic congestive heart failure and atrial fibrillation.    The  fraction was as low as 10% percent previously.   Recent echocardiogram from February, 2018 reveals an ejection fraction of 55 to 60%. He  has a history of atrial fibrillation.  He [name] controlled on amiodarone but due to the long-term side effects of amiodarone he was changed to Tikosyn in April, 2019.   He was last seen by Rudi [name] in the atrial fibrillation clinic in January, 2020.  He has had some recurrent episodes of atrial fibrillation.  He was successfully cardioverted on May 19, 2018.  He [name] on chronic Coumadin therapy.  Has not had any further episodes of AF since that time . No CP , no dyspnea,  No fever, cough.   He scheduled for follow-up to see today for an EKG, basic metabolic panel and magnesium levels.  Oct. 13, 2020   Ruben [name] see for follow up of his atrial fib and chronic systolic CHF No cp, no  Dyspnea, no presyncope  Gets some exercise  Needs to exercise more.    August 01, 2019: Ruben [name] seen today for follow-up of his chronic systolic and diastolic congestive heart failure, hyperlipidemia, atrial fibrillation.  He remains on Tikosyn.  His last echocardiogram revealed an ejection fraction of 55 to 60%.  We were unable to evaluate diastolic function.  He has mild mitral regurgitation.  The left atrium [name] moderately dilated.  HR [name] slow. Has not had any syncope  Breathing [name] good.  Avoids salt,   May eat some processed meats    Past Medical History:  Diagnosis Date  . Atrial fibrillation (HCC)   . Cough   . Non-ischemic cardiomyopathy (HCC)       . OSA (obstructive sleep apnea)     Past Surgical History:  Procedure Laterality Date  . CARDIOVERSION N/A 03/02/2013   Procedure: CARDIOVERSION;  Surgeon: [name], MD;  Location: [facility] ENDOSCOPY;  Service: Cardiovascular;  Laterality: N/A;  . CARDIOVERSION N/A 08/05/2016   Procedure: CARDIOVERSION;  Surgeon: Chilton Si, MD;  Location: [facility] ENDOSCOPY;  Service: Cardiovascular;  Laterality: N/A;  . CARDIOVERSION N/A 05/09/2018   Procedure: CARDIOVERSION;  Surgeon: Jodelle Red, MD;  Location: [facility] ENDOSCOPY;  Service:  Cardiovascular;  Laterality: N/A;  . FOOT FRACTURE SURGERY    . PILONIDAL CYST EXCISION       Current Outpatient Medications  Medication Sig Dispense Refill  . acetaminophen (TYLENOL) 500 MG tablet Take 1,000 mg by mouth every 8 (eight) hours as needed for mild pain or headache.    . dofetilide (TIKOSYN) 500 MCG capsule Take 1 capsule (500 mcg total) by mouth 2 (two) times daily. 180 capsule 2  . guaiFENesin (MUCINEX) 600 MG 12 hr tablet Take 600 mg by mouth 2 (two) times daily as needed for cough or to loosen phlegm.    . hydrALAZINE (APRESOLINE) 25 MG tablet TAKE 1/2 (ONE-HALF) TABLET BY MOUTH THREE TIMES DAILY 135 tablet  3  . lisinopril (ZESTRIL) 5 MG tablet Take 1 tablet by mouth once daily 90 tablet 2  . magnesium oxide (MAG-OX) 400 (241.3 Mg) MG tablet Take 1 tablet by mouth twice daily 60 tablet 11  . pravastatin (PRAVACHOL) 20 MG tablet Take 20 mg by mouth daily.    Marland Kitchen warfarin (COUMADIN) 2 MG tablet TAKE AS DIRECTED BY  COUMADIN  CLINIC 65 tablet 3   No current facility-administered medications for this visit.    Allergies:   Patient has no known allergies.    Social History:  The patient  reports that he has never smoked. He has never used smokeless tobacco. He reports that he does not drink alcohol or use drugs.   Family History:  The patient's family history includes Cancer in his mother; Hypertension in his mother; Memory loss in his mother; Prostate cancer in his father; Stroke in his mother.    ROS:  Please see the history of present illness.   Otherwise, review of systems are positive for none.   All other systems are reviewed and negative.    Physical Exam: Blood pressure 110/68, pulse (!) 42, weight 271 lb (122.9 kg), SpO2 97 %.  GEN:  Well nourished, well developed in no acute distress HEENT: Normal NECK: No JVD; No carotid bruits LYMPHATICS: No lymphadenopathy CARDIAC: RRR , no murmurs, rubs, gallops RESPIRATORY:  Clear to auscultation without rales, wheezing  or rhonchi  ABDOMEN: Soft, non-tender, non-distended MUSCULOSKELETAL:  No edema; No deformity  SKIN: Warm and dry NEUROLOGIC:  Alert and oriented x 3   EKG:     August 01, 2019: Sinus bradycardia at 42.  First-degree AV block.  No ST or T wave changes.  Recent Labs: 07/31/2019: ALT 14; BUN 17; Creatinine, Ser 1.06; Magnesium 2.0; Potassium 4.6; Sodium 143    Lipid Panel    Component Value Date/Time   CHOL 168 07/31/2019 0736   TRIG 203 (H) 07/31/2019 0736   HDL 37 (L) 07/31/2019 0736   CHOLHDL 4.5 07/31/2019 0736   CHOLHDL 3.4 11/05/2015 0746   VLDL 31 (H) 11/05/2015 0746   LDLCALC 96 07/31/2019 0736   LDLDIRECT 90.2 12/30/2012 0740      Wt Readings from Last 3 Encounters:  08/01/19 271 lb (122.9 kg)  01/31/19 263 lb (119.3 kg)  08/01/18 266 lb 9.6 oz (120.9 kg)      ASSESSMENT AND PLAN:  1.  Chronic combined S/D  CHF : He [name] doing well.  [name] not having any shortness of breath.  He still eats occasional processed meats and I encouraged him to work on reducing that.   2. Persistant  atrial fibrillation, he [name] maintaining sinus rhythm.  [name] on Tikosyn.  His heart rate [name] fairly slow.  We will discontinue the carvedilol. I will see him again in 6 months for office visit, EKG, magnesium level, basic metabolic profile.   3. Dyslipidemia.  His cholesterol levels are fairly well controlled.  His triglycerides remain elevated.  I encouraged him to reduce his intake of carbohydrates.  I encouraged him to also increase his exercise. .   4.    Hypertension: We are stopping his carvedilol.  I have asked him to measure his blood pressure on a regular basis and if his blood pressure increases we will increase the lisinopril.   [name], MD  08/01/2019 8:28 AM    [facility] [address] [place],  [address] [place], [place]  [address] Pager [phone] Phone: [phone]; Fax: [phone]

## 2019-09-02 ENCOUNTER — Other Ambulatory Visit: Payer: Self-pay | Admitting: Cardiovascular Disease

## 2019-09-19 ENCOUNTER — Telehealth: Payer: Self-pay | Admitting: Cardiovascular Disease

## 2019-09-19 NOTE — Telephone Encounter (Signed)
Patient is calling to make Dr. [name] aware that he is having an emergency root canal this afternoon, 09/19/19 at 3:00 PM. He states he will need to take Amoxicillin and he would like to ensure that it will not interfere with his coumadin. Please advise.

## 2019-09-19 NOTE — Telephone Encounter (Signed)
Ok to take amoxicillin for dental work, pt is aware.

## 2019-09-20 ENCOUNTER — Other Ambulatory Visit: Payer: Self-pay

## 2019-09-20 ENCOUNTER — Ambulatory Visit (INDEPENDENT_AMBULATORY_CARE_PROVIDER_SITE_OTHER): Payer: BC Managed Care – PPO

## 2019-09-20 DIAGNOSIS — Z5181 Encounter for therapeutic drug level monitoring: Secondary | ICD-10-CM | POA: Diagnosis not present

## 2019-09-20 DIAGNOSIS — I4819 Other persistent atrial fibrillation: Secondary | ICD-10-CM

## 2019-09-20 LAB — POCT INR: INR: 2.5 (ref 2.0–3.0)

## 2019-09-20 NOTE — Patient Instructions (Signed)
Description   Continue taking 2 tablets daily. Recheck INR in 10 weeks.  Call us with any concerns or changes. # [phone]      

## 2019-11-02 ENCOUNTER — Telehealth: Payer: Self-pay | Admitting: Cardiovascular Disease

## 2019-11-02 NOTE — Telephone Encounter (Signed)
Agree with the note by Charlotte Crumb, RN

## 2019-11-02 NOTE — Telephone Encounter (Signed)
I spoke with patient. He reports he missed his evening Tikosyn dose last night. I told him to resume normal dosing today.  He has already done this and took AM dose earlier today.  He is feeling fine.

## 2019-11-02 NOTE — Telephone Encounter (Signed)
New Message:      Please call, question about his Tikosyn.

## 2019-12-06 ENCOUNTER — Ambulatory Visit (INDEPENDENT_AMBULATORY_CARE_PROVIDER_SITE_OTHER): Payer: BC Managed Care – PPO | Admitting: *Deleted

## 2019-12-06 ENCOUNTER — Other Ambulatory Visit: Payer: Self-pay

## 2019-12-06 DIAGNOSIS — Z5181 Encounter for therapeutic drug level monitoring: Secondary | ICD-10-CM

## 2019-12-06 LAB — POCT INR: INR: 2.2 (ref 2.0–3.0)

## 2019-12-06 NOTE — Patient Instructions (Signed)
Description   Continue taking 2 tablets daily. Recheck INR in 10 weeks.  Call us with any concerns or changes. # [phone]

## 2019-12-09 ENCOUNTER — Other Ambulatory Visit: Payer: Self-pay | Admitting: Cardiovascular Disease

## 2019-12-11 ENCOUNTER — Emergency Department (HOSPITAL_BASED_OUTPATIENT_CLINIC_OR_DEPARTMENT_OTHER): Payer: BC Managed Care – PPO

## 2019-12-11 ENCOUNTER — Encounter (HOSPITAL_BASED_OUTPATIENT_CLINIC_OR_DEPARTMENT_OTHER): Payer: Self-pay | Admitting: *Deleted

## 2019-12-11 ENCOUNTER — Other Ambulatory Visit: Payer: Self-pay

## 2019-12-11 ENCOUNTER — Emergency Department (HOSPITAL_BASED_OUTPATIENT_CLINIC_OR_DEPARTMENT_OTHER)
Admission: EM | Admit: 2019-12-11 | Discharge: 2019-12-11 | Disposition: A | Discharge status: Home / Self Care | Payer: BC Managed Care – PPO | Attending: Emergency Medicine | Admitting: Emergency Medicine

## 2019-12-11 DIAGNOSIS — R002 Palpitations: Secondary | ICD-10-CM | POA: Diagnosis present

## 2019-12-11 DIAGNOSIS — I5022 Chronic systolic (congestive) heart failure: Secondary | ICD-10-CM | POA: Insufficient documentation

## 2019-12-11 DIAGNOSIS — I480 Paroxysmal atrial fibrillation: Secondary | ICD-10-CM | POA: Diagnosis not present

## 2019-12-11 DIAGNOSIS — Z79899 Other long term (current) drug therapy: Secondary | ICD-10-CM | POA: Insufficient documentation

## 2019-12-11 LAB — BASIC METABOLIC PANEL
Anion gap: 10 (ref 5–15)
BUN: 16 mg/dL (ref 8–23)
CO2: 22 mmol/L (ref 22–32)
Calcium: 8.6 mg/dL — ABNORMAL LOW (ref 8.9–10.3)
Chloride: 104 mmol/L (ref 98–111)
Creatinine, Ser: 1.22 mg/dL (ref 0.61–1.24)
GFR calc Af Amer: >60 mL/min (ref >60)
GFR calc non Af Amer: >60 mL/min (ref >60)
Glucose, Bld: 103 mg/dL — ABNORMAL HIGH (ref 70–99)
Potassium: 3.8 mmol/L (ref 3.5–5.1)
Sodium: 136 mmol/L (ref 135–145)

## 2019-12-11 LAB — PROTIME-INR
INR: 2 — ABNORMAL HIGH (ref 0.8–1.2)
Prothrombin Time: 21.6 seconds — ABNORMAL HIGH (ref 11.4–15.2)

## 2019-12-11 LAB — CBC
HCT: 45.4 % (ref 39.0–52.0)
Hemoglobin: 15.2 g/dL (ref 13.0–17.0)
MCH: 31 pg (ref 26.0–34.0)
MCHC: 33.5 g/dL (ref 30.0–36.0)
MCV: 92.7 fL (ref 80.0–100.0)
Platelets: 320 10*3/uL (ref 150–400)
RBC: 4.9 MIL/uL (ref 4.22–5.81)
RDW: 12.8 % (ref 11.5–15.5)
WBC: 7.7 10*3/uL (ref 4.0–10.5)
nRBC: 0 % (ref 0.0–0.2)

## 2019-12-11 LAB — MAGNESIUM: Magnesium: 1.9 mg/dL (ref 1.7–2.4)

## 2019-12-11 MED ORDER — MAGNESIUM SULFATE IN D5W 1-5 GM/100ML-% IV SOLN
1.0000 g | Freq: Once | INTRAVENOUS | Status: AC
  Administered 2019-12-11: 1 g via INTRAVENOUS
  Filled 2019-12-11: qty 100

## 2019-12-11 NOTE — ED Triage Notes (Signed)
Reports sudden onset dizziness and heart palpitations while driving. Reports nausea, denies SOB. Hx of afib.

## 2019-12-11 NOTE — Discharge Instructions (Addendum)
Continue your regular medications.  If your heart rate is sustained greater than 110 at rest, you develop worsening palpitations, chest pain or difficulty in breathing, please return immediately to the ER for reassessment.  Please call your cardiology office tomorrow morning to get a follow-up appointment as soon as possible to discuss your atrial fibrillation management.

## 2019-12-11 NOTE — ED Provider Notes (Signed)
[facility] EMERGENCY DEPARTMENT Provider Note   CSN: [id number] Arrival date & time: 12/11/19  1905     History Chief Complaint  Patient presents with  . Palpitations    Ruben [name] is a 69 y.o. male.  Presents to the emergency room with concern for palpitations.  Patient reports that earlier this evening he had a sudden onset of heart racing, palpitations.  Occurred at rest, not associated with chest pain.  He reports that this has steadily gotten better and now has very minimal to no symptoms.  No recent illnesses, no fevers, chills.  He has long history of atrial fibrillation.  Reports that he normally is in a sinus rhythm.  Managed on Tikosyn.  No missed doses.  HPI     Past Medical History:  Diagnosis Date  . Atrial fibrillation (HCC)   . Cough   . Non-ischemic cardiomyopathy (HCC)       . OSA (obstructive sleep apnea)     Patient Active Problem List   Diagnosis Date Noted  . Long term current use of antiarrhythmic drug 12/23/2016  . Visit for monitoring Tikosyn therapy 08/03/2016  . Chronic systolic CHF (congestive heart failure) (HCC) 11/07/2015  . Cyst of eyelid 03/22/2014  . Discoloration of nail 03/22/2014  . Detachment of nail 03/22/2014  . Encounter for therapeutic drug monitoring 05/23/2013  . HLD (hyperlipidemia) 05/27/2012  . Malaise and fatigue 03/30/2012  . Pure hypercholesterolemia 11/17/2010  . OSA (obstructive sleep apnea) 09/04/2010  . CARDIOMYOPATHY 06/05/2010  . Atrial fibrillation (HCC) 06/05/2010    Past Surgical History:  Procedure Laterality Date  . CARDIOVERSION N/A 03/02/2013   Procedure: CARDIOVERSION;  Surgeon: [name], MD;  Location: [facility] ENDOSCOPY;  Service: Cardiovascular;  Laterality: N/A;  . CARDIOVERSION N/A 08/05/2016   Procedure: CARDIOVERSION;  Surgeon: Chilton Si, MD;  Location: [facility] ENDOSCOPY;  Service: Cardiovascular;  Laterality: N/A;  . CARDIOVERSION N/A 05/09/2018   Procedure:  CARDIOVERSION;  Surgeon: Jodelle Red, MD;  Location: [facility] ENDOSCOPY;  Service: Cardiovascular;  Laterality: N/A;  . FOOT FRACTURE SURGERY    . PILONIDAL CYST EXCISION         Family History  Problem Relation Age of Onset  . Cancer Mother   . Stroke Mother   . Hypertension Mother   . Memory loss Mother   . Prostate cancer Father     Social History   Tobacco Use  . Smoking status: Never Smoker  . Smokeless tobacco: Never Used  Vaping Use  . Vaping Use: Never used  Substance Use Topics  . Alcohol use: No  . Drug use: No    Home Medications Prior to Admission medications   Medication Sig Start Date End Date Taking? Authorizing Provider  acetaminophen (TYLENOL) 500 MG tablet Take 1,000 mg by mouth every 8 (eight) hours as needed for mild pain or headache.    [provider]  dofetilide (TIKOSYN) 500 MCG capsule Take 1 capsule (500 mcg total) by mouth 2 (two) times daily. 06/15/19   Nahser, Deloris Ping, MD  guaiFENesin (MUCINEX) 600 MG 12 hr tablet Take 600 mg by mouth 2 (two) times daily as needed for cough or to loosen phlegm.    [provider]  hydrALAZINE (APRESOLINE) 25 MG tablet TAKE 1/2 (ONE-HALF) TABLET BY MOUTH THREE TIMES DAILY 02/14/19   Nahser, Deloris Ping, MD  lisinopril (ZESTRIL) 5 MG tablet Take 1 tablet by mouth once daily 12/11/19   Nahser, Deloris Ping, MD  magnesium oxide (MAG-OX) 400 (241.3  Mg) MG tablet Take 1 tablet by mouth twice daily 03/13/19   Newman Nip, NP  pravastatin (PRAVACHOL) 20 MG tablet Take 20 mg by mouth daily.    [provider]  warfarin (COUMADIN) 2 MG tablet TAKE AS DIRECTED BY  COUMADIN  CLINIC 09/04/19   Nahser, Deloris Ping, MD    Allergies    Patient has no known allergies.  Review of Systems   Review of Systems  Constitutional: Negative for chills and fever.  HENT: Negative for ear pain and sore throat.   Eyes: Negative for pain and visual disturbance.  Respiratory: Negative for cough and shortness of  breath.   Cardiovascular: Positive for palpitations. Negative for chest pain.  Gastrointestinal: Negative for abdominal pain and vomiting.  Genitourinary: Negative for dysuria and hematuria.  Musculoskeletal: Negative for arthralgias and back pain.  Skin: Negative for color change and rash.  Neurological: Negative for seizures and syncope.  All other systems reviewed and are negative.   Physical Exam Updated Vital Signs BP 102/76   Pulse 75   Temp 98.2 F (36.8 C) (Oral)   Resp 14   Ht 6\' 4"  (1.93 m)   Wt 122.5 kg   SpO2 98%   BMI 32.87 kg/m   Physical Exam Vitals and nursing note reviewed.  Constitutional:      Appearance: He is well-developed.  HENT:     Head: Normocephalic and atraumatic.  Eyes:     Conjunctiva/sclera: Conjunctivae normal.  Cardiovascular:     Rate and Rhythm: Normal rate. Rhythm irregular.     Pulses: Normal pulses.     Heart sounds: No murmur heard.   Pulmonary:     Effort: Pulmonary effort is normal. No respiratory distress.     Breath sounds: Normal breath sounds.  Abdominal:     Palpations: Abdomen is soft.     Tenderness: There is no abdominal tenderness.  Musculoskeletal:        General: No deformity or signs of injury.     Cervical back: Neck supple.  Skin:    General: Skin is warm and dry.     Capillary Refill: Capillary refill takes less than 2 seconds.  Neurological:     General: No focal deficit present.     Mental Status: He is alert and oriented to person, place, and time.     ED Results / Procedures / Treatments   Labs (all labs ordered are listed, but only abnormal results are displayed) Labs Reviewed  BASIC METABOLIC PANEL - Abnormal; Notable for the following components:      Result Value   Glucose, Bld 103 (*)    Calcium 8.6 (*)    All other components within normal limits  PROTIME-INR - Abnormal; Notable for the following components:   Prothrombin Time 21.6 (*)    INR 2.0 (*)    All other components within  normal limits  CBC  MAGNESIUM    EKG EKG Interpretation  Date/Time:  Monday December 11 2019 20:55:41 EDT Ventricular Rate:  89 PR Interval:    QRS Duration: 110 QT Interval:  399 QTC Calculation: 486 R Axis:   -26 Text Interpretation: Atrial fibrillation Ventricular premature complex Borderline left axis deviation Low voltage, extremity leads Borderline prolonged QT interval Confirmed by [id number] (Marianna Fuss) on 12/12/2019 12:07:03 AM   Radiology DG Chest 2 View  Result Date: 12/11/2019 CLINICAL DATA:  Dizziness, heart palpitations EXAM: CHEST - 2 VIEW COMPARISON:  10/27/2013 FINDINGS: Heart is normal size. Tortuous aorta.  Lingular scarring. Right lung clear. No effusions or acute bony abnormality. IMPRESSION: No active cardiopulmonary disease. Electronically Signed   By: Charlett Nose M.D.   On: 12/11/2019 20:02    Procedures Procedures (including critical care time)  Medications Ordered in ED Medications  magnesium sulfate IVPB 1 g 100 mL ( Intravenous Stopped 12/11/19 2159)    ED Course  I have reviewed the triage vital signs and the nursing notes.  Pertinent labs & imaging results that were available during my care of the patient were reviewed by me and considered in my medical decision making (see chart for details).  Clinical Course as of Dec 11 8  Mon Dec 11, 2019  2052 D/w Mackie Pai - rec dc with close out pt f/u   [RD]    Clinical Course User Index [RD] Milagros Loll, MD   MDM Rules/Calculators/A&P                          69 year old male history of nonischemic cardiomyopathy, atrial fibrillation presents ER with concern for palpitations.  On initial EKG, patient in A. fib with RVR.  After patient was in ER for a brief period of time, his heart rate spontaneously came down and remained 70s to 90s during period of observation.  Repeat EKG confirmed still in A. Fib.  Labs grossly stable, borderline hypomagnesemia.  Reviewed case in detail with cardiology  on-call, Dr. Mackie Pai. As patient is currently asymptoamtic and rate is controlled, he recommends discharge with close outpatient follow-up with his primary cardiologist.  Patient is agreeable to this plan.  I reviewed return precautions in detail with patient, need for monitoring heart rate at home and need for close follow-up.    After the discussed management above, the patient was determined to be safe for discharge.  The patient was in agreement with this plan and all questions regarding their care were answered.  ED return precautions were discussed and the patient will return to the ED with any significant worsening of condition.   Final Clinical Impression(s) / ED Diagnoses Final diagnoses:  Paroxysmal atrial fibrillation [facility])    Rx / DC Orders ED Discharge Orders    None       Milagros Loll, MD 12/12/19 0010

## 2019-12-12 ENCOUNTER — Encounter (HOSPITAL_COMMUNITY): Payer: Self-pay | Admitting: Nurse Practitioner

## 2019-12-12 ENCOUNTER — Other Ambulatory Visit: Payer: Self-pay

## 2019-12-12 ENCOUNTER — Ambulatory Visit (HOSPITAL_COMMUNITY)
Admission: RE | Admit: 2019-12-12 | Discharge: 2019-12-12 | Disposition: A | Discharge status: Home / Self Care | Payer: BC Managed Care – PPO | Source: Ambulatory Visit | Attending: Nurse Practitioner | Admitting: Nurse Practitioner

## 2019-12-12 ENCOUNTER — Telehealth: Payer: Self-pay | Admitting: Cardiovascular Disease

## 2019-12-12 VITALS — BP 114/70 | HR 92 | Ht 76.0 in | Wt 267.0 lb

## 2019-12-12 DIAGNOSIS — Z79899 Other long term (current) drug therapy: Secondary | ICD-10-CM | POA: Diagnosis not present

## 2019-12-12 DIAGNOSIS — Z79811 Long term (current) use of aromatase inhibitors: Secondary | ICD-10-CM | POA: Diagnosis not present

## 2019-12-12 DIAGNOSIS — Z7983 Long term (current) use of bisphosphonates: Secondary | ICD-10-CM | POA: Diagnosis not present

## 2019-12-12 DIAGNOSIS — Z888 Allergy status to other drugs, medicaments and biological substances status: Secondary | ICD-10-CM | POA: Insufficient documentation

## 2019-12-12 DIAGNOSIS — D6869 Other thrombophilia: Secondary | ICD-10-CM | POA: Diagnosis not present

## 2019-12-12 DIAGNOSIS — Z7901 Long term (current) use of anticoagulants: Secondary | ICD-10-CM | POA: Insufficient documentation

## 2019-12-12 DIAGNOSIS — G4733 Obstructive sleep apnea (adult) (pediatric): Secondary | ICD-10-CM | POA: Insufficient documentation

## 2019-12-12 DIAGNOSIS — I428 Other cardiomyopathies: Secondary | ICD-10-CM | POA: Insufficient documentation

## 2019-12-12 DIAGNOSIS — Z7982 Long term (current) use of aspirin: Secondary | ICD-10-CM | POA: Insufficient documentation

## 2019-12-12 DIAGNOSIS — I4891 Unspecified atrial fibrillation: Secondary | ICD-10-CM | POA: Insufficient documentation

## 2019-12-12 MED ORDER — CARVEDILOL 12.5 MG PO TABS: 6.2500 mg | ORAL_TABLET | Freq: Two times a day (BID) | ORAL | 11 refills | Status: DC

## 2019-12-12 NOTE — Progress Notes (Signed)
Primary Care Physician: Ruben Au, MD Referring Physician: Dr. Melburn [name] EP: Dr. Mariea [name] is a 69 y.o. male with a h/o NICM and afib. He was hospitalized in February 2012 and was critically ill with severe systolic left ventricular dysfunction and atrial fibrillation. He was successfully cardioverted back to sinus rhythm in May 2012. He did well until August 2014 when he went back into atrial fibrillation after  Amiodarone reduced to 100 mg daily. His dose was increased and he was cardioverted him successfully on 03/02/13. Since then he has maintained normal sinus rhythm. His echocardiogram in 01/14/13 showed that his ejection fraction had improved to 50-55%.  Due to concerns of long term complications of amiodarone, the dose of drug was decreased, but with subsequent return of atrial fibrillation. He  was in the afib clinic 05/22/16 to discuss options. He is fairly comfortable in afib, and is rate controlled at rest, but does notice that his heart rate will increase to 120 bpm with exertion.  He does not drink alcohol, no excessive caffeine, no tobacco. He is obese, does not exercise and admits that he eats a lot of junk food. He does have OSA and uses CPAP.  He eventually had amiodarone washout and was hospitalized in April for tikosyn.  F/u afib clinic,from tikosyn loading, 4/16-1/19.Marland Kitchen He did well with stable qtc, remains stable today at 446 ms. He is slow today at 49 bpm but not symptomatic.He feels better in SR with more energy.  Being seen in afib clinic 04/27/17. He has had persistent  Afib 12/19, notices more fatigue. He is interested in having cardioversion. No known triggers.  F/u in afib clinic 04/15/18. He felt that he went into afib last weekend. He did this back in late December 2018 /early January 2019  and went back into NSR while awaiting to get cardioverted. He has not missed any tikosyn. He is rate controlled. 114 bpm on EKG but with quiet sitting,  his HR's were in the 60/70's. He will need weekly INR's.  F/u 05/19/2018,he had successful cardioversion after 4 in range INR's and remains in sinus brady at 45 bpm. He feels well. His BB had been increased for  afib rate control while out of rhythm. He will go back to 6.25 mg bid. He feels improved.  F/u in afib clinic, 8/24. He  asked to be seen as he went into afib last night. He felt dizzy, vision blurred and slight  nausea for a few minutes, this passed and then he realized he was in afib. He went to [facility] and since he was stable, rate controlled, he was d/c home to f/u with cardiology. He is rate controlled today. He continues on Tikosyn. His BB was stopped a few months ago for bradycardia. He continues on warfarin with a CHA2DS2VASc score of at least 2. He appears stable.  Today, he denies symptoms of  chest pain, orthopnea, PND, lower extremity edema, dizziness, presyncope, syncope, or neurologic sequela.   The patient is tolerating medications without difficulties and is otherwise without complaint today.   Past Medical History:  Diagnosis Date  . Atrial fibrillation (HCC)   . Cough   . Non-ischemic cardiomyopathy (HCC)       . OSA (obstructive sleep apnea)    Past Surgical History:  Procedure Laterality Date  . CARDIOVERSION N/A 03/02/2013   Procedure: CARDIOVERSION;  Surgeon: Ruben Clement, MD;  Location: [facility] ENDOSCOPY;  Service: Cardiovascular;  Laterality: N/A;  .  CARDIOVERSION N/A 08/05/2016   Procedure: CARDIOVERSION;  Surgeon: Ruben Si, MD;  Location: [facility] ENDOSCOPY;  Service: Cardiovascular;  Laterality: N/A;  . CARDIOVERSION N/A 05/09/2018   Procedure: CARDIOVERSION;  Surgeon: Ruben Red, MD;  Location: [facility] ENDOSCOPY;  Service: Cardiovascular;  Laterality: N/A;  . FOOT FRACTURE SURGERY    . PILONIDAL CYST EXCISION      Current Outpatient Medications  Medication Sig Dispense Refill  . acetaminophen (TYLENOL) 500 MG tablet Take 1,000 mg by  mouth every 8 (eight) hours as needed for mild pain or headache.    . dofetilide (TIKOSYN) 500 MCG capsule Take 1 capsule (500 mcg total) by mouth 2 (two) times daily. 180 capsule 2  . guaiFENesin (MUCINEX) 600 MG 12 hr tablet Take 600 mg by mouth 2 (two) times daily as needed for cough or to loosen phlegm.    . hydrALAZINE (APRESOLINE) 25 MG tablet TAKE 1/2 (ONE-HALF) TABLET BY MOUTH THREE TIMES DAILY 135 tablet 3  . lisinopril (ZESTRIL) 5 MG tablet Take 1 tablet by mouth once daily 90 tablet 2  . magnesium oxide (MAG-OX) 400 (241.3 Mg) MG tablet Take 1 tablet by mouth twice daily 60 tablet 11  . pravastatin (PRAVACHOL) 20 MG tablet Take 20 mg by mouth daily.    Marland Kitchen PREVIDENT 5000 BOOSTER PLUS 1.1 % PSTE APPLY A PEA SIZED AMOUNT TO BRUSH 1 TO 2 MINUTES JUST BEFORE BEDTIME. SPIT DO NOT RINSE OR EAT AFTER.    Marland Kitchen warfarin (COUMADIN) 2 MG tablet TAKE AS DIRECTED BY  COUMADIN  CLINIC 65 tablet 3   No current facility-administered medications for this encounter.    No Known Allergies  Social History   Socioeconomic History  . Marital status: Married    Spouse name: Not on file  . Number of children: Not on file  . Years of education: Not on file  . Highest education level: Not on file  Occupational History  . Not on file  Tobacco Use  . Smoking status: Never Smoker  . Smokeless tobacco: Never Used  Vaping Use  . Vaping Use: Never used  Substance and Sexual Activity  . Alcohol use: No  . Drug use: No  . Sexual activity: Yes    Birth control/protection: Other-see comments  Other Topics Concern  . Not on file  Social History Narrative  . Not on file   Social Determinants of Health   Financial Resource Strain:   . Difficulty of Paying Living Expenses: Not on file  Food Insecurity:   . Worried About Programme researcher, broadcasting/film/video in the Last Year: Not on file  . Ran Out of Food in the Last Year: Not on file  Transportation Needs:   . Lack of Transportation (Medical): Not on file  . Lack of  Transportation (Non-Medical): Not on file  Physical Activity:   . Days of Exercise per Week: Not on file  . Minutes of Exercise per Session: Not on file  Stress:   . Feeling of Stress : Not on file  Social Connections:   . Frequency of Communication with Friends and Family: Not on file  . Frequency of Social Gatherings with Friends and Family: Not on file  . Attends Religious Services: Not on file  . Active Member of Clubs or Organizations: Not on file  . Attends Banker Meetings: Not on file  . Marital Status: Not on file  Intimate Partner Violence:   . Fear of Current or Ex-Partner: Not on file  . Emotionally  Abused: Not on file  . Physically Abused: Not on file  . Sexually Abused: Not on file    Family History  Problem Relation Age of Onset  . Cancer Mother   . Stroke Mother   . Hypertension Mother   . Memory loss Mother   . Prostate cancer Father     ROS- All systems are reviewed and negative except as per the HPI above  Physical Exam: Vitals:   12/12/19 1521  Weight: 121.1 kg  Height: 6\' 4"  (1.93 m)   Wt Readings from Last 3 Encounters:  12/12/19 121.1 kg  12/11/19 122.5 kg  08/01/19 122.9 kg    Labs: Lab Results  Component Value Date   NA 136 12/11/2019   K 3.8 12/11/2019   CL 104 12/11/2019   CO2 22 12/11/2019   GLUCOSE 103 (H) 12/11/2019   BUN 16 12/11/2019   CREATININE 1.22 12/11/2019   CALCIUM 8.6 (L) 12/11/2019   PHOS 3.9 05/09/2010   MG 1.9 12/11/2019   Lab Results  Component Value Date   INR 2.0 (H) 12/11/2019   Lab Results  Component Value Date   CHOL 168 07/31/2019   HDL 37 (L) 07/31/2019   LDLCALC 96 07/31/2019   TRIG 203 (H) 07/31/2019     GEN- The patient is well appearing, alert and oriented x 3 today.   Head- normocephalic, atraumatic Eyes-  Sclera clear, conjunctiva pink Ears- hearing intact Oropharynx- clear Neck- supple, no JVP Lymph- no cervical lymphadenopathy Lungs- Clear to ausculation bilaterally,  normal work of breathing Heart-  irregular rate and rhythm, no murmurs, rubs or gallops, PMI not laterally displaced GI- soft, NT, ND, + BS Extremities- no clubbing, cyanosis, or edema MS- no significant deformity or atrophy Skin- no rash or lesion Psych- euthymic mood, full affect Neuro- strength and sensation are intact  EKG- afib at 92 bpm, qrs int 102 ms, qtc 474 ms Echo-2014-Study Conclusions  - Left ventricle: The cavity size was normal. There was mild focal basal hypertrophy of the septum. Systolic function was normal. The estimated ejection fraction was in the range of 50% to 55%. Wall motion was normal; there were no regional wall motion abnormalities. - Ascending aorta: The ascending aorta was mildly dilated. - Mitral valve: Mild prolapse, involving the anterior leaflet and the posterior leaflet. Mild regurgitation. - Left atrium: The atrium was mildly dilated. - Right atrium: The atrium was mildly dilated. INR- 1.7, 2/27     Assessment and Plan: 1. Afib On tikosyn since April 2018, with return of afib last night No known trigger Will try to add  coreg back to 6.125 mg bid in case it may convert pt to SR  Hold lisinopril for now for  more BP to add BB  2. H/o NICM Echo updated 06/01/16- EF 55-60%   I will see back in one week, if still in afib, will plan for cardioversion after 4 therapeutic  weekly INR's   Ruben [name] C. 07/30/16 Afib Clinic [facility] [address] [place], [place] [phone]

## 2019-12-12 NOTE — Patient Instructions (Signed)
Hold lisinopril  Resume Coreg 1/2 tablet twice a day

## 2019-12-12 NOTE — Telephone Encounter (Addendum)
Spoke with Ruben [name] and on way home from work around 6:50 pm yesterday  noted a dizzy spell and HR elevated Ruben [name] [facility] and was in afib Per Ruben [name] remains in afib feels lightheaded and has SOB with activity Ruben [name] requesting to be seen Appt scheduled with Sebastian Ache NP today at 3:30 pm Will forward to Dr [name] for review./cy

## 2019-12-12 NOTE — Telephone Encounter (Signed)
Patient c/o Palpitations:  High priority if patient c/o lightheadedness, shortness of breath, or chest pain  1) How long have you had palpitations/irregular HR/ Afib? Started yesterday evening. Are you having the symptoms now? yes  2) Are you currently experiencing lightheadedness, SOB or CP? NO   3) Do you have a history of afib (atrial fibrillation) or irregular heart rhythm? yes  4) Have you checked your BP or HR? (document readings if available): HR 80  5) Are you experiencing any other symptoms? No  He states that he was checked out last night at the [facility]

## 2019-12-14 NOTE — Telephone Encounter (Signed)
Seen in afib clinic with plan to return 8/31.

## 2019-12-19 ENCOUNTER — Other Ambulatory Visit: Payer: Self-pay

## 2019-12-19 ENCOUNTER — Ambulatory Visit (HOSPITAL_COMMUNITY)
Admission: RE | Admit: 2019-12-19 | Discharge: 2019-12-19 | Disposition: A | Discharge status: Home / Self Care | Payer: BC Managed Care – PPO | Source: Ambulatory Visit | Attending: Nurse Practitioner | Admitting: Nurse Practitioner

## 2019-12-19 ENCOUNTER — Encounter (HOSPITAL_COMMUNITY): Payer: Self-pay | Admitting: Nurse Practitioner

## 2019-12-19 VITALS — BP 122/90 | HR 69 | Ht 76.0 in | Wt 268.4 lb

## 2019-12-19 DIAGNOSIS — D6869 Other thrombophilia: Secondary | ICD-10-CM

## 2019-12-19 DIAGNOSIS — Z7901 Long term (current) use of anticoagulants: Secondary | ICD-10-CM | POA: Insufficient documentation

## 2019-12-19 DIAGNOSIS — Z8249 Family history of ischemic heart disease and other diseases of the circulatory system: Secondary | ICD-10-CM | POA: Insufficient documentation

## 2019-12-19 DIAGNOSIS — I428 Other cardiomyopathies: Secondary | ICD-10-CM | POA: Insufficient documentation

## 2019-12-19 DIAGNOSIS — Z79899 Other long term (current) drug therapy: Secondary | ICD-10-CM | POA: Insufficient documentation

## 2019-12-19 DIAGNOSIS — I4819 Other persistent atrial fibrillation: Secondary | ICD-10-CM

## 2019-12-19 LAB — CBC
HCT: 43.7 % (ref 39.0–52.0)
Hemoglobin: 14.5 g/dL (ref 13.0–17.0)
MCH: 31.6 pg (ref 26.0–34.0)
MCHC: 33.2 g/dL (ref 30.0–36.0)
MCV: 95.2 fL (ref 80.0–100.0)
Platelets: 307 10*3/uL (ref 150–400)
RBC: 4.59 MIL/uL (ref 4.22–5.81)
RDW: 12.7 % (ref 11.5–15.5)
WBC: 6 10*3/uL (ref 4.0–10.5)
nRBC: 0 % (ref 0.0–0.2)

## 2019-12-19 LAB — PROTIME-INR
INR: 2 — ABNORMAL HIGH (ref 0.8–1.2)
Prothrombin Time: 22.3 seconds — ABNORMAL HIGH (ref 11.4–15.2)

## 2019-12-19 LAB — BASIC METABOLIC PANEL
Anion gap: 6 (ref 5–15)
BUN: 13 mg/dL (ref 8–23)
CO2: 25 mmol/L (ref 22–32)
Calcium: 8.6 mg/dL — ABNORMAL LOW (ref 8.9–10.3)
Chloride: 106 mmol/L (ref 98–111)
Creatinine, Ser: 1.22 mg/dL (ref 0.61–1.24)
GFR calc Af Amer: >60 mL/min (ref >60)
GFR calc non Af Amer: >60 mL/min (ref >60)
Glucose, Bld: 104 mg/dL — ABNORMAL HIGH (ref 70–99)
Potassium: 4.4 mmol/L (ref 3.5–5.1)
Sodium: 137 mmol/L (ref 135–145)

## 2019-12-19 NOTE — H&P (View-Only) (Signed)
Primary Care Physician: Verlon Au, MD Referring Physician: Dr. 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 male with a h/o NICM and afib. He was hospitalized in February 2012 and was critically ill with severe systolic left ventricular dysfunction and atrial fibrillation. He was successfully cardioverted back to sinus rhythm in May 2012. He did well until August 2014 when he went back into atrial fibrillation after  Amiodarone reduced to 100 mg daily. His dose was increased and he was cardioverted him successfully on 03/02/13. Since then he has maintained normal sinus rhythm. His echocardiogram in 01/14/13 showed that his ejection fraction had improved to 50-55%.  Due to concerns of long term complications of amiodarone, the dose of drug was decreased, but with subsequent return of atrial fibrillation. He  was in the afib clinic 05/22/16 to discuss options. He is fairly comfortable in afib, and is rate controlled at rest, but does notice that his heart rate will increase to 120 bpm with exertion.  He does not drink alcohol, no excessive caffeine, no tobacco. He is obese, does not exercise and admits that he eats a lot of junk food. He does have OSA and uses CPAP.  He eventually had amiodarone washout and was hospitalized in April for tikosyn.  F/u afib clinic,from tikosyn loading, 4/16-1/19.Marland Kitchen He did well with stable qtc, remains stable today at 446 ms. He is slow today at 49 bpm but not symptomatic.He feels better in SR with more energy.  Being seen in afib clinic 04/27/17. He has had persistent  Afib 12/19, notices more fatigue. He is interested in having cardioversion. No known triggers.  F/u in afib clinic 04/15/18. He felt that he went into afib last weekend. He did this back in late December 2018 /early January 2019  and went back into NSR while awaiting to get cardioverted. He has not missed any tikosyn. He is rate controlled. 114 bpm on EKG but with quiet sitting,  his HR's were in the 60/70's. He will need weekly INR's.  F/u 05/19/2018,he had successful cardioversion after 4 in range INR's and remains in sinus brady at 45 bpm. He feels well. His BB had been increased for  afib rate control while out of rhythm. He will go back to 6.25 mg bid. He feels improved.  F/u in afib clinic, 8/24. He  asked to be seen as he went into afib last night. He felt dizzy, vision blurred and slight  nausea for a few minutes, this passed and then he realized he was in afib. He went to [facility] and since he was stable, rate controlled, he was d/c home to f/u with cardiology. He is rate controlled today. He continues on Tikosyn. His BB was stopped a few months ago for bradycardia. He continues on warfarin with a CHA2DS2VASc score of at least 2. He appears stable.  F/u in afib clinic, 8/31. He remains in afib despite restarting BB. We discussed cardioversion with the 4 weekly INR's vrs TEE guided DCCV. He says that he feels OK so he is willing to wait for the cardioversion.   Today, he denies symptoms of  chest pain, orthopnea, PND, lower extremity edema, dizziness, presyncope, syncope, or neurologic sequela.   The patient is tolerating medications without difficulties and is otherwise without complaint today.   Past Medical History:  Diagnosis Date  . Atrial fibrillation (HCC)   . Cough   . Non-ischemic cardiomyopathy (HCC)       .  OSA (obstructive sleep apnea)    Past Surgical History:  Procedure Laterality Date  . CARDIOVERSION N/A 03/02/2013   Procedure: CARDIOVERSION;  Surgeon: [name], MD;  Location: [facility] ENDOSCOPY;  Service: Cardiovascular;  Laterality: N/A;  . CARDIOVERSION N/A 08/05/2016   Procedure: CARDIOVERSION;  Surgeon: Chilton Si, MD;  Location: [facility] ENDOSCOPY;  Service: Cardiovascular;  Laterality: N/A;  . CARDIOVERSION N/A 05/09/2018   Procedure: CARDIOVERSION;  Surgeon: Jodelle Red, MD;  Location: [facility] ENDOSCOPY;  Service:  Cardiovascular;  Laterality: N/A;  . FOOT FRACTURE SURGERY    . PILONIDAL CYST EXCISION      Current Outpatient Medications  Medication Sig Dispense Refill  . acetaminophen (TYLENOL) 500 MG tablet Take 1,000 mg by mouth every 8 (eight) hours as needed for mild pain or headache.    . carvedilol (COREG) 12.5 MG tablet Take 0.5 tablets (6.25 mg total) by mouth 2 (two) times daily. 60 tablet 11  . dofetilide (TIKOSYN) 500 MCG capsule Take 1 capsule (500 mcg total) by mouth 2 (two) times daily. 180 capsule 2  . guaiFENesin (MUCINEX) 600 MG 12 hr tablet Take 600 mg by mouth 2 (two) times daily as needed for cough or to loosen phlegm.    . hydrALAZINE (APRESOLINE) 25 MG tablet TAKE 1/2 (ONE-HALF) TABLET BY MOUTH THREE TIMES DAILY 135 tablet 3  . magnesium oxide (MAG-OX) 400 (241.3 Mg) MG tablet Take 1 tablet by mouth twice daily 60 tablet 11  . pravastatin (PRAVACHOL) 20 MG tablet Take 20 mg by mouth daily.    Marland Kitchen PREVIDENT 5000 BOOSTER PLUS 1.1 % PSTE APPLY A PEA SIZED AMOUNT TO BRUSH 1 TO 2 MINUTES JUST BEFORE BEDTIME. SPIT DO NOT RINSE OR EAT AFTER.    Marland Kitchen warfarin (COUMADIN) 2 MG tablet TAKE AS DIRECTED BY  COUMADIN  CLINIC 65 tablet 3  . lisinopril (ZESTRIL) 5 MG tablet Take 1 tablet by mouth once daily (Patient not taking: Reported on 12/19/2019) 90 tablet 2   No current facility-administered medications for this encounter.    No Known Allergies  Social History   Socioeconomic History  . Marital status: Married    Spouse name: Not on file  . Number of children: Not on file  . Years of education: Not on file  . Highest education level: Not on file  Occupational History  . Not on file  Tobacco Use  . Smoking status: Never Smoker  . Smokeless tobacco: Never Used  Vaping Use  . Vaping Use: Never used  Substance and Sexual Activity  . Alcohol use: No  . Drug use: No  . Sexual activity: Yes    Birth control/protection: Other-see comments  Other Topics Concern  . Not on file  Social  History Narrative  . Not on file   Social Determinants of Health   Financial Resource Strain:   . Difficulty of Paying Living Expenses: Not on file  Food Insecurity:   . Worried About Programme researcher, broadcasting/film/video in the Last Year: Not on file  . Ran Out of Food in the Last Year: Not on file  Transportation Needs:   . Lack of Transportation (Medical): Not on file  . Lack of Transportation (Non-Medical): Not on file  Physical Activity:   . Days of Exercise per Week: Not on file  . Minutes of Exercise per Session: Not on file  Stress:   . Feeling of Stress : Not on file  Social Connections:   . Frequency of Communication with Friends and Family: Not on  file  . Frequency of Social Gatherings with Friends and Family: Not on file  . Attends Religious Services: Not on file  . Active Member of Clubs or Organizations: Not on file  . Attends Banker Meetings: Not on file  . Marital Status: Not on file  Intimate Partner Violence:   . Fear of Current or Ex-Partner: Not on file  . Emotionally Abused: Not on file  . Physically Abused: Not on file  . Sexually Abused: Not on file    Family History  Problem Relation Age of Onset  . Cancer Mother   . Stroke Mother   . Hypertension Mother   . Memory loss Mother   . Prostate cancer Father     ROS- All systems are reviewed and negative except as per the HPI above  Physical Exam: Vitals:   12/19/19 1540  BP: 122/90  Pulse: 69  Weight: 121.7 kg  Height: 6\' 4"  (1.93 m)   Wt Readings from Last 3 Encounters:  12/19/19 121.7 kg  12/12/19 121.1 kg  12/11/19 122.5 kg    Labs: Lab Results  Component Value Date   NA 136 12/11/2019   K 3.8 12/11/2019   CL 104 12/11/2019   CO2 22 12/11/2019   GLUCOSE 103 (H) 12/11/2019   BUN 16 12/11/2019   CREATININE 1.22 12/11/2019   CALCIUM 8.6 (L) 12/11/2019   PHOS 3.9 05/09/2010   MG 1.9 12/11/2019   Lab Results  Component Value Date   INR 2.0 (H) 12/11/2019   Lab Results    Component Value Date   CHOL 168 07/31/2019   HDL 37 (L) 07/31/2019   LDLCALC 96 07/31/2019   TRIG 203 (H) 07/31/2019     GEN- The patient is well appearing, alert and oriented x 3 today.   Head- normocephalic, atraumatic Eyes-  Sclera clear, conjunctiva pink Ears- hearing intact Oropharynx- clear Neck- supple, no JVP Lymph- no cervical lymphadenopathy Lungs- Clear to ausculation bilaterally, normal work of breathing Heart-  irregular rate and rhythm, no murmurs, rubs or gallops, PMI not laterally displaced GI- soft, NT, ND, + BS Extremities- no clubbing, cyanosis, or edema MS- no significant deformity or atrophy Skin- no rash or lesion Psych- euthymic mood, full affect Neuro- strength and sensation are intact  EKG- afib at 69  bpm, qrs int 106 ms, qtc 492 ms  Echo-2014-Study Conclusions  - Left ventricle: The cavity size was normal. There was mild focal basal hypertrophy of the septum. Systolic function was normal. The estimated ejection fraction was in the range of 50% to 55%. Wall motion was normal; there were no regional wall motion abnormalities. - Ascending aorta: The ascending aorta was mildly dilated. - Mitral valve: Mild prolapse, involving the anterior leaflet and the posterior leaflet. Mild regurgitation. - Left atrium: The atrium was mildly dilated. - Right atrium: The atrium was mildly dilated. INR- 1.7, 2/27     Assessment and Plan: 1. Afib On tikosyn since April 2018, with return of afib last week  No known trigger Added  coreg back to 6.125 mg bid, did not convert, did slow v rate  Will continue on this but stop am cardioversion and resume lisinopril  Hold lisinopril for now for  more BP to add BB Continue  tikosyn 500 mcg bid  Cbc/bmet/INR today  Has had both covid vaccines   2. H/o NICM Echo updated 06/01/16- EF 55-60%  3. CHA2DS2VASc score of at least 2 Coumadin clinic to be notified to get INR's weekly  for the next 2 weeks  Once  these INR's are received and therapeutic will schedule DCCV  Aulani Shipton C. Zenith Lamphier, ANP-C Afib Clinic [facility] [address] [place], [place] [address] [phone]         

## 2019-12-19 NOTE — Progress Notes (Addendum)
Primary Care Physician: Verlon Au, MD Referring Physician: Dr. 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 male with a h/o NICM and afib. He was hospitalized in February 2012 and was critically ill with severe systolic left ventricular dysfunction and atrial fibrillation. He was successfully cardioverted back to sinus rhythm in May 2012. He did well until August 2014 when he went back into atrial fibrillation after  Amiodarone reduced to 100 mg daily. His dose was increased and he was cardioverted him successfully on 03/02/13. Since then he has maintained normal sinus rhythm. His echocardiogram in 01/14/13 showed that his ejection fraction had improved to 50-55%.  Due to concerns of long term complications of amiodarone, the dose of drug was decreased, but with subsequent return of atrial fibrillation. He  was in the afib clinic 05/22/16 to discuss options. He is fairly comfortable in afib, and is rate controlled at rest, but does notice that his heart rate will increase to 120 bpm with exertion.  He does not drink alcohol, no excessive caffeine, no tobacco. He is obese, does not exercise and admits that he eats a lot of junk food. He does have OSA and uses CPAP.  He eventually had amiodarone washout and was hospitalized in April for tikosyn.  F/u afib clinic,from tikosyn loading, 4/16-1/19.Marland Kitchen He did well with stable qtc, remains stable today at 446 ms. He is slow today at 49 bpm but not symptomatic.He feels better in SR with more energy.  Being seen in afib clinic 04/27/17. He has had persistent  Afib 12/19, notices more fatigue. He is interested in having cardioversion. No known triggers.  F/u in afib clinic 04/15/18. He felt that he went into afib last weekend. He did this back in late December 2018 /early January 2019  and went back into NSR while awaiting to get cardioverted. He has not missed any tikosyn. He is rate controlled. 114 bpm on EKG but with quiet sitting,  his HR's were in the 60/70's. He will need weekly INR's.  F/u 05/19/2018,he had successful cardioversion after 4 in range INR's and remains in sinus brady at 45 bpm. He feels well. His BB had been increased for  afib rate control while out of rhythm. He will go back to 6.25 mg bid. He feels improved.  F/u in afib clinic, 8/24. He  asked to be seen as he went into afib last night. He felt dizzy, vision blurred and slight  nausea for a few minutes, this passed and then he realized he was in afib. He went to [facility] and since he was stable, rate controlled, he was d/c home to f/u with cardiology. He is rate controlled today. He continues on Tikosyn. His BB was stopped a few months ago for bradycardia. He continues on warfarin with a CHA2DS2VASc score of at least 2. He appears stable.  F/u in afib clinic, 8/31. He remains in afib despite restarting BB. We discussed cardioversion with the 4 weekly INR's vrs TEE guided DCCV. He says that he feels OK so he is willing to wait for the cardioversion.   Today, he denies symptoms of  chest pain, orthopnea, PND, lower extremity edema, dizziness, presyncope, syncope, or neurologic sequela.   The patient is tolerating medications without difficulties and is otherwise without complaint today.   Past Medical History:  Diagnosis Date  . Atrial fibrillation (HCC)   . Cough   . Non-ischemic cardiomyopathy (HCC)       .  OSA (obstructive sleep apnea)    Past Surgical History:  Procedure Laterality Date  . CARDIOVERSION N/A 03/02/2013   Procedure: CARDIOVERSION;  Surgeon: [name], MD;  Location: [facility] ENDOSCOPY;  Service: Cardiovascular;  Laterality: N/A;  . CARDIOVERSION N/A 08/05/2016   Procedure: CARDIOVERSION;  Surgeon: Chilton Si, MD;  Location: [facility] ENDOSCOPY;  Service: Cardiovascular;  Laterality: N/A;  . CARDIOVERSION N/A 05/09/2018   Procedure: CARDIOVERSION;  Surgeon: Jodelle Red, MD;  Location: [facility] ENDOSCOPY;  Service:  Cardiovascular;  Laterality: N/A;  . FOOT FRACTURE SURGERY    . PILONIDAL CYST EXCISION      Current Outpatient Medications  Medication Sig Dispense Refill  . acetaminophen (TYLENOL) 500 MG tablet Take 1,000 mg by mouth every 8 (eight) hours as needed for mild pain or headache.    . carvedilol (COREG) 12.5 MG tablet Take 0.5 tablets (6.25 mg total) by mouth 2 (two) times daily. 60 tablet 11  . dofetilide (TIKOSYN) 500 MCG capsule Take 1 capsule (500 mcg total) by mouth 2 (two) times daily. 180 capsule 2  . guaiFENesin (MUCINEX) 600 MG 12 hr tablet Take 600 mg by mouth 2 (two) times daily as needed for cough or to loosen phlegm.    . hydrALAZINE (APRESOLINE) 25 MG tablet TAKE 1/2 (ONE-HALF) TABLET BY MOUTH THREE TIMES DAILY 135 tablet 3  . magnesium oxide (MAG-OX) 400 (241.3 Mg) MG tablet Take 1 tablet by mouth twice daily 60 tablet 11  . pravastatin (PRAVACHOL) 20 MG tablet Take 20 mg by mouth daily.    Marland Kitchen PREVIDENT 5000 BOOSTER PLUS 1.1 % PSTE APPLY A PEA SIZED AMOUNT TO BRUSH 1 TO 2 MINUTES JUST BEFORE BEDTIME. SPIT DO NOT RINSE OR EAT AFTER.    Marland Kitchen warfarin (COUMADIN) 2 MG tablet TAKE AS DIRECTED BY  COUMADIN  CLINIC 65 tablet 3  . lisinopril (ZESTRIL) 5 MG tablet Take 1 tablet by mouth once daily (Patient not taking: Reported on 12/19/2019) 90 tablet 2   No current facility-administered medications for this encounter.    No Known Allergies  Social History   Socioeconomic History  . Marital status: Married    Spouse name: Not on file  . Number of children: Not on file  . Years of education: Not on file  . Highest education level: Not on file  Occupational History  . Not on file  Tobacco Use  . Smoking status: Never Smoker  . Smokeless tobacco: Never Used  Vaping Use  . Vaping Use: Never used  Substance and Sexual Activity  . Alcohol use: No  . Drug use: No  . Sexual activity: Yes    Birth control/protection: Other-see comments  Other Topics Concern  . Not on file  Social  History Narrative  . Not on file   Social Determinants of Health   Financial Resource Strain:   . Difficulty of Paying Living Expenses: Not on file  Food Insecurity:   . Worried About Programme researcher, broadcasting/film/video in the Last Year: Not on file  . Ran Out of Food in the Last Year: Not on file  Transportation Needs:   . Lack of Transportation (Medical): Not on file  . Lack of Transportation (Non-Medical): Not on file  Physical Activity:   . Days of Exercise per Week: Not on file  . Minutes of Exercise per Session: Not on file  Stress:   . Feeling of Stress : Not on file  Social Connections:   . Frequency of Communication with Friends and Family: Not on  file  . Frequency of Social Gatherings with Friends and Family: Not on file  . Attends Religious Services: Not on file  . Active Member of Clubs or Organizations: Not on file  . Attends Banker Meetings: Not on file  . Marital Status: Not on file  Intimate Partner Violence:   . Fear of Current or Ex-Partner: Not on file  . Emotionally Abused: Not on file  . Physically Abused: Not on file  . Sexually Abused: Not on file    Family History  Problem Relation Age of Onset  . Cancer Mother   . Stroke Mother   . Hypertension Mother   . Memory loss Mother   . Prostate cancer Father     ROS- All systems are reviewed and negative except as per the HPI above  Physical Exam: Vitals:   12/19/19 1540  BP: 122/90  Pulse: 69  Weight: 121.7 kg  Height: 6\' 4"  (1.93 m)   Wt Readings from Last 3 Encounters:  12/19/19 121.7 kg  12/12/19 121.1 kg  12/11/19 122.5 kg    Labs: Lab Results  Component Value Date   NA 136 12/11/2019   K 3.8 12/11/2019   CL 104 12/11/2019   CO2 22 12/11/2019   GLUCOSE 103 (H) 12/11/2019   BUN 16 12/11/2019   CREATININE 1.22 12/11/2019   CALCIUM 8.6 (L) 12/11/2019   PHOS 3.9 05/09/2010   MG 1.9 12/11/2019   Lab Results  Component Value Date   INR 2.0 (H) 12/11/2019   Lab Results    Component Value Date   CHOL 168 07/31/2019   HDL 37 (L) 07/31/2019   LDLCALC 96 07/31/2019   TRIG 203 (H) 07/31/2019     GEN- The patient is well appearing, alert and oriented x 3 today.   Head- normocephalic, atraumatic Eyes-  Sclera clear, conjunctiva pink Ears- hearing intact Oropharynx- clear Neck- supple, no JVP Lymph- no cervical lymphadenopathy Lungs- Clear to ausculation bilaterally, normal work of breathing Heart-  irregular rate and rhythm, no murmurs, rubs or gallops, PMI not laterally displaced GI- soft, NT, ND, + BS Extremities- no clubbing, cyanosis, or edema MS- no significant deformity or atrophy Skin- no rash or lesion Psych- euthymic mood, full affect Neuro- strength and sensation are intact  EKG- afib at 69  bpm, qrs int 106 ms, qtc 492 ms  Echo-2014-Study Conclusions  - Left ventricle: The cavity size was normal. There was mild focal basal hypertrophy of the septum. Systolic function was normal. The estimated ejection fraction was in the range of 50% to 55%. Wall motion was normal; there were no regional wall motion abnormalities. - Ascending aorta: The ascending aorta was mildly dilated. - Mitral valve: Mild prolapse, involving the anterior leaflet and the posterior leaflet. Mild regurgitation. - Left atrium: The atrium was mildly dilated. - Right atrium: The atrium was mildly dilated. INR- 1.7, 2/27     Assessment and Plan: 1. Afib On tikosyn since April 2018, with return of afib last week  No known trigger Added  coreg back to 6.125 mg bid, did not convert, did slow v rate  Will continue on this but stop am cardioversion and resume lisinopril  Hold lisinopril for now for  more BP to add BB Continue  tikosyn 500 mcg bid  Cbc/bmet/INR today  Has had both covid vaccines   2. H/o NICM Echo updated 06/01/16- EF 55-60%  3. CHA2DS2VASc score of at least 2 Coumadin clinic to be notified to get INR's weekly  for the next 2 weeks  Once  these INR's are received and therapeutic will schedule DCCV  Lupita Leash C. [facility] [address] [place], [place] [address] [phone]

## 2019-12-20 ENCOUNTER — Ambulatory Visit (INDEPENDENT_AMBULATORY_CARE_PROVIDER_SITE_OTHER): Payer: BC Managed Care – PPO | Admitting: Pharmacist

## 2019-12-20 DIAGNOSIS — Z5181 Encounter for therapeutic drug level monitoring: Secondary | ICD-10-CM

## 2019-12-20 DIAGNOSIS — I4819 Other persistent atrial fibrillation: Secondary | ICD-10-CM

## 2019-12-26 ENCOUNTER — Ambulatory Visit (INDEPENDENT_AMBULATORY_CARE_PROVIDER_SITE_OTHER): Payer: BC Managed Care – PPO | Admitting: Pharmacist

## 2019-12-26 ENCOUNTER — Other Ambulatory Visit: Payer: Self-pay

## 2019-12-26 DIAGNOSIS — I4819 Other persistent atrial fibrillation: Secondary | ICD-10-CM

## 2019-12-26 DIAGNOSIS — Z5181 Encounter for therapeutic drug level monitoring: Secondary | ICD-10-CM

## 2019-12-26 LAB — POCT INR: INR: 2.4 (ref 2.0–3.0)

## 2019-12-26 NOTE — Patient Instructions (Addendum)
Description   Continue taking 2 tablets daily. Recheck INR in 1 week. Pending DCCV.  Call us with any concerns or changes. # [phone]     

## 2019-12-28 ENCOUNTER — Other Ambulatory Visit: Payer: Self-pay

## 2019-12-28 ENCOUNTER — Encounter (HOSPITAL_COMMUNITY): Payer: Self-pay | Admitting: *Deleted

## 2019-12-28 ENCOUNTER — Other Ambulatory Visit (HOSPITAL_COMMUNITY): Payer: Self-pay | Admitting: *Deleted

## 2019-12-28 DIAGNOSIS — I4819 Other persistent atrial fibrillation: Secondary | ICD-10-CM

## 2020-01-02 ENCOUNTER — Ambulatory Visit (INDEPENDENT_AMBULATORY_CARE_PROVIDER_SITE_OTHER): Payer: BC Managed Care – PPO | Admitting: *Deleted

## 2020-01-02 ENCOUNTER — Other Ambulatory Visit: Payer: Self-pay

## 2020-01-02 ENCOUNTER — Other Ambulatory Visit: Payer: BC Managed Care – PPO

## 2020-01-02 DIAGNOSIS — Z5181 Encounter for therapeutic drug level monitoring: Secondary | ICD-10-CM

## 2020-01-02 DIAGNOSIS — I4819 Other persistent atrial fibrillation: Secondary | ICD-10-CM

## 2020-01-02 LAB — BASIC METABOLIC PANEL
BUN/Creatinine Ratio: 14 (ref 10–24)
BUN: 15 mg/dL (ref 8–27)
CO2: 24 mmol/L (ref 20–29)
Calcium: 9 mg/dL (ref 8.6–10.2)
Chloride: 104 mmol/L (ref 96–106)
Creatinine, Ser: 1.07 mg/dL (ref 0.76–1.27)
GFR calc Af Amer: 82 mL/min/{1.73_m2} (ref >59)
GFR calc non Af Amer: 71 mL/min/{1.73_m2} (ref >59)
Glucose: 99 mg/dL (ref 65–99)
Potassium: 4.4 mmol/L (ref 3.5–5.2)
Sodium: 140 mmol/L (ref 134–144)

## 2020-01-02 LAB — CBC WITH DIFFERENTIAL/PLATELET
Basophils Absolute: 0.1 10*3/uL (ref 0.0–0.2)
Basos: 1 %
EOS (ABSOLUTE): 0.3 10*3/uL (ref 0.0–0.4)
Eos: 5 %
Hematocrit: 42.9 % (ref 37.5–51.0)
Hemoglobin: 14.6 g/dL (ref 13.0–17.7)
Immature Grans (Abs): 0 10*3/uL (ref 0.0–0.1)
Immature Granulocytes: 0 %
Lymphocytes Absolute: 1.8 10*3/uL (ref 0.7–3.1)
Lymphs: 30 %
MCH: 31.5 pg (ref 26.6–33.0)
MCHC: 34 g/dL (ref 31.5–35.7)
MCV: 93 fL (ref 79–97)
Monocytes Absolute: 0.6 10*3/uL (ref 0.1–0.9)
Monocytes: 10 %
Neutrophils Absolute: 3.3 10*3/uL (ref 1.4–7.0)
Neutrophils: 54 %
Platelets: 261 10*3/uL (ref 150–450)
RBC: 4.63 x10E6/uL (ref 4.14–5.80)
RDW: 12.3 % (ref 11.6–15.4)
WBC: 6.1 10*3/uL (ref 3.4–10.8)

## 2020-01-02 LAB — POCT INR: INR: 2.7 (ref 2.0–3.0)

## 2020-01-02 NOTE — Patient Instructions (Signed)
Description   Continue taking 2 tablets daily. Recheck INR in 1 week. Pending DCCV.  Call us with any concerns or changes. # [phone]- [phone]

## 2020-01-06 ENCOUNTER — Other Ambulatory Visit: Payer: Self-pay | Admitting: Cardiovascular Disease

## 2020-01-06 ENCOUNTER — Other Ambulatory Visit (HOSPITAL_COMMUNITY)
Admission: RE | Admit: 2020-01-06 | Discharge: 2020-01-06 | Disposition: A | Discharge status: Home / Self Care | Payer: BC Managed Care – PPO | Source: Ambulatory Visit | Attending: Cardiovascular Disease | Admitting: Cardiovascular Disease

## 2020-01-06 DIAGNOSIS — Z01812 Encounter for preprocedural laboratory examination: Secondary | ICD-10-CM | POA: Insufficient documentation

## 2020-01-06 DIAGNOSIS — Z20822 Contact with and (suspected) exposure to covid-19: Secondary | ICD-10-CM | POA: Diagnosis not present

## 2020-01-06 LAB — SARS CORONAVIRUS 2 (TAT 6-24 HRS): SARS Coronavirus 2: NEGATIVE

## 2020-01-09 ENCOUNTER — Encounter (HOSPITAL_COMMUNITY): Payer: Self-pay | Admitting: Cardiovascular Disease

## 2020-01-09 ENCOUNTER — Encounter (HOSPITAL_COMMUNITY)
Admission: RE | Disposition: A | Discharge status: Home / Self Care | Payer: Self-pay | Source: Ambulatory Visit | Attending: Cardiovascular Disease

## 2020-01-09 ENCOUNTER — Ambulatory Visit (HOSPITAL_COMMUNITY)
Admission: RE | Admit: 2020-01-09 | Discharge: 2020-01-09 | Disposition: A | Discharge status: Home / Self Care | Payer: BC Managed Care – PPO | Source: Ambulatory Visit | Attending: Cardiovascular Disease | Admitting: Cardiovascular Disease

## 2020-01-09 ENCOUNTER — Ambulatory Visit (HOSPITAL_COMMUNITY): Payer: BC Managed Care – PPO | Admitting: Certified Registered Nurse Anesthetist

## 2020-01-09 ENCOUNTER — Other Ambulatory Visit: Payer: Self-pay

## 2020-01-09 ENCOUNTER — Ambulatory Visit (INDEPENDENT_AMBULATORY_CARE_PROVIDER_SITE_OTHER): Payer: BC Managed Care – PPO | Admitting: Pharmacist

## 2020-01-09 DIAGNOSIS — I4819 Other persistent atrial fibrillation: Secondary | ICD-10-CM

## 2020-01-09 DIAGNOSIS — I4891 Unspecified atrial fibrillation: Secondary | ICD-10-CM | POA: Diagnosis not present

## 2020-01-09 DIAGNOSIS — G4733 Obstructive sleep apnea (adult) (pediatric): Secondary | ICD-10-CM | POA: Diagnosis not present

## 2020-01-09 DIAGNOSIS — Z5181 Encounter for therapeutic drug level monitoring: Secondary | ICD-10-CM | POA: Diagnosis not present

## 2020-01-09 DIAGNOSIS — Z7901 Long term (current) use of anticoagulants: Secondary | ICD-10-CM | POA: Diagnosis not present

## 2020-01-09 DIAGNOSIS — I428 Other cardiomyopathies: Secondary | ICD-10-CM | POA: Insufficient documentation

## 2020-01-09 DIAGNOSIS — Z79899 Other long term (current) drug therapy: Secondary | ICD-10-CM | POA: Insufficient documentation

## 2020-01-09 LAB — POCT INR: INR: 2.4 (ref 2.0–3.0)

## 2020-01-09 SURGERY — CARDIOVERSION
Anesthesia: General

## 2020-01-09 MED ORDER — PROPOFOL 10 MG/ML IV BOLUS
INTRAVENOUS | Status: DC | PRN
  Administered 2020-01-09: 70 mg via INTRAVENOUS

## 2020-01-09 MED ORDER — SODIUM CHLORIDE 0.9 % IV SOLN: INTRAVENOUS | Status: DC

## 2020-01-09 MED ORDER — LIDOCAINE 2% (20 MG/ML) 5 ML SYRINGE
INTRAMUSCULAR | Status: DC | PRN
  Administered 2020-01-09: 60 mg via INTRAVENOUS

## 2020-01-09 NOTE — Patient Instructions (Signed)
Continue taking 2 tablets daily. Recheck INR in 1 week. Call us with any concerns or changes. # [phone]

## 2020-01-09 NOTE — Anesthesia Preprocedure Evaluation (Signed)
Anesthesia Evaluation  Patient identified by MRN, date of birth, ID band Patient awake    Reviewed: Allergy & Precautions, H&P , NPO status , Patient's Chart, lab work & pertinent test results  Airway Mallampati: II  TM Distance: >3 FB Neck ROM: Full    Dental no notable dental hx. (+) Teeth Intact, Dental Advisory Given   Pulmonary sleep apnea ,    Pulmonary exam normal breath sounds clear to auscultation       Cardiovascular Exercise Tolerance: Good Normal cardiovascular exam+ dysrhythmias Atrial Fibrillation  Rhythm:Regular Rate:Normal  Echo 06/01/16 Left ventricle: The cavity size was moderately dilated. Wall   thickness was increased in a pattern of mild LVH. Systolic   function was normal. The estimated ejection fraction was in the   range of 55% to 60%. The study is not technically sufficient to   allow evaluation of LV diastolic function. - Mitral valve: There was mild regurgitation. - Left atrium: The atrium was moderately dilated.    Neuro/Psych negative neurological ROS  negative psych ROS   GI/Hepatic negative GI ROS, Neg liver ROS,   Endo/Other  negative endocrine ROS  Renal/GU negative Renal ROS  negative genitourinary   Musculoskeletal negative musculoskeletal ROS (+)   Abdominal   Peds negative pediatric ROS (+)  Hematology negative hematology ROS (+)   Anesthesia Other Findings   Reproductive/Obstetrics negative OB ROS                             Lab Results  Component Value Date   WBC 6.1 01/02/2020   HGB 14.6 01/02/2020   HCT 42.9 01/02/2020   MCV 93 01/02/2020   PLT 261 01/02/2020    Anesthesia Physical  Anesthesia Plan  ASA: III  Anesthesia Plan: General   Post-op Pain Management:  Regional for Post-op pain   Induction: Intravenous  PONV Risk Score and Plan: 3 and Treatment may vary due to age or medical condition, Ondansetron and  Dexamethasone  Airway Management Planned: Nasal Cannula and Natural Airway  Additional Equipment:   Intra-op Plan:   Post-operative Plan:   Informed Consent: I have reviewed the patients History and Physical, chart, labs and discussed the procedure including the risks, benefits and alternatives for the proposed anesthesia with the patient or authorized representative who has indicated his/her understanding and acceptance.     Dental advisory given  Plan Discussed with: CRNA  Anesthesia Plan Comments:         Anesthesia Quick Evaluation

## 2020-01-09 NOTE — Transfer of Care (Signed)
Immediate Anesthesia Transfer of Care Note  Patient: Ruben [name]  Procedure(s) Performed: CARDIOVERSION (N/A )  Patient Location: Endoscopy Unit  Anesthesia Type:General  Level of Consciousness: drowsy and patient cooperative  Airway & Oxygen Therapy: Patient Spontanous Breathing  Post-op Assessment: Report given to RN and Post -op Vital signs reviewed and stable  Post vital signs: Reviewed and stable  Last Vitals:  Vitals Value Taken Time  BP    Temp    Pulse    Resp    SpO2      Last Pain:  Vitals:   01/09/20 0949  TempSrc: Oral  PainSc: 0-No pain         Complications: No complications documented.

## 2020-01-09 NOTE — CV Procedure (Signed)
Electrical Cardioversion Procedure Note Ruben [name] [id number] [birth year]  Procedure: Electrical Cardioversion Indications:  Atrial Fibrillation  Procedure Details Consent: Risks of procedure as well as the alternatives and risks of each were explained to the (patient/caregiver).  Consent for procedure obtained. Time Out: Verified patient identification, verified procedure, site/side was marked, verified correct patient position, special equipment/implants available, medications/allergies/relevent history reviewed, required imaging and test results available.  Performed  Patient placed on cardiac monitor, pulse oximetry, supplemental oxygen as necessary.  Sedation given: propofol Pacer pads placed anterior and posterior chest.  Cardioverted 1 time(s).  Cardioverted at 200J.  Evaluation Findings: Post procedure EKG shows: NSR Complications: None Patient did tolerate procedure well.   Chilton Si, MD 01/09/2020, 10:36 AM

## 2020-01-09 NOTE — Interval H&P Note (Signed)
History and Physical Interval Note:  01/09/2020 10:17 AM  Ruben [name]  has presented today for surgery, with the diagnosis of AFIB.  The various methods of treatment have been discussed with the patient and family. After consideration of risks, benefits and other options for treatment, the patient has consented to  Procedure(s): CARDIOVERSION (N/A) as a surgical intervention.  The patient's history has been reviewed, patient examined, no change in status, stable for surgery.  I have reviewed the patient's chart and labs.  Questions were answered to the patient's satisfaction.     Chilton Si, MD

## 2020-01-09 NOTE — Anesthesia Postprocedure Evaluation (Signed)
Anesthesia Post Note  Patient: Ruben [name]  Procedure(s) Performed: CARDIOVERSION (N/A )     Patient location during evaluation: Endoscopy Anesthesia Type: General Level of consciousness: awake and alert Pain management: pain level controlled Vital Signs Assessment: post-procedure vital signs reviewed and stable Respiratory status: spontaneous breathing, nonlabored ventilation and respiratory function stable Cardiovascular status: blood pressure returned to baseline and stable Postop Assessment: no apparent nausea or vomiting Anesthetic complications: no   No complications documented.  Last Vitals:  Vitals:   01/09/20 1039 01/09/20 1049  BP: 111/79 109/75  Pulse: 70 (!) 52  Resp: 18 15  Temp: 37.3 C   SpO2: 98% 98%    Last Pain:  Vitals:   01/09/20 1049  TempSrc:   PainSc: 0-No pain                 Woody Kronberg,W. EDMOND

## 2020-01-10 ENCOUNTER — Encounter (HOSPITAL_COMMUNITY): Payer: Self-pay | Admitting: Cardiovascular Disease

## 2020-01-16 ENCOUNTER — Encounter (HOSPITAL_COMMUNITY): Payer: Self-pay | Admitting: Nurse Practitioner

## 2020-01-16 ENCOUNTER — Ambulatory Visit (HOSPITAL_COMMUNITY)
Admission: RE | Admit: 2020-01-16 | Discharge: 2020-01-16 | Disposition: A | Discharge status: Home / Self Care | Payer: BC Managed Care – PPO | Source: Ambulatory Visit | Attending: Nurse Practitioner | Admitting: Nurse Practitioner

## 2020-01-16 ENCOUNTER — Ambulatory Visit (INDEPENDENT_AMBULATORY_CARE_PROVIDER_SITE_OTHER): Payer: BC Managed Care – PPO

## 2020-01-16 ENCOUNTER — Other Ambulatory Visit: Payer: Self-pay

## 2020-01-16 VITALS — BP 122/70 | HR 53 | Ht 76.0 in | Wt 269.2 lb

## 2020-01-16 DIAGNOSIS — Z7901 Long term (current) use of anticoagulants: Secondary | ICD-10-CM | POA: Diagnosis not present

## 2020-01-16 DIAGNOSIS — I4891 Unspecified atrial fibrillation: Secondary | ICD-10-CM | POA: Insufficient documentation

## 2020-01-16 DIAGNOSIS — Z8249 Family history of ischemic heart disease and other diseases of the circulatory system: Secondary | ICD-10-CM | POA: Insufficient documentation

## 2020-01-16 DIAGNOSIS — Z5181 Encounter for therapeutic drug level monitoring: Secondary | ICD-10-CM

## 2020-01-16 DIAGNOSIS — D6869 Other thrombophilia: Secondary | ICD-10-CM

## 2020-01-16 DIAGNOSIS — I428 Other cardiomyopathies: Secondary | ICD-10-CM | POA: Diagnosis not present

## 2020-01-16 DIAGNOSIS — Z79899 Other long term (current) drug therapy: Secondary | ICD-10-CM | POA: Insufficient documentation

## 2020-01-16 DIAGNOSIS — I4819 Other persistent atrial fibrillation: Secondary | ICD-10-CM

## 2020-01-16 LAB — POCT INR: INR: 2.7 (ref 2.0–3.0)

## 2020-01-16 NOTE — Progress Notes (Signed)
Primary Care Physician: Verlon Au, MD Referring Physician: Dr. 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 male with a h/o NICM and afib. He was hospitalized in February 2012 and was critically ill with severe systolic left ventricular dysfunction and atrial fibrillation. He was successfully cardioverted back to sinus rhythm in May 2012. He did well until August 2014 when he went back into atrial fibrillation after  Amiodarone reduced to 100 mg daily. His dose was increased and he was cardioverted him successfully on 03/02/13. Since then he has maintained normal sinus rhythm. His echocardiogram in 01/14/13 showed that his ejection fraction had improved to 50-55%.  Due to concerns of long term complications of amiodarone, the dose of drug was decreased, but with subsequent return of atrial fibrillation. He  was in the afib clinic 05/22/16 to discuss options. He is fairly comfortable in afib, and is rate controlled at rest, but does notice that his heart rate will increase to 120 bpm with exertion.  He does not drink alcohol, no excessive caffeine, no tobacco. He is obese, does not exercise and admits that he eats a lot of junk food. He does have OSA and uses CPAP.  He eventually had amiodarone washout and was hospitalized in April for tikosyn.  F/u afib clinic,from tikosyn loading, 4/16-1/19.Marland Kitchen He did well with stable qtc, remains stable today at 446 ms. He is slow today at 49 bpm but not symptomatic.He feels better in SR with more energy.  Being seen in afib clinic 04/27/17. He has had persistent  Afib 12/19, notices more fatigue. He is interested in having cardioversion. No known triggers.  F/u in afib clinic 04/15/18. He felt that he went into afib last weekend. He did this back in late December 2018 /early January 2019  and went back into NSR while awaiting to get cardioverted. He has not missed any tikosyn. He is rate controlled. 114 bpm on EKG but with quiet sitting,  his HR's were in the 60/70's. He will need weekly INR's.  F/u 05/19/2018,he had successful cardioversion after 4 in range INR's and remains in sinus brady at 45 bpm. He feels well. His BB had been increased for  afib rate control while out of rhythm. He will go back to 6.25 mg bid. He feels improved.  F/u in afib clinic, 8/24. He  asked to be seen as he went into afib last night. He felt dizzy, vision blurred and slight  nausea for a few minutes, this passed and then he realized he was in afib. He went to [facility] and since he was stable, rate controlled, he was d/c home to f/u with cardiology. He is rate controlled today. He continues on Tikosyn. His BB was stopped a few months ago for bradycardia. He continues on warfarin with a CHA2DS2VASc score of at least 2. He appears stable.  F/u in afib clinic, 8/31. He remains in afib despite restarting BB. We discussed cardioversion with the 4 weekly INR's vrs TEE guided DCCV. He says that he feels OK so he is willing to wait for the cardioversion.   F/u Afib clinic, 9/28, one week s/p successful  Cardioversion. He remains in Sinus brady. Coreg has been stopped with him back in rhythm. He feels improved.   Today, he denies symptoms of  chest pain, orthopnea, PND, lower extremity edema, dizziness, presyncope, syncope, or neurologic sequela.   The patient is tolerating medications without difficulties and is otherwise without complaint  today.   Past Medical History:  Diagnosis Date   Atrial fibrillation (HCC)    Cough    Non-ischemic cardiomyopathy (HCC)        OSA (obstructive sleep apnea)    Past Surgical History:  Procedure Laterality Date   CARDIOVERSION N/A 03/02/2013   Procedure: CARDIOVERSION;  Surgeon: [name], MD;  Location: [facility] ENDOSCOPY;  Service: Cardiovascular;  Laterality: N/A;   CARDIOVERSION N/A 08/05/2016   Procedure: CARDIOVERSION;  Surgeon: Chilton Si, MD;  Location: [facility] ENDOSCOPY;  Service:  Cardiovascular;  Laterality: N/A;   CARDIOVERSION N/A 05/09/2018   Procedure: CARDIOVERSION;  Surgeon: Jodelle Red, MD;  Location: [facility] ENDOSCOPY;  Service: Cardiovascular;  Laterality: N/A;   CARDIOVERSION N/A 01/09/2020   Procedure: CARDIOVERSION;  Surgeon: Chilton Si, MD;  Location: [facility] ENDOSCOPY;  Service: Cardiovascular;  Laterality: N/A;   FOOT FRACTURE SURGERY     PILONIDAL CYST EXCISION      Current Outpatient Medications  Medication Sig Dispense Refill   dofetilide (TIKOSYN) 500 MCG capsule Take 1 capsule (500 mcg total) by mouth 2 (two) times daily. 180 capsule 2   guaiFENesin (MUCINEX) 600 MG 12 hr tablet Take 600 mg by mouth 2 (two) times daily as needed for cough or to loosen phlegm.     hydrALAZINE (APRESOLINE) 25 MG tablet TAKE 1/2 (ONE-HALF) TABLET BY MOUTH THREE TIMES DAILY (Patient taking differently: 12.5 mg 3 (three) times daily. ) 135 tablet 3   lisinopril (ZESTRIL) 5 MG tablet Take 1 tablet by mouth once daily (Patient taking differently: Take 5 mg by mouth daily. ) 90 tablet 2   magnesium oxide (MAG-OX) 400 (241.3 Mg) MG tablet Take 1 tablet by mouth twice daily (Patient taking differently: Take 400 mg by mouth 2 (two) times daily. ) 60 tablet 11   pravastatin (PRAVACHOL) 20 MG tablet Take 20 mg by mouth daily.     warfarin (COUMADIN) 2 MG tablet TAKE AS DIRECTED BY COUMDAIN CLINIC 65 tablet 2   acetaminophen (TYLENOL) 500 MG tablet Take 1,000 mg by mouth every 8 (eight) hours as needed for mild pain or headache.     diclofenac Sodium (VOLTAREN) 1 % GEL Apply 2 g topically every other day.     neomycin-bacitracin-polymyxin (NEOSPORIN) ointment Apply 1 application topically daily as needed for wound care.     PREVIDENT 5000 BOOSTER PLUS 1.1 % PSTE Use as directed 1 application in the mouth or throat daily.      No current facility-administered medications for this encounter.    No Known Allergies  Social History   Socioeconomic History     Marital status: Married    Spouse name: Not on file   Number of children: Not on file   Years of education: Not on file   Highest education level: Not on file  Occupational History   Not on file  Tobacco Use   Smoking status: Never Smoker   Smokeless tobacco: Never Used  Vaping Use   Vaping Use: Never used  Substance and Sexual Activity   Alcohol use: No   Drug use: No   Sexual activity: Yes    Birth control/protection: Other-see comments  Other Topics Concern   Not on file  Social History Narrative   Not on file   Social Determinants of Health   Financial Resource Strain:    Difficulty of Paying Living Expenses: Not on file  Food Insecurity:    Worried About Running Out of Food in the Last Year: Not on file  Ran Out of Food in the Last Year: Not on file  Transportation Needs:    Lack of Transportation (Medical): Not on file   Lack of Transportation (Non-Medical): Not on file  Physical Activity:    Days of Exercise per Week: Not on file   Minutes of Exercise per Session: Not on file  Stress:    Feeling of Stress : Not on file  Social Connections:    Frequency of Communication with Friends and Family: Not on file   Frequency of Social Gatherings with Friends and Family: Not on file   Attends Religious Services: Not on file   Active Member of Clubs or Organizations: Not on file   Attends Banker Meetings: Not on file   Marital Status: Not on file  Intimate Partner Violence:    Fear of Current or Ex-Partner: Not on file   Emotionally Abused: Not on file   Physically Abused: Not on file   Sexually Abused: Not on file    Family History  Problem Relation Age of Onset   Cancer Mother    Stroke Mother    Hypertension Mother    Memory loss Mother    Prostate cancer Father     ROS- All systems are reviewed and negative except as per the HPI above  Physical Exam: Vitals:   01/16/20 0923  BP: 122/70  Pulse: (!)  53  Weight: 122.1 kg  Height: 6\' 4"  (1.93 m)   Wt Readings from Last 3 Encounters:  01/16/20 122.1 kg  01/09/20 120.2 kg  12/19/19 121.7 kg    Labs: Lab Results  Component Value Date   NA 140 01/02/2020   K 4.4 01/02/2020   CL 104 01/02/2020   CO2 24 01/02/2020   GLUCOSE 99 01/02/2020   BUN 15 01/02/2020   CREATININE 1.07 01/02/2020   CALCIUM 9.0 01/02/2020   PHOS 3.9 05/09/2010   MG 1.9 12/11/2019   Lab Results  Component Value Date   INR 2.4 01/09/2020   Lab Results  Component Value Date   CHOL 168 07/31/2019   HDL 37 (L) 07/31/2019   LDLCALC 96 07/31/2019   TRIG 203 (H) 07/31/2019     GEN- The patient is well appearing, alert and oriented x 3 today.   Head- normocephalic, atraumatic Eyes-  Sclera clear, conjunctiva pink Ears- hearing intact Oropharynx- clear Neck- supple, no JVP Lymph- no cervical lymphadenopathy Lungs- Clear to ausculation bilaterally, normal work of breathing Heart-  irregular rate and rhythm, no murmurs, rubs or gallops, PMI not laterally displaced GI- soft, NT, ND, + BS Extremities- no clubbing, cyanosis, or edema MS- no significant deformity or atrophy Skin- no rash or lesion Psych- euthymic mood, full affect Neuro- strength and sensation are intact  EKG- sinus brady at 53 bpm, PR int 194 ms, qrs int 94 ms, qtc 463 ms Echo- 2018- Study Conclusions-Study Conclusions   - Left ventricle: The cavity size was moderately dilated. Wall  thickness was increased in a pattern of mild LVH. Systolic  function was normal. The estimated ejection fraction was in the  range of 55% to 60%. The study is not technically sufficient to  allow evaluation of LV diastolic function.  - Mitral valve: There was mild regurgitation.  - Left atrium: The atrium was moderately dilated.    Assessment and Plan: 1. Afib On tikosyn since April 2018, with return of afib 6-8 weeks ago After 4 therapeutic  INR's he underwent successful  DCCV 9/21 No  known trigger  Remains in Sinus brady  Back on lisinopril for now and off BB as he has bradycardia and soft BP's on BB in sinus rhythm  Continue  tikosyn 500 mcg bid  Has had both covid vaccines   2. H/o NICM Echo updated 06/01/16- EF 55-60%  3. CHA2DS2VASc score of at least 2 Coumadin clinic to be notified to get INR's weekly for the next 2 weeks We discussed change to DOAC, he prefers to stay on warfarin    f/u with Dr. Melburn Popper 10/25  Lupita Leash [facility] [address] [place], [place] [address] [phone]

## 2020-01-16 NOTE — Patient Instructions (Signed)
Description   Continue on same dosage 2 tablets daily. Recheck INR in 4 weeks.  Call us with any concerns or changes. # [id number] [id number]

## 2020-01-20 ENCOUNTER — Other Ambulatory Visit (HOSPITAL_COMMUNITY): Payer: Self-pay | Admitting: Nurse Practitioner

## 2020-01-27 ENCOUNTER — Other Ambulatory Visit: Payer: Self-pay | Admitting: Cardiovascular Disease

## 2020-02-08 ENCOUNTER — Other Ambulatory Visit: Payer: Self-pay

## 2020-02-08 ENCOUNTER — Other Ambulatory Visit: Payer: BC Managed Care – PPO | Admitting: *Deleted

## 2020-02-08 DIAGNOSIS — E782 Mixed hyperlipidemia: Secondary | ICD-10-CM

## 2020-02-08 DIAGNOSIS — I5022 Chronic systolic (congestive) heart failure: Secondary | ICD-10-CM

## 2020-02-08 DIAGNOSIS — E781 Pure hyperglyceridemia: Secondary | ICD-10-CM

## 2020-02-08 DIAGNOSIS — I4819 Other persistent atrial fibrillation: Secondary | ICD-10-CM

## 2020-02-08 DIAGNOSIS — Z79899 Other long term (current) drug therapy: Secondary | ICD-10-CM

## 2020-02-08 LAB — BASIC METABOLIC PANEL
BUN/Creatinine Ratio: 12 (ref 10–24)
BUN: 13 mg/dL (ref 8–27)
CO2: 23 mmol/L (ref 20–29)
Calcium: 9 mg/dL (ref 8.6–10.2)
Chloride: 103 mmol/L (ref 96–106)
Creatinine, Ser: 1.1 mg/dL (ref 0.76–1.27)
GFR calc Af Amer: 79 mL/min/{1.73_m2} (ref >59)
GFR calc non Af Amer: 69 mL/min/{1.73_m2} (ref >59)
Glucose: 105 mg/dL — ABNORMAL HIGH (ref 65–99)
Potassium: 4.1 mmol/L (ref 3.5–5.2)
Sodium: 138 mmol/L (ref 134–144)

## 2020-02-08 LAB — MAGNESIUM: Magnesium: 2 mg/dL (ref 1.6–2.3)

## 2020-02-11 ENCOUNTER — Encounter: Payer: Self-pay | Admitting: Cardiovascular Disease

## 2020-02-11 NOTE — Progress Notes (Signed)
Cardiology Office Note   Date:  02/12/2020   ID:  BRANDYN MCCARTHER, DOB 1950/08/25, MRN [id number]  PCP:  Verlon Au, MD  Cardiologist:  previous [name] MD,  [name], MD   Chief Complaint  Patient presents with  . Congestive Heart Failure  . Hyperlipidemia   Problem list 1. 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  Atrial fibrillation 3. Obesity 4. Hyperlipidemia 5. Obstructive sleep apnea 6. Mitral valve prolapse - mild MR by echo Sept. 2014    [name] - notes from Jens Renard is a 69 y.o. male who presents for Six-month follow-up visit    He has a past history of nonischemic dilated cardiomyopathy and associated atrial fibrillation at that time. He was hospitalized in February 2012 and was critically ill with severe systolic left ventricular dysfunction and atrial fibrillation. He was successfully cardioverted back to sinus rhythm in May 2012. He did well until August 2014 when he went back into atrial fibrillation after we had cut back his amiodarone to just 100 mg daily. His dose was increased and we cardioverted him successfully on 03/02/13. Since then he has maintained normal sinus rhythm. His echocardiogram in 01/14/13 showed that his ejection fraction had improved to 50-55%. Since last visit has had no new cardiac symptoms. He has a history of sleep apnea and uses a CPAP machine successfully.  The patient has a history of hypercholesterolemia. He is not experiencing any myalgias. Recent lab work includes normal hepatic function and normal thyroid function  He has had a slight cough which is resolving on Mucinex.He has gained weight.  His family gave him a fit.bit  And he is trying to get more exercise to help him lose weight.  November 07, 2015:  Hx of a viral cardiomyopathy in 2011. Never got over a cold - progressive shortness of breath.  EF was 10% at one point  EF h [name]  is doing well.  Seen for the first time today - transfer from [place]  Works for the state - [employer] .   No regular exercise .    No Cp ,  Breathing is good.  Jan. 16, 2018  Doing well. Has had a cold recently. Has not been exercising .   Jan.  25th, 2018:  [name] is seen back today for a work in visit.  During his last visit we decreased his dose of amiodarone. He has gone back into atrial fibrillation.  He has been active,   This past Sunday , he noticed that he was more short of breath,  More fatigued.   Was seen in the hospital - found to be in atrial fib  He notices a faster HR on his Fit Bit.   Has not missed any doses of anticoagulant .    Has not tried any other antiarrhythmics.   12/23/2016: Doing well Breathing is going well.   Has fatigue in the evening  Had Afib earlier this year, Started on Tikosyn 500 BID  Labs last week  Potassium = 4.4 No mag level was drawn  Echo from Feb. 2018 shows normal LV EF   Aug 18, 2017:   [name] is seen today for follow-up of his atrial fibrillation and chronic systolic congestive heart failure. His A. fib has been well controlled on Tikosyn. Had an episode of AF after shoveling snow.  Lasted for a day or so   February 16, 2018: Is here  to follow-up with his atrial fibrillation and chronic systolic congestive heart failure.  His atrial fibrillation is well controlled on Tikosyn.  His magnesium and potassium levels were drawn several days ago and are normal.  Had a brief episode of PAF - lasted for a month and then went back into rhythm.  Has had a cough - has been taking mucinex  He thought he had some fluid retention. EF was normal .     August 01, 2018:   [name] presents today for follow-up of his chronic systolic congestive heart failure and atrial fibrillation.    The  fraction was as low as 10% percent previously.   Recent echocardiogram from February, 2018 reveals an ejection fraction of 55 to 60%. He  has a history of atrial fibrillation.  He is controlled on amiodarone but due to the long-term side effects of amiodarone he was changed to Tikosyn in April, 2019.   He was last seen by [name] in the atrial fibrillation clinic in January, 2020.  He has had some recurrent episodes of atrial fibrillation.  He was successfully cardioverted on May 19, 2018.  He is on chronic Coumadin therapy.  Has not had any further episodes of AF since that time . No CP , no dyspnea,  No fever, cough.   He scheduled for follow-up to see today for an EKG, basic metabolic panel and magnesium levels.  Oct. 13, 2020   [name] is see for follow up of his atrial fib and chronic systolic CHF No cp, no  Dyspnea, no presyncope  Gets some exercise  Needs to exercise more.    August 01, 2019: [name] is seen today for follow-up of his chronic systolic and diastolic congestive heart failure, hyperlipidemia, atrial fibrillation.  He remains on Tikosyn.  His last echocardiogram revealed an ejection fraction of 55 to 60%.  We were unable to evaluate diastolic function.  He has mild mitral regurgitation.  The left atrium is moderately dilated.  HR is slow. Has not had any syncope  Breathing is good.  Avoids salt,   May eat some processed meats   Oct. 25, 2021:  [name] is seen today for follow up of his  Chronic combined CHF  and AFib. He has mild MR. LA is moderately diated.  Has had some arrhythmias.  Became dizzy while driving.  Slightly light headed.  Went to [facility].  Replaced his mag.  Went to Afib clinic  Had cardioversion a month later.  Is on Tikosyn,  Is maintaining NSR  Potassium and magnesium levels were drawn recently and are normal.    Past Medical History:  Diagnosis Date  . Atrial fibrillation (HCC)   . Cough   . Non-ischemic cardiomyopathy (HCC)       . OSA (obstructive sleep apnea)     Past Surgical History:  Procedure Laterality Date  . CARDIOVERSION N/A 03/02/2013   Procedure:  CARDIOVERSION;  Surgeon: [name], MD;  Location: [facility] ENDOSCOPY;  Service: Cardiovascular;  Laterality: N/A;  . CARDIOVERSION N/A 08/05/2016   Procedure: CARDIOVERSION;  Surgeon: Chilton Si, MD;  Location: [facility] ENDOSCOPY;  Service: Cardiovascular;  Laterality: N/A;  . CARDIOVERSION N/A 05/09/2018   Procedure: CARDIOVERSION;  Surgeon: Jodelle Red, MD;  Location: [facility] ENDOSCOPY;  Service: Cardiovascular;  Laterality: N/A;  . CARDIOVERSION N/A 01/09/2020   Procedure: CARDIOVERSION;  Surgeon: Chilton Si, MD;  Location: [facility] ENDOSCOPY;  Service: Cardiovascular;  Laterality: N/A;  . FOOT FRACTURE SURGERY    . PILONIDAL CYST EXCISION  Current Outpatient Medications  Medication Sig Dispense Refill  . acetaminophen (TYLENOL) 500 MG tablet Take 1,000 mg by mouth every 8 (eight) hours as needed for mild pain or headache.    . diclofenac Sodium (VOLTAREN) 1 % GEL Apply 2 g topically every other day.    . dofetilide (TIKOSYN) 500 MCG capsule Take 1 capsule (500 mcg total) by mouth 2 (two) times daily. 180 capsule 2  . guaiFENesin (MUCINEX) 600 MG 12 hr tablet Take 600 mg by mouth 2 (two) times daily as needed for cough or to loosen phlegm.    . hydrALAZINE (APRESOLINE) 25 MG tablet TAKE 1/2 (ONE-HALF) TABLET BY MOUTH THREE TIMES DAILY 135 tablet 2  . lisinopril (ZESTRIL) 5 MG tablet Take 1 tablet by mouth once daily 90 tablet 2  . magnesium oxide (MAG-OX) 400 (241.3 Mg) MG tablet Take 1 tablet by mouth twice daily 60 tablet 0  . neomycin-bacitracin-polymyxin (NEOSPORIN) ointment Apply 1 application topically daily as needed for wound care.    . pravastatin (PRAVACHOL) 20 MG tablet Take 20 mg by mouth daily.    Marland Kitchen PREVIDENT 5000 BOOSTER PLUS 1.1 % PSTE Use as directed 1 application in the mouth or throat daily.     Marland Kitchen warfarin (COUMADIN) 2 MG tablet TAKE AS DIRECTED BY COUMDAIN CLINIC 65 tablet 2   No current facility-administered medications for this visit.    Allergies:    Patient has no known allergies.    Social History:  The patient  reports that he has never smoked. He has never used smokeless tobacco. He reports that he does not drink alcohol and does not use drugs.   Family History:  The patient's family history includes Cancer in his mother; Hypertension in his mother; Memory loss in his mother; Prostate cancer in his father; Stroke in his mother.    ROS:  Please see the history of present illness.   Otherwise, review of systems are positive for none.   All other systems are reviewed and negative.    Physical Exam: Blood pressure 104/62, pulse (!) 55, height 6\' 4"  (1.93 m), weight 264 lb 3.2 oz (119.8 kg), SpO2 96 %.  GEN:  Well nourished, well developed in no acute distress HEENT: Normal NECK: No JVD; No carotid bruits LYMPHATICS: No lymphadenopathy CARDIAC: RRR , no murmurs, rubs, gallops RESPIRATORY:  Clear to auscultation without rales, wheezing or rhonchi  ABDOMEN: Soft, non-tender, non-distended MUSCULOSKELETAL:  No edema; No deformity  SKIN: Warm and dry NEUROLOGIC:  Alert and oriented x 3   EKG:     February 12, 2020: Sinus bradycardia 55 beats minute.  Occasional premature ventricular contraction.  No changes from previous EKG.   Recent Labs: 07/31/2019: ALT 14 01/02/2020: Hemoglobin 14.6; Platelets 261 02/08/2020: BUN 13; Creatinine, Ser 1.10; Magnesium 2.0; Potassium 4.1; Sodium 138    Lipid Panel    Component Value Date/Time   CHOL 168 07/31/2019 0736   TRIG 203 (H) 07/31/2019 0736   HDL 37 (L) 07/31/2019 0736   CHOLHDL 4.5 07/31/2019 0736   CHOLHDL 3.4 11/05/2015 0746   VLDL 31 (H) 11/05/2015 0746   LDLCALC 96 07/31/2019 0736   LDLDIRECT 90.2 12/30/2012 0740      Wt Readings from Last 3 Encounters:  02/12/20 264 lb 3.2 oz (119.8 kg)  01/16/20 269 lb 3.2 oz (122.1 kg)  01/09/20 265 lb (120.2 kg)      ASSESSMENT AND PLAN:  1.  Chronic combined S/D  CHF :  Seems to be stable  2. Persistant  atrial  fibrillation, he is maintaining sinus rhythm.  Is on Tikosyn.  Had 1 episode of AF and was cardioverted.   Was seen in the Afib clinic Is on coumadin   3. Dyslipidemia.  Cont. Meds.  Will check lipids, liver enz at that time.    4.    Hypertension:  BP is well controlled.    [name], MD  02/12/2020 8:30 AM    [facility] [address] [place],  [address] [place], [place]  [address] Pager [phone] Phone: [phone]; Fax: [phone]

## 2020-02-12 ENCOUNTER — Other Ambulatory Visit: Payer: Self-pay

## 2020-02-12 ENCOUNTER — Ambulatory Visit (INDEPENDENT_AMBULATORY_CARE_PROVIDER_SITE_OTHER): Payer: BC Managed Care – PPO | Admitting: Cardiovascular Disease

## 2020-02-12 ENCOUNTER — Ambulatory Visit (INDEPENDENT_AMBULATORY_CARE_PROVIDER_SITE_OTHER): Payer: BC Managed Care – PPO | Admitting: Pharmacist

## 2020-02-12 ENCOUNTER — Encounter: Payer: Self-pay | Admitting: Cardiovascular Disease

## 2020-02-12 VITALS — BP 104/62 | HR 55 | Ht 76.0 in | Wt 264.2 lb

## 2020-02-12 DIAGNOSIS — Z5181 Encounter for therapeutic drug level monitoring: Secondary | ICD-10-CM

## 2020-02-12 DIAGNOSIS — I4891 Unspecified atrial fibrillation: Secondary | ICD-10-CM

## 2020-02-12 LAB — POCT INR: INR: 2.1 (ref 2.0–3.0)

## 2020-02-12 NOTE — Patient Instructions (Signed)
Medication Instructions:  NO CHANGES *If you need a refill on your cardiac medications before your next appointment, please call your pharmacy*   Lab Work:  6 MONTHS MAG BMET LIPID AND LIVER If you have labs (blood work) drawn today and your tests are completely normal, you will receive your results only by: Marland Kitchen MyChart Message (if you have MyChart) OR . A paper copy in the mail If you have any lab test that is abnormal or we need to change your treatment, we will call you to review the results.   Testing/Procedures: 6 MONTHS EKG    Follow-Up: At [facility], you and your health needs are our priority.  As part of our continuing mission to provide you with exceptional heart care, we have created designated Provider Care Teams.  These Care Teams include your primary Cardiologist (physician) and Advanced Practice Providers (APPs -  Physician Assistants and Nurse Practitioners) who all work together to provide you with the care you need, when you need it.  We recommend signing up for the patient portal called "MyChart".  Sign up information is provided on this After Visit Summary.  MyChart is used to connect with patients for Virtual Visits (Telemedicine).  Patients are able to view lab/test results, encounter notes, upcoming appointments, etc.  Non-urgent messages can be sent to your provider as well.   To learn more about what you can do with MyChart, go to [email or web address].    Your next appointment:   6 month(s)  The format for your next appointment:   In Person  Provider:   You will see one of the following Advanced Practice Providers on your designated Care Team:    Tereso Newcomer, PA-C  Chelsea Aus, [place]     Other Instructions [phone]

## 2020-02-12 NOTE — Patient Instructions (Signed)
Continue on same dosage 2 tablets daily. Recheck INR in 10 weeks.  Call us with any concerns or changes. # [phone]  

## 2020-02-17 ENCOUNTER — Other Ambulatory Visit (HOSPITAL_COMMUNITY): Payer: Self-pay | Admitting: Nurse Practitioner

## 2020-04-22 ENCOUNTER — Other Ambulatory Visit: Payer: Self-pay

## 2020-04-22 ENCOUNTER — Ambulatory Visit (INDEPENDENT_AMBULATORY_CARE_PROVIDER_SITE_OTHER): Payer: BC Managed Care – PPO

## 2020-04-22 DIAGNOSIS — Z5181 Encounter for therapeutic drug level monitoring: Secondary | ICD-10-CM

## 2020-04-22 LAB — POCT INR: INR: 2.2 (ref 2.0–3.0)

## 2020-04-22 NOTE — Patient Instructions (Signed)
Continue on same dosage 2 tablets daily. Recheck INR in 10 weeks.  Call us with any concerns or changes. # [phone]  

## 2020-05-04 ENCOUNTER — Other Ambulatory Visit: Payer: Self-pay | Admitting: Cardiovascular Disease

## 2020-05-08 ENCOUNTER — Telehealth: Payer: Self-pay | Admitting: Cardiovascular Disease

## 2020-05-08 NOTE — Telephone Encounter (Signed)
Left patient a message regarding the following recommendation from CP,PharmD.   Recommend cetirizine (Zyrtec) 10mg   as it causes less drowsiness than Benadryl.

## 2020-05-08 NOTE — Telephone Encounter (Signed)
Patient states he has bad allergies this winter and wants to know if he can take benadryl or anything else besides musinex.

## 2020-05-08 NOTE — Telephone Encounter (Signed)
Recommend cetirizine (Zyrtec) 10mg   as it causes less drowsiness than Benadryl.

## 2020-05-13 ENCOUNTER — Telehealth: Payer: Self-pay | Admitting: Cardiovascular Disease

## 2020-05-13 NOTE — Telephone Encounter (Signed)
He should continue his normal twice daily dosing schedule.  He should not double up doses

## 2020-05-13 NOTE — Telephone Encounter (Signed)
  Pt c/o medication issue:  1. Name of Medication: dofetilide (TIKOSYN) 500 MCG capsule  2. How are you currently taking this medication (dosage and times per day)? Take 1 capsule (500 mcg total) by mouth 2 (two) times daily.  3. Are you having a reaction (difficulty breathing--STAT)?  No  4. What is your medication issue? Patient missed a dose yesterday and wants to make sure that is okay. He forgot his nighttime dose. He states he is not having any issues but just wanted reassurance.

## 2020-05-13 NOTE — Telephone Encounter (Signed)
Pt advised and will continue his normal dosing and will call if he develops any problems or symptoms.

## 2020-06-04 ENCOUNTER — Telehealth: Payer: Self-pay | Admitting: Cardiovascular Disease

## 2020-06-04 DIAGNOSIS — I5022 Chronic systolic (congestive) heart failure: Secondary | ICD-10-CM

## 2020-06-04 DIAGNOSIS — Z7901 Long term (current) use of anticoagulants: Secondary | ICD-10-CM

## 2020-06-04 NOTE — Telephone Encounter (Signed)
    Pt said he needs a lab work prior his appt with Tereso Newcomer. No order on file

## 2020-06-04 NOTE — Telephone Encounter (Signed)
What labs do you want pt to have prior to seeing you ?

## 2020-06-05 NOTE — Telephone Encounter (Signed)
CMET, Magnesium, CBC, fasting Lipids. Dx: CHF, AFib, high risk medication, chronic anticoagulation, hyperlipidemia. Tereso Newcomer, PA-C    06/05/2020 8:46 AM

## 2020-06-05 NOTE — Telephone Encounter (Signed)
Called and left a detailed message letting patient know that Fasting Labs have been ordered and that he will just need to call back and schedule lab appointment prior to OV.

## 2020-06-08 ENCOUNTER — Other Ambulatory Visit: Payer: Self-pay | Admitting: Cardiovascular Disease

## 2020-07-01 ENCOUNTER — Other Ambulatory Visit: Payer: Self-pay

## 2020-07-01 ENCOUNTER — Ambulatory Visit (INDEPENDENT_AMBULATORY_CARE_PROVIDER_SITE_OTHER): Payer: BC Managed Care – PPO

## 2020-07-01 DIAGNOSIS — Z5181 Encounter for therapeutic drug level monitoring: Secondary | ICD-10-CM

## 2020-07-01 LAB — POCT INR: INR: 2.4 (ref 2.0–3.0)

## 2020-07-01 NOTE — Patient Instructions (Signed)
Continue on same dosage 2 tablets daily. Recheck INR in 10 weeks.  Call us with any concerns or changes. # [phone]

## 2020-08-07 ENCOUNTER — Other Ambulatory Visit: Payer: Medicare Other | Admitting: *Deleted

## 2020-08-07 ENCOUNTER — Other Ambulatory Visit: Payer: Self-pay

## 2020-08-07 DIAGNOSIS — I5022 Chronic systolic (congestive) heart failure: Secondary | ICD-10-CM

## 2020-08-07 DIAGNOSIS — Z7901 Long term (current) use of anticoagulants: Secondary | ICD-10-CM

## 2020-08-07 LAB — COMPREHENSIVE METABOLIC PANEL
ALT: 12 IU/L (ref 0–44)
AST: 12 IU/L (ref 0–40)
Albumin/Globulin Ratio: 1.7 (ref 1.2–2.2)
Albumin: 4.1 g/dL (ref 3.8–4.8)
Alkaline Phosphatase: 97 IU/L (ref 44–121)
BUN/Creatinine Ratio: 8 — ABNORMAL LOW (ref 10–24)
BUN: 9 mg/dL (ref 8–27)
Bilirubin Total: 0.4 mg/dL (ref 0.0–1.2)
CO2: 24 mmol/L (ref 20–29)
Calcium: 8.9 mg/dL (ref 8.6–10.2)
Chloride: 103 mmol/L (ref 96–106)
Creatinine, Ser: 1.14 mg/dL (ref 0.76–1.27)
Globulin, Total: 2.4 g/dL (ref 1.5–4.5)
Glucose: 103 mg/dL — ABNORMAL HIGH (ref 65–99)
Potassium: 4.4 mmol/L (ref 3.5–5.2)
Sodium: 141 mmol/L (ref 134–144)
Total Protein: 6.5 g/dL (ref 6.0–8.5)
eGFR: 70 mL/min/{1.73_m2} (ref >59)

## 2020-08-07 LAB — CBC
Hematocrit: 40.8 % (ref 37.5–51.0)
Hemoglobin: 13.7 g/dL (ref 13.0–17.7)
MCH: 31.3 pg (ref 26.6–33.0)
MCHC: 33.6 g/dL (ref 31.5–35.7)
MCV: 93 fL (ref 79–97)
Platelets: 295 10*3/uL (ref 150–450)
RBC: 4.38 x10E6/uL (ref 4.14–5.80)
RDW: 12.2 % (ref 11.6–15.4)
WBC: 5.6 10*3/uL (ref 3.4–10.8)

## 2020-08-07 LAB — LIPID PANEL
Chol/HDL Ratio: 4.3 ratio (ref 0.0–5.0)
Cholesterol, Total: 176 mg/dL (ref 100–199)
HDL: 41 mg/dL (ref >39)
LDL Chol Calc (NIH): 102 mg/dL — ABNORMAL HIGH (ref 0–99)
Triglycerides: 193 mg/dL — ABNORMAL HIGH (ref 0–149)
VLDL Cholesterol Cal: 33 mg/dL (ref 5–40)

## 2020-08-07 LAB — MAGNESIUM: Magnesium: 2 mg/dL (ref 1.6–2.3)

## 2020-08-10 ENCOUNTER — Other Ambulatory Visit: Payer: Self-pay | Admitting: Cardiovascular Disease

## 2020-08-11 NOTE — Progress Notes (Signed)
Cardiology Office Note:    Date:  08/12/2020   ID:  Ruben [name], DOB 1951-03-10, MRN [id number]  PCP:  Ruben Au, MD   [facility]  Cardiologist:  Ruben Miss, MD   Advanced Practice Provider:  Beatrice Lecher, PA-C Electrophysiologist:  None       Referring MD: Ruben Au, MD   Chief Complaint:  Follow-up (AFib, CHF)    Patient Profile:     Ruben [name] is a 70 y.o. 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 to Dofetilide   MVP  OSA on CPAP  Hyperlipidemia   Prior CV studies: Echocardiogram 06/01/16 Mild LVH, EF 55-60, mild MR, mod LAE     History of Present Illness:    Mr. Ruben [name] was last seen in the AFib clinic in 12/2019.  He was last seen by Dr. Elease [name] in 10/21.  He returns for f/u.  He is here alone.  He is doing well without chest pain, shortness of breath, syncope, orthopnea.  He has not had any further symptoms of AFib since his last DCCV.     Past Medical History:  Diagnosis Date  . Atrial fibrillation (HCC)   . Cough   . Non-ischemic cardiomyopathy (HCC)       . OSA (obstructive sleep apnea)     Current Medications: Current Meds  Medication Sig  . acetaminophen (TYLENOL) 500 MG tablet Take 1,000 mg by mouth every 8 (eight) hours as needed for mild pain or headache.  . diclofenac Sodium (VOLTAREN) 1 % GEL Apply 2 g topically every other day.  . dofetilide (TIKOSYN) 500 MCG capsule TAKE ONE CAPSULE BY MOUTH TWICE A DAY  . guaiFENesin (MUCINEX) 600 MG 12 hr tablet Take 600 mg by mouth 2 (two) times daily as needed for cough or to loosen phlegm.  . hydrALAZINE (APRESOLINE) 25 MG tablet TAKE 1/2  (ONE-HALF) TABLET BY MOUTH THREE TIMES DAILY  . lisinopril (ZESTRIL) 5 MG tablet Take 1 tablet by mouth once daily  . magnesium oxide (MAG-OX) 400 (241.3 Mg) MG tablet Take 1 tablet by mouth twice daily  . neomycin-bacitracin-polymyxin (NEOSPORIN) ointment Apply 1 application topically daily as needed for wound care.  . pravastatin (PRAVACHOL) 20 MG tablet Take 20 mg by mouth daily.  Marland Kitchen PREVIDENT 5000 BOOSTER PLUS 1.1 % PSTE Use as directed 1 application in the mouth or throat daily.   . [DISCONTINUED] warfarin (COUMADIN) 2 MG tablet Take 1 to 2 tablets every day or as directed by Coumadin clinic     Allergies:   Patient has no known allergies.   Social History   Tobacco Use  . Smoking status: Never Smoker  . Smokeless tobacco: Never Used  Vaping Use  . Vaping Use: Never used  Substance Use Topics  . Alcohol use: No  . Drug use: No     Family Hx: The patient's family history includes Cancer in his mother; Hypertension in his mother; Memory loss in his mother; Prostate cancer in his father; Stroke in his mother.  Review of Systems  Gastrointestinal: Negative for hematochezia and melena.  Genitourinary: Negative for hematuria.     EKGs/Labs/Other Test Reviewed:    EKG:  EKG is   ordered today.  The ekg ordered today demonstrates sinus brady, HR 59, low voltage, PVC, QTc 479, non-specific ST-TW changes, similar to old EKG  Recent Labs: 08/07/2020: ALT 12; BUN 9; Creatinine, Ser 1.14; Hemoglobin 13.7; Magnesium 2.0; Platelets 295; Potassium 4.4; Sodium 141   Recent Lipid Panel Lab Results  Component Value Date/Time   CHOL 176 08/07/2020 07:40 AM   TRIG 193 (H) 08/07/2020 07:40 AM   HDL 41 08/07/2020 07:40 AM   CHOLHDL 4.3 08/07/2020 07:40 AM   CHOLHDL 3.4 11/05/2015 07:46 AM   LDLCALC 102 (H) 08/07/2020 07:40 AM   LDLDIRECT 90.2 12/30/2012 07:40 AM      Risk Assessment/Calculations:    CHA2DS2-VASc Score = 2  This indicates a 2.2% annual risk of stroke. The patient's  score is based upon: CHF History: Yes HTN History: No Diabetes History: No Stroke History: No Vascular Disease History: No Age Score: 1 Gender Score: 0     Physical Exam:    VS:  BP (!) 110/50   Pulse (!) 56   Ht 6\' 1"  (1.854 m)   Wt 261 lb (118.4 kg)   SpO2 97%   BMI 34.43 kg/m     Wt Readings from Last 3 Encounters:  08/12/20 261 lb (118.4 kg)  02/12/20 264 lb 3.2 oz (119.8 kg)  01/16/20 269 lb 3.2 oz (122.1 kg)     Constitutional:      Appearance: Healthy appearance. Not in distress.  Neck:     Vascular: JVD normal.  Pulmonary:     Effort: Pulmonary effort is normal.     Breath sounds: No wheezing. No rales.  Cardiovascular:     Bradycardia present. Regular rhythm. Normal S1. Normal S2.     Murmurs: There is no murmur.  Edema:    Peripheral edema absent.  Abdominal:     Palpations: Abdomen is soft. There is no hepatomegaly.  Skin:    General: Skin is warm and dry.  Neurological:     General: No focal deficit present.     Mental Status: Alert and oriented to person, place and time.     Cranial Nerves: Cranial nerves are intact.          ASSESSMENT & PLAN:    1. HFimpEF (Heart Failure with improved EF) EF normal by echocardiogram in 2018.  NYHA II.  Volume status stable.  He does not take diuretics.  He is not on beta-blocker due to bradycardia.  Continue ACE inhibitor, hydralazine.  F/u 6 mos.   2. Persistent atrial fibrillation (HCC) He is s/p multiple DCCVs.  He failed Amiodarone Rx and remains on Dofetilide.  His Warfarin is managed here in our clinic and has remained therapeutic for months.  He has not had an episode of AFib since last Sept.  If he has recurrent episodes requiring DCCV, it may be worthwhile to refer to EP to consider PVI ablation.    3. Mixed hyperlipidemia LDL 102 and Trigs 193.  He plans to start exercising and changing his diet to lose weight. Continue current dose of Pravastatin.  Arrange f/u lipids prior to next visit in 6 mos.        Dispo:  Return in about 6 months (around 02/11/2021) for Routine 6 month follow up with Dr. 02/13/2021..   Medication Adjustments/Labs and Tests Ordered: Current medicines are reviewed at length with the patient today.  Concerns regarding medicines are outlined above.  Tests Ordered: Orders Placed This Encounter  Procedures  . Lipid Profile  . EKG 12-Lead  Medication Changes: No orders of the defined types were placed in this encounter.   Signed, Tereso Newcomer, PA-C  08/12/2020 8:50 AM    [facility] [address] [place], [place], [place]  [address] Phone: [phone]; Fax: [phone]

## 2020-08-12 ENCOUNTER — Ambulatory Visit (INDEPENDENT_AMBULATORY_CARE_PROVIDER_SITE_OTHER): Payer: BC Managed Care – PPO | Admitting: Physician Assistant

## 2020-08-12 ENCOUNTER — Encounter: Payer: Self-pay | Admitting: Physician Assistant

## 2020-08-12 ENCOUNTER — Other Ambulatory Visit: Payer: Self-pay

## 2020-08-12 VITALS — BP 110/50 | HR 56 | Ht 73.0 in | Wt 261.0 lb

## 2020-08-12 DIAGNOSIS — G4733 Obstructive sleep apnea (adult) (pediatric): Secondary | ICD-10-CM

## 2020-08-12 DIAGNOSIS — I5032 Chronic diastolic (congestive) heart failure: Secondary | ICD-10-CM | POA: Diagnosis not present

## 2020-08-12 DIAGNOSIS — I4819 Other persistent atrial fibrillation: Secondary | ICD-10-CM

## 2020-08-12 DIAGNOSIS — E782 Mixed hyperlipidemia: Secondary | ICD-10-CM

## 2020-08-12 NOTE — Patient Instructions (Addendum)
Medication Instructions:  Your physician recommends that you continue on your current medications as directed. Please refer to the Current Medication list given to you today. *If you need a refill on your cardiac medications before your next appointment, please call your pharmacy*   Lab Work: Your physician recommends that you return for a FASTING lipid profile: Monday, October 17 between 7:30 - 4:30 fasting from 12 the night before.   If you have labs (blood work) drawn today and your tests are completely normal, you will receive your results only by: Marland Kitchen MyChart Message (if you have MyChart) OR . A paper copy in the mail If you have any lab test that is abnormal or we need to change your treatment, we will call you to review the results.   Testing/Procedures: -None   Follow-Up: At [facility], you and your health needs are our priority.  As part of our continuing mission to provide you with exceptional heart care, we have created designated Provider Care Teams.  These Care Teams include your primary Cardiologist (physician) and Advanced Practice Providers (APPs -  Physician Assistants and Nurse Practitioners) who all work together to provide you with the care you need, when you need it.  We recommend signing up for the patient portal called "MyChart".  Sign up information is provided on this After Visit Summary.  MyChart is used to connect with patients for Virtual Visits (Telemedicine).  Patients are able to view lab/test results, encounter notes, upcoming appointments, etc.  Non-urgent messages can be sent to your provider as well.   To learn more about what you can do with MyChart, go to [email or web address].    Your next appointment:   6 month(s)  The format for your next appointment:   In Person  Provider:   [name], MD   Other Instructions Your physician wants you to follow-up in: 6 months with Dr. Melburn Popper.  You will receive a reminder letter in the mail two months  in advance. If you don't receive a letter, please call our office to schedule the follow-up appointment.

## 2020-09-10 ENCOUNTER — Ambulatory Visit (INDEPENDENT_AMBULATORY_CARE_PROVIDER_SITE_OTHER): Payer: BC Managed Care – PPO | Admitting: *Deleted

## 2020-09-10 ENCOUNTER — Other Ambulatory Visit: Payer: Self-pay

## 2020-09-10 DIAGNOSIS — Z5181 Encounter for therapeutic drug level monitoring: Secondary | ICD-10-CM

## 2020-09-10 LAB — POCT INR: INR: 2 (ref 2.0–3.0)

## 2020-09-10 NOTE — Patient Instructions (Signed)
Description   Today take 2.5 tablets of Warfarin then continue taking Warfarin 2 tablets daily. Recheck INR in 10 weeks.  Call us with any concerns or changes. # [id number]- [id number]

## 2020-09-18 ENCOUNTER — Other Ambulatory Visit: Payer: Self-pay | Admitting: Cardiovascular Disease

## 2020-10-27 ENCOUNTER — Other Ambulatory Visit: Payer: Self-pay | Admitting: Cardiovascular Disease

## 2020-10-31 ENCOUNTER — Encounter (HOSPITAL_BASED_OUTPATIENT_CLINIC_OR_DEPARTMENT_OTHER): Payer: Self-pay

## 2020-10-31 ENCOUNTER — Other Ambulatory Visit: Payer: Self-pay

## 2020-10-31 ENCOUNTER — Emergency Department (HOSPITAL_BASED_OUTPATIENT_CLINIC_OR_DEPARTMENT_OTHER)
Admission: EM | Admit: 2020-10-31 | Discharge: 2020-10-31 | Disposition: A | Discharge status: Home / Self Care | Payer: BC Managed Care – PPO | Attending: Emergency Medicine | Admitting: Emergency Medicine

## 2020-10-31 ENCOUNTER — Emergency Department (HOSPITAL_BASED_OUTPATIENT_CLINIC_OR_DEPARTMENT_OTHER): Payer: BC Managed Care – PPO

## 2020-10-31 DIAGNOSIS — Z79899 Other long term (current) drug therapy: Secondary | ICD-10-CM | POA: Diagnosis not present

## 2020-10-31 DIAGNOSIS — R002 Palpitations: Secondary | ICD-10-CM | POA: Diagnosis present

## 2020-10-31 DIAGNOSIS — I493 Ventricular premature depolarization: Secondary | ICD-10-CM | POA: Insufficient documentation

## 2020-10-31 DIAGNOSIS — Z7901 Long term (current) use of anticoagulants: Secondary | ICD-10-CM | POA: Insufficient documentation

## 2020-10-31 DIAGNOSIS — Z8679 Personal history of other diseases of the circulatory system: Secondary | ICD-10-CM | POA: Diagnosis not present

## 2020-10-31 LAB — BASIC METABOLIC PANEL
Anion gap: 6 (ref 5–15)
BUN: 14 mg/dL (ref 8–23)
CO2: 26 mmol/L (ref 22–32)
Calcium: 8.6 mg/dL — ABNORMAL LOW (ref 8.9–10.3)
Chloride: 107 mmol/L (ref 98–111)
Creatinine, Ser: 1.19 mg/dL (ref 0.61–1.24)
GFR, Estimated: >60 mL/min (ref >60)
Glucose, Bld: 100 mg/dL — ABNORMAL HIGH (ref 70–99)
Potassium: 3.7 mmol/L (ref 3.5–5.1)
Sodium: 139 mmol/L (ref 135–145)

## 2020-10-31 LAB — CBC
HCT: 41.5 % (ref 39.0–52.0)
Hemoglobin: 14 g/dL (ref 13.0–17.0)
MCH: 31.5 pg (ref 26.0–34.0)
MCHC: 33.7 g/dL (ref 30.0–36.0)
MCV: 93.3 fL (ref 80.0–100.0)
Platelets: 276 10*3/uL (ref 150–400)
RBC: 4.45 MIL/uL (ref 4.22–5.81)
RDW: 12.8 % (ref 11.5–15.5)
WBC: 7.3 10*3/uL (ref 4.0–10.5)
nRBC: 0 % (ref 0.0–0.2)

## 2020-10-31 NOTE — ED Provider Notes (Signed)
[facility] Provider Note: Lowella Dell, MD, FACEP  CSN: [id number] MRN: [id number] ARRIVAL: 10/31/20 at 2021 ROOM: MH04/MH04   CHIEF COMPLAINT  Irregular Heart Beat   HISTORY OF PRESENT ILLNESS  10/31/20 11:02 PM Ruben [name] is a 70 y.o. male with a history of atrial fibrillation.  He has had a history of 5 cardioversions in the past decade.  He is here with palpitations that began about 3 PM.  He states his heart rate is normally below 60 but it went up to the 70s and felt irregular.  His symptoms waxed and waned, worse with exertion which was also associated with dyspnea, which was not severe, but not chest pain.  His symptoms have subsequently abated.  He is not feeling palpitations and he is not short of breath.  He is on Coumadin and Tikosyn.   Past Medical History:  Diagnosis Date   Atrial fibrillation (HCC)    Cough    Non-ischemic cardiomyopathy (HCC)        OSA (obstructive sleep apnea)     Past Surgical History:  Procedure Laterality Date   CARDIOVERSION N/A 03/02/2013   Procedure: CARDIOVERSION;  Surgeon: [name], MD;  Location: [facility] ENDOSCOPY;  Service: Cardiovascular;  Laterality: N/A;   CARDIOVERSION N/A 08/05/2016   Procedure: CARDIOVERSION;  Surgeon: Chilton Si, MD;  Location: [facility] ENDOSCOPY;  Service: Cardiovascular;  Laterality: N/A;   CARDIOVERSION N/A 05/09/2018   Procedure: CARDIOVERSION;  Surgeon: Jodelle Red, MD;  Location: [facility] ENDOSCOPY;  Service: Cardiovascular;  Laterality: N/A;   CARDIOVERSION N/A 01/09/2020   Procedure: CARDIOVERSION;  Surgeon: Chilton Si, MD;  Location: [facility] ENDOSCOPY;  Service: Cardiovascular;  Laterality: N/A;   FOOT FRACTURE SURGERY     PILONIDAL CYST EXCISION      Family History  Problem Relation Age of Onset   Cancer Mother    Stroke Mother    Hypertension Mother    Memory loss Mother    Prostate cancer Father     Social History   Tobacco Use   Smoking status: Never    Smokeless tobacco: Never  Vaping Use   Vaping Use: Never used  Substance Use Topics   Alcohol use: No   Drug use: No    Prior to Admission medications   Medication Sig Start Date End Date Taking? Authorizing Provider  acetaminophen (TYLENOL) 500 MG tablet Take 1,000 mg by mouth every 8 (eight) hours as needed for mild pain or headache.    [provider]  diclofenac Sodium (VOLTAREN) 1 % GEL Apply 2 g topically every other day.    [provider]  dofetilide (TIKOSYN) 500 MCG capsule TAKE ONE CAPSULE BY MOUTH TWICE A DAY 06/10/20   Nahser, Deloris Ping, MD  guaiFENesin (MUCINEX) 600 MG 12 hr tablet Take 600 mg by mouth 2 (two) times daily as needed for cough or to loosen phlegm.    [provider]  hydrALAZINE (APRESOLINE) 25 MG tablet TAKE ONE HALF TABLET BY MOUTH THREE TIMES A DAY 10/28/20   Nahser, Deloris Ping, MD  lisinopril (ZESTRIL) 5 MG tablet TAKE ONE TABLET BY MOUTH DAILY 09/18/20   Nahser, Deloris Ping, MD  magnesium oxide (MAG-OX) 400 (241.3 Mg) MG tablet Take 1 tablet by mouth twice daily 02/19/20   Newman Nip, NP  neomycin-bacitracin-polymyxin (NEOSPORIN) ointment Apply 1 application topically daily as needed for wound care.    [provider]  pravastatin (PRAVACHOL) 20 MG tablet Take 20 mg by mouth daily.  [provider]  PREVIDENT 5000 BOOSTER PLUS 1.1 % PSTE Use as directed 1 application in the mouth or throat daily.  08/09/19   [provider]  warfarin (COUMADIN) 2 MG tablet TAKE 1 TO 2 TABLETS BY MOUTH DAILY OR AS DIRECTED BY COUMADIN CLINIC 08/12/20   Nahser, Deloris Ping, MD    Allergies Patient has no known allergies.   REVIEW OF SYSTEMS  Negative except as noted here or in the History of Present Illness.   PHYSICAL EXAMINATION  Initial Vital Signs Blood pressure 118/73, pulse (!) 49, temperature 98.3 F (36.8 C), temperature source Oral, resp. rate 20, height 6\' 4"  (1.93 m), weight 117.9 kg, SpO2 96  %.  Examination General: Well-developed, well-nourished male in no acute distress; appearance consistent with age of record HENT: normocephalic; atraumatic Eyes: Normal appearance Neck: supple Heart: regular rate and rhythm; occasional PVCs Lungs: clear to auscultation bilaterally Abdomen: soft; nondistended; nontender; bowel sounds present Extremities: No deformity; full range of motion; trace edema of lower legs Neurologic: Awake, alert and oriented; motor function intact in all extremities and symmetric; no facial droop Skin: Warm and dry Psychiatric: Normal mood and affect   RESULTS  Summary of this visit's results, reviewed and interpreted by myself:   EKG Interpretation  Date/Time:  Thursday October 31 2020 20:32:47 EDT Ventricular Rate:  81 PR Interval:  176 QRS Duration: 108 QT Interval:  424 QTC Calculation: 492 R Axis:   -28 Text Interpretation: Sinus rhythm with frequent Premature ventricular complexes Prolonged QT Abnormal ECG PVCs not seen previously Confirmed by Krista Godsil (09-13-1983) on 10/31/2020 10:31:26 PM       Laboratory Studies: Results for orders placed or performed during the hospital encounter of 10/31/20 (from the past 24 hour(s))  Basic metabolic panel     Status: Abnormal   Collection Time: 10/31/20  9:35 PM  Result Value Ref Range   Sodium 139 135 - 145 mmol/L   Potassium 3.7 3.5 - 5.1 mmol/L   Chloride 107 98 - 111 mmol/L   CO2 26 22 - 32 mmol/L   Glucose, Bld 100 (H) 70 - 99 mg/dL   BUN 14 8 - 23 mg/dL   Creatinine, Ser 11/02/20 0.61 - 1.24 mg/dL   Calcium 8.6 (L) 8.9 - 10.3 mg/dL   GFR, Estimated 5.18 >84 mL/min   Anion gap 6 5 - 15  CBC     Status: None   Collection Time: 10/31/20  9:35 PM  Result Value Ref Range   WBC 7.3 4.0 - 10.5 K/uL   RBC 4.45 4.22 - 5.81 MIL/uL   Hemoglobin 14.0 13.0 - 17.0 g/dL   HCT 11/02/20 60.6 - 30.1 %   MCV 93.3 80.0 - 100.0 fL   MCH 31.5 26.0 - 34.0 pg   MCHC 33.7 30.0 - 36.0 g/dL   RDW 60.1 09.3 - 23.5 %    Platelets 276 150 - 400 K/uL   nRBC 0.0 0.0 - 0.2 %   Imaging Studies: DG Chest 2 View  Result Date: 10/31/2020 CLINICAL DATA:  Irregular heart rate. EXAM: CHEST - 2 VIEW COMPARISON:  December 11, 2019 FINDINGS: The heart size and mediastinal contours are within normal limits. Tortuous thoracic aorta. Lingular scarring similar prior. No new focal consolidation. No pleural effusion. No pneumothorax. The visualized skeletal structures are unremarkable. IMPRESSION: No acute cardiopulmonary process. Electronically Signed   By: December 13, 2019 MD   On: 10/31/2020 21:20    ED COURSE and MDM  Nursing notes, initial  and subsequent vitals signs, including pulse oximetry, reviewed and interpreted by myself.  Vitals:   10/31/20 2032 10/31/20 2032 10/31/20 2230  BP:  (!) 126/93 118/73  Pulse:  82 (!) 49  Resp:  18 20  Temp:  98.3 F (36.8 C)   TempSrc:  Oral   SpO2:  96% 96%  Weight: 117.9 kg    Height: 6\' 4"  (1.93 m)     Medications - No data to display  The patient is currently in sinus rhythm with frequent isolated PVCs.  He may have been in atrial fibrillation early and self converted.  He is asymptomatic and has not been having chest pain or shortness of breath while in the ED.  His rhythm strip was reviewed for his entire time on the monitor and showed normal sinus rhythm with PVCs but no other arrhythmia.  His cardiologist is Dr. and we will have him follow-up for evaluation of his PVCs as he has no history of those.  PROCEDURES  Procedures   ED DIAGNOSES     ICD-10-CM   1. Palpitations  R00.2     2. PVCs (premature ventricular contractions)  I49.3          Anika Shore, MD 10/31/20 2316

## 2020-10-31 NOTE — ED Triage Notes (Addendum)
Pt c/o irregular HR x 4-5 hours-denies CP-reports hx of AFib with cardioversion-NAD-to triage in w/c

## 2020-11-01 ENCOUNTER — Telehealth: Payer: Self-pay | Admitting: Cardiovascular Disease

## 2020-11-01 DIAGNOSIS — I4891 Unspecified atrial fibrillation: Secondary | ICD-10-CM

## 2020-11-01 DIAGNOSIS — I5022 Chronic systolic (congestive) heart failure: Secondary | ICD-10-CM

## 2020-11-01 DIAGNOSIS — I493 Ventricular premature depolarization: Secondary | ICD-10-CM

## 2020-11-01 NOTE — Telephone Encounter (Signed)
Pt made aware of recommendations. Aware office will call to arrange echo. Aware monitor will be sent via mail and once completed to mail it back.  Aware will forward for approval to add pt onto MD schedule on 8/25 @ 10:00am Will let pt know.

## 2020-11-01 NOTE — Telephone Encounter (Signed)
Pt went to the mailbox yesterday, about 3pm, and experienced SOB and racing heart. Typically HRs avg 40-50s, but were running in the 80s during episodes yesterday. ED mentioned pt was having PVCs and should follow  up with his cardiologist. Aware will forward to Dr. [name] for review/advisement. Informed pt that he may want to order a monitor (that we would mail to his home) and have him wear that first to determine if PVC burden/AFib burden the issue. Pt reports that he is going on vacation Sunday and will be gone for one week. Aware office will call once reviewed/advised. Patient verbalized understanding and agreeable to plan.

## 2020-11-01 NOTE — Telephone Encounter (Signed)
Pt was seen in the er for irregular heartbeat last night that has now cleared up, pt was diagnosed with premature ventricular contraction pt would like to know if he should come in and be seen.. please advise

## 2020-11-01 NOTE — Telephone Encounter (Signed)
Left message to call back  

## 2020-11-04 ENCOUNTER — Ambulatory Visit (INDEPENDENT_AMBULATORY_CARE_PROVIDER_SITE_OTHER): Payer: BC Managed Care – PPO

## 2020-11-04 ENCOUNTER — Encounter: Payer: Self-pay | Admitting: *Deleted

## 2020-11-04 DIAGNOSIS — I4891 Unspecified atrial fibrillation: Secondary | ICD-10-CM

## 2020-11-04 DIAGNOSIS — I493 Ventricular premature depolarization: Secondary | ICD-10-CM

## 2020-11-04 NOTE — Telephone Encounter (Signed)
Pt scheduled for 8/25 (pt cannot do 8/24) to f/u w/ Dr. [name]. Pt agreeable to plan.

## 2020-11-04 NOTE — Telephone Encounter (Signed)
Left message to call back  

## 2020-11-04 NOTE — Progress Notes (Unsigned)
Patient enrolled for [employer] to mail a 3 day ZI XT monitor to address on file.   Letter with instructions mailed to patient.

## 2020-11-07 ENCOUNTER — Other Ambulatory Visit (HOSPITAL_COMMUNITY): Payer: Medicare Other

## 2020-11-13 DIAGNOSIS — I4891 Unspecified atrial fibrillation: Secondary | ICD-10-CM

## 2020-11-13 DIAGNOSIS — I493 Ventricular premature depolarization: Secondary | ICD-10-CM | POA: Diagnosis not present

## 2020-11-17 ENCOUNTER — Other Ambulatory Visit: Payer: Self-pay | Admitting: Cardiovascular Disease

## 2020-11-18 NOTE — Telephone Encounter (Signed)
Prescription refill request received for warfarin Lov: 02/12/20 [name], MD) Next INR check: 11/19/20 Warfarin tablet strength: 4mg   Appropriate dose and refill sent to requested pharmacy.

## 2020-11-19 ENCOUNTER — Other Ambulatory Visit: Payer: Self-pay

## 2020-11-19 ENCOUNTER — Ambulatory Visit (INDEPENDENT_AMBULATORY_CARE_PROVIDER_SITE_OTHER): Payer: BC Managed Care – PPO | Admitting: *Deleted

## 2020-11-19 DIAGNOSIS — Z5181 Encounter for therapeutic drug level monitoring: Secondary | ICD-10-CM

## 2020-11-19 LAB — POCT INR: INR: 1.8 — AB (ref 2.0–3.0)

## 2020-11-19 NOTE — Patient Instructions (Signed)
Description   Today take 2.5 tablets of Warfarin then continue taking Warfarin 2 tablets daily. Recheck INR 4 weeks with Dr. [name] Appt (normally 10 weeks). Call us with any concerns or changes. # [id number]- [id number]

## 2020-11-20 ENCOUNTER — Ambulatory Visit (HOSPITAL_COMMUNITY): Payer: BC Managed Care – PPO | Attending: Cardiovascular Disease

## 2020-11-20 DIAGNOSIS — I493 Ventricular premature depolarization: Secondary | ICD-10-CM | POA: Insufficient documentation

## 2020-11-20 DIAGNOSIS — I5022 Chronic systolic (congestive) heart failure: Secondary | ICD-10-CM | POA: Diagnosis present

## 2020-11-20 DIAGNOSIS — I4891 Unspecified atrial fibrillation: Secondary | ICD-10-CM | POA: Insufficient documentation

## 2020-11-20 LAB — ECHOCARDIOGRAM COMPLETE
Area-P 1/2: 3.51 cm2
S' Lateral: 4.5 cm

## 2020-11-20 MED ORDER — PERFLUTREN LIPID MICROSPHERE
1.0000 mL | INTRAVENOUS | Status: AC | PRN
  Administered 2020-11-20: 2 mL via INTRAVENOUS

## 2020-12-09 ENCOUNTER — Other Ambulatory Visit (HOSPITAL_COMMUNITY): Payer: Self-pay | Admitting: Nurse Practitioner

## 2020-12-12 ENCOUNTER — Ambulatory Visit (INDEPENDENT_AMBULATORY_CARE_PROVIDER_SITE_OTHER): Payer: BC Managed Care – PPO | Admitting: *Deleted

## 2020-12-12 ENCOUNTER — Other Ambulatory Visit: Payer: Self-pay

## 2020-12-12 ENCOUNTER — Ambulatory Visit (INDEPENDENT_AMBULATORY_CARE_PROVIDER_SITE_OTHER): Payer: BC Managed Care – PPO | Admitting: Cardiovascular Disease

## 2020-12-12 ENCOUNTER — Encounter: Payer: Self-pay | Admitting: Cardiovascular Disease

## 2020-12-12 VITALS — BP 118/60 | HR 50 | Ht 72.0 in | Wt 261.6 lb

## 2020-12-12 DIAGNOSIS — I480 Paroxysmal atrial fibrillation: Secondary | ICD-10-CM | POA: Diagnosis not present

## 2020-12-12 DIAGNOSIS — Z5181 Encounter for therapeutic drug level monitoring: Secondary | ICD-10-CM | POA: Diagnosis not present

## 2020-12-12 DIAGNOSIS — E782 Mixed hyperlipidemia: Secondary | ICD-10-CM | POA: Diagnosis not present

## 2020-12-12 DIAGNOSIS — I10 Essential (primary) hypertension: Secondary | ICD-10-CM

## 2020-12-12 LAB — BASIC METABOLIC PANEL
BUN/Creatinine Ratio: 10 (ref 10–24)
BUN: 12 mg/dL (ref 8–27)
CO2: 24 mmol/L (ref 20–29)
Calcium: 9 mg/dL (ref 8.6–10.2)
Chloride: 102 mmol/L (ref 96–106)
Creatinine, Ser: 1.19 mg/dL (ref 0.76–1.27)
Glucose: 88 mg/dL (ref 65–99)
Potassium: 4.3 mmol/L (ref 3.5–5.2)
Sodium: 139 mmol/L (ref 134–144)
eGFR: 66 mL/min/{1.73_m2} (ref >59)

## 2020-12-12 LAB — LIPID PANEL
Chol/HDL Ratio: 4 ratio (ref 0.0–5.0)
Cholesterol, Total: 174 mg/dL (ref 100–199)
HDL: 44 mg/dL (ref >39)
LDL Chol Calc (NIH): 103 mg/dL — ABNORMAL HIGH (ref 0–99)
Triglycerides: 153 mg/dL — ABNORMAL HIGH (ref 0–149)
VLDL Cholesterol Cal: 27 mg/dL (ref 5–40)

## 2020-12-12 LAB — POCT INR: INR: 2.7 (ref 2.0–3.0)

## 2020-12-12 LAB — ALT: ALT: 14 IU/L (ref 0–44)

## 2020-12-12 LAB — MAGNESIUM: Magnesium: 2.1 mg/dL (ref 1.6–2.3)

## 2020-12-12 NOTE — Progress Notes (Signed)
Cardiology Office Note   Date:  12/12/2020   ID:  Ruben [name], Ruben [name] 10-25-1950, MRN [id number]  PCP:  Verlon Au, MD  Cardiologist:  previous [name] MD,  [name], MD   Chief Complaint  Patient presents with   Atrial Fibrillation   Congestive Heart Failure   Problem list 1. 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  Atrial fibrillation 3. Obesity 4. Hyperlipidemia 5. Obstructive sleep apnea 6. Mitral valve prolapse - mild MR by echo Sept. 2014    [name] - notes from Jakeim Sedore is a 70 y.o. male who presents for Six-month follow-up visit     He has a past history of nonischemic dilated cardiomyopathy and associated atrial fibrillation at that time.  He was hospitalized in February 2012 and was critically ill with severe systolic left ventricular dysfunction and atrial fibrillation.  He was successfully cardioverted back to sinus rhythm in May 2012.  He did well until August 2014 when he went back into atrial fibrillation after we had cut back his amiodarone to just 100 mg daily.  His dose was increased and we cardioverted him successfully on 03/02/13.  Since then he has maintained normal sinus rhythm.  His echocardiogram in 01/14/13 showed that his ejection fraction had improved to 50-55%. Since last visit has had no new cardiac symptoms. He has a history of sleep apnea and uses a CPAP machine successfully.  The patient has a history of hypercholesterolemia.  He is not experiencing any myalgias. Recent lab work includes normal hepatic function and normal thyroid function  He has had a slight cough which is resolving on Mucinex.He has gained weight.  His family gave him a fit.bit  And he is trying to get more exercise to help him lose weight.  November 07, 2015:  Hx of a viral cardiomyopathy in 2011. Never got over a cold - progressive shortness of breath.  EF was 10% at one point  EF  h Ruben [name] is doing well.  Seen for the first time today - transfer from [place]  Works for the state - [employer] .   No regular exercise .    No Cp ,  Breathing is good.  Jan. 16, 2018  Doing well. Has had a cold recently. Has not been exercising .   Jan.  25th, 2018:  Ruben [name] is seen back today for a work in visit.  During his last visit we decreased his dose of amiodarone. He has gone back into atrial fibrillation.  He has been active,   This past Sunday , he noticed that he was more short of breath,  More fatigued.   Was seen in the hospital - found to be in atrial fib  He notices a faster HR on his Fit Bit.   Has not missed any doses of anticoagulant .    Has not tried any other antiarrhythmics.   12/23/2016: Doing well Breathing is going well.   Has fatigue in the evening  Had Afib earlier this year, Started on Tikosyn 500 BID  Labs last week  Potassium = 4.4 No mag level was drawn  Echo from Feb. 2018 shows normal LV EF   Aug 18, 2017:   Ruben [name] is seen today for follow-up of his atrial fibrillation and chronic systolic congestive heart failure. His A. fib has been well controlled on Tikosyn. Had an episode of AF after shoveling snow.  Lasted for a day  or so   February 16, 2018: Is here to follow-up with his atrial fibrillation and chronic systolic congestive heart failure.  His atrial fibrillation is well controlled on Tikosyn.  His magnesium and potassium levels were drawn several days ago and are normal.  Had a brief episode of PAF - lasted for a month and then went back into rhythm.  Has had a cough - has been taking mucinex  He thought he had some fluid retention. EF was normal .     August 01, 2018:   Ruben [name] presents today for follow-up of his chronic systolic congestive heart failure and atrial fibrillation.    The  fraction was as low as 10% percent previously.   Recent echocardiogram from February, 2018 reveals an ejection fraction of 55 to  60%. He has a history of atrial fibrillation.  He is controlled on amiodarone but due to the long-term side effects of amiodarone he was changed to Tikosyn in April, 2019.   He was last seen by [name] in the atrial fibrillation clinic in January, 2020.  He has had some recurrent episodes of atrial fibrillation.  He was successfully cardioverted on May 19, 2018.  He is on chronic Coumadin therapy.  Has not had any further episodes of AF since that time . No CP , no dyspnea,  No fever, cough.   He scheduled for follow-up to see today for an EKG, basic metabolic panel and magnesium levels.  Oct. 13, 2020   Ruben [name] is see for follow up of his atrial fib and chronic systolic CHF No cp, no  Dyspnea, no presyncope  Gets some exercise  Needs to exercise more.    August 01, 2019: Ruben [name] is seen today for follow-up of his chronic systolic and diastolic congestive heart failure, hyperlipidemia, atrial fibrillation.  He remains on Tikosyn.  His last echocardiogram revealed an ejection fraction of 55 to 60%.  We were unable to evaluate diastolic function.  He has mild mitral regurgitation.  The left atrium is moderately dilated.  HR is slow. Has not had any syncope  Breathing is good.  Avoids salt,   May eat some processed meats   Oct. 25, 2021:  Ruben [name] is seen today for follow up of his  Chronic combined CHF  and AFib. He has mild MR. LA is moderately diated.  Has had some arrhythmias.  Became dizzy while driving.  Slightly light headed.  Went to [facility].  Replaced his mag.  Went to Afib clinic  Had cardioversion a month later.  Is on Tikosyn,  Is maintaining NSR  Potassium and magnesium levels were drawn recently and are normal.   Aug. 25, 2022: Ruben [name] is seen today for CHf and Afib  Is on Tikosyn.  Will get bmp and mag level today .  His potassium level is low on July 14.    Past Medical History:  Diagnosis Date   Atrial fibrillation (HCC)    Cough    Non-ischemic cardiomyopathy  (HCC)        OSA (obstructive sleep apnea)     Past Surgical History:  Procedure Laterality Date   CARDIOVERSION N/A 03/02/2013   Procedure: CARDIOVERSION;  Surgeon: [name], MD;  Location: [facility] ENDOSCOPY;  Service: Cardiovascular;  Laterality: N/A;   CARDIOVERSION N/A 08/05/2016   Procedure: CARDIOVERSION;  Surgeon: Chilton Si, MD;  Location: [facility] ENDOSCOPY;  Service: Cardiovascular;  Laterality: N/A;   CARDIOVERSION N/A 05/09/2018   Procedure: CARDIOVERSION;  Surgeon: Jodelle Red, MD;  Location: [facility]  ENDOSCOPY;  Service: Cardiovascular;  Laterality: N/A;   CARDIOVERSION N/A 01/09/2020   Procedure: CARDIOVERSION;  Surgeon: Chilton Si, MD;  Location: [facility] ENDOSCOPY;  Service: Cardiovascular;  Laterality: N/A;   FOOT FRACTURE SURGERY     PILONIDAL CYST EXCISION       Current Outpatient Medications  Medication Sig Dispense Refill   acetaminophen (TYLENOL) 500 MG tablet Take 1,000 mg by mouth every 8 (eight) hours as needed for mild pain or headache.     diclofenac Sodium (VOLTAREN) 1 % GEL Apply 2 g topically every other day.     dofetilide (TIKOSYN) 500 MCG capsule TAKE ONE CAPSULE BY MOUTH TWICE A DAY 180 capsule 2   guaiFENesin (MUCINEX) 600 MG 12 hr tablet Take 600 mg by mouth 2 (two) times daily as needed for cough or to loosen phlegm.     hydrALAZINE (APRESOLINE) 25 MG tablet TAKE ONE HALF TABLET BY MOUTH THREE TIMES A DAY 135 tablet 3   lisinopril (ZESTRIL) 5 MG tablet TAKE ONE TABLET BY MOUTH DAILY 90 tablet 3   magnesium oxide (MAG-OX) 400 (241.3 Mg) MG tablet Take 1 tablet by mouth twice daily 60 tablet 11   neomycin-bacitracin-polymyxin (NEOSPORIN) ointment Apply 1 application topically daily as needed for wound care.     pravastatin (PRAVACHOL) 20 MG tablet Take 20 mg by mouth daily.     PREVIDENT 5000 BOOSTER PLUS 1.1 % PSTE Use as directed 1 application in the mouth or throat daily.      warfarin (COUMADIN) 2 MG tablet TAKE 1 TO 2 TABLETS BY MOUTH  DAILY OR AS DIRECTED BY COUMADIN CLINIC 65 tablet 3   MAGNESIUM-OXIDE 400 (240 Mg) MG tablet TAKE ONE TABLET BY MOUTH TWICE A DAY (Patient not taking: Reported on 12/12/2020) 180 tablet 3   No current facility-administered medications for this visit.    Allergies:   Patient has no known allergies.    Social History:  The patient  reports that he has never smoked. He has never used smokeless tobacco. He reports that he does not drink alcohol and does not use drugs.   Family History:  The patient's family history includes Cancer in his mother; Hypertension in his mother; Memory loss in his mother; Prostate cancer in his father; Stroke in his mother.    ROS:  Please see the history of present illness.   Otherwise, review of systems are positive for none.   All other systems are reviewed and negative.    Physical Exam: Blood pressure 118/60, pulse (!) 50, height 6' (1.829 m), weight 261 lb 9.6 oz (118.7 kg), SpO2 98 %.  GEN:  Well nourished, well developed in no acute distress HEENT: Normal NECK: No JVD; No carotid bruits LYMPHATICS: No lymphadenopathy CARDIAC: RRR , no murmurs, rubs, gallops RESPIRATORY:  Clear to auscultation without rales, wheezing or rhonchi  ABDOMEN: Soft, non-tender, non-distended MUSCULOSKELETAL:  No edema; No deformity  SKIN: Warm and dry NEUROLOGIC:  Alert and oriented x 3   EKG:     11/04/20:   NSR ,  freq PVCs    Recent Labs: 08/07/2020: ALT 12; Magnesium 2.0 10/31/2020: BUN 14; Creatinine, Ser 1.19; Hemoglobin 14.0; Platelets 276; Potassium 3.7; Sodium 139    Lipid Panel    Component Value Date/Time   CHOL 176 08/07/2020 0740   TRIG 193 (H) 08/07/2020 0740   HDL 41 08/07/2020 0740   CHOLHDL 4.3 08/07/2020 0740   CHOLHDL 3.4 11/05/2015 0746   VLDL 31 (H) 11/05/2015 6759  LDLCALC 102 (H) 08/07/2020 0740   LDLDIRECT 90.2 12/30/2012 0740      Wt Readings from Last 3 Encounters:  12/12/20 261 lb 9.6 oz (118.7 kg)  10/31/20 260 lb (117.9 kg)   08/12/20 261 lb (118.4 kg)      ASSESSMENT AND PLAN:  1.  Chronic combined S/D  CHF :   Stable,     2. Persistant  atrial fibrillation,   he is maintaining sinus rhythm.  He is on Tikosyn.  We will draw a magnesium level and a basic metabolic profile today.  Continue Tikosyn.  We will have him see an APP in 6 months for follow-up visit, EKG, magnesium level, basic metabolic profile.     3. Dyslipidemia.  We will draw lipids today.     4.    Hypertension: Blood pressures well controlled.    [name], MD  12/12/2020 10:19 AM    [facility] [address] [place],  [address] [place], [place]  [address] Pager [phone] Phone: [phone]; Fax: [phone]

## 2020-12-12 NOTE — Patient Instructions (Signed)
Medication Instructions:  Your physician recommends that you continue on your current medications as directed. Please refer to the Current Medication list given to you today.  *If you need a refill on your cardiac medications before your next appointment, please call your pharmacy*   Lab Work: BMET, Magnesium, Lipid and ALT today  If you have labs (blood work) drawn today and your tests are completely normal, you will receive your results only by: MyChart Message (if you have MyChart) OR A paper copy in the mail If you have any lab test that is abnormal or we need to change your treatment, we will call you to review the results.   Testing/Procedures: None   Follow-Up: At [facility], you and your health needs are our priority.  As part of our continuing mission to provide you with exceptional heart care, we have created designated Provider Care Teams.  These Care Teams include your primary Cardiologist (physician) and Advanced Practice Providers (APPs -  Physician Assistants and Nurse Practitioners) who all work together to provide you with the care you need, when you need it.  We recommend signing up for the patient portal called "MyChart".  Sign up information is provided on this After Visit Summary.  MyChart is used to connect with patients for Virtual Visits (Telemedicine).  Patients are able to view lab/test results, encounter notes, upcoming appointments, etc.  Non-urgent messages can be sent to your provider as well.   To learn more about what you can do with MyChart, go to [email or web address].    Your next appointment:   6 month(s)- will need BMET, Magnesium and EKG at that time  The format for your next appointment:   In Person  Provider:   You will see one of the following Advanced Practice Providers on your designated Care Team:   Tereso Newcomer, PA-C [place] Aus, [place]    Other Instructions

## 2020-12-12 NOTE — Patient Instructions (Signed)
Description   Continue taking Warfarin 2 tablets daily. Recheck INR 10 weeks. Call us with any concerns or changes. # [phone]- [phone]

## 2020-12-17 ENCOUNTER — Encounter: Payer: Self-pay | Admitting: Cardiovascular Disease

## 2021-02-03 ENCOUNTER — Other Ambulatory Visit: Payer: BC Managed Care – PPO

## 2021-02-03 ENCOUNTER — Other Ambulatory Visit: Payer: Self-pay

## 2021-02-04 ENCOUNTER — Other Ambulatory Visit: Payer: BC Managed Care – PPO | Admitting: *Deleted

## 2021-02-04 DIAGNOSIS — I4819 Other persistent atrial fibrillation: Secondary | ICD-10-CM

## 2021-02-04 DIAGNOSIS — I5032 Chronic diastolic (congestive) heart failure: Secondary | ICD-10-CM

## 2021-02-04 DIAGNOSIS — E782 Mixed hyperlipidemia: Secondary | ICD-10-CM

## 2021-02-04 LAB — LIPID PANEL
Chol/HDL Ratio: 4 ratio (ref 0.0–5.0)
Cholesterol, Total: 178 mg/dL (ref 100–199)
HDL: 44 mg/dL
LDL Chol Calc (NIH): 106 mg/dL — ABNORMAL HIGH (ref 0–99)
Triglycerides: 159 mg/dL — ABNORMAL HIGH (ref 0–149)
VLDL Cholesterol Cal: 28 mg/dL (ref 5–40)

## 2021-02-18 ENCOUNTER — Ambulatory Visit (INDEPENDENT_AMBULATORY_CARE_PROVIDER_SITE_OTHER): Payer: BC Managed Care – PPO | Admitting: *Deleted

## 2021-02-18 ENCOUNTER — Other Ambulatory Visit: Payer: Self-pay

## 2021-02-18 DIAGNOSIS — Z5181 Encounter for therapeutic drug level monitoring: Secondary | ICD-10-CM

## 2021-02-18 LAB — POCT INR: INR: 2 (ref 2.0–3.0)

## 2021-02-18 NOTE — Patient Instructions (Signed)
Description   Today take 2.5 tablets then continue taking Warfarin 2 tablets daily. Recheck INR 10 weeks. Call us with any concerns or changes. # [phone]

## 2021-03-08 ENCOUNTER — Other Ambulatory Visit: Payer: Self-pay | Admitting: Cardiovascular Disease

## 2021-03-29 ENCOUNTER — Other Ambulatory Visit: Payer: Self-pay | Admitting: Cardiovascular Disease

## 2021-03-29 DIAGNOSIS — I480 Paroxysmal atrial fibrillation: Secondary | ICD-10-CM

## 2021-04-29 ENCOUNTER — Other Ambulatory Visit: Payer: Self-pay

## 2021-04-29 ENCOUNTER — Ambulatory Visit (INDEPENDENT_AMBULATORY_CARE_PROVIDER_SITE_OTHER): Payer: BC Managed Care – PPO | Admitting: *Deleted

## 2021-04-29 DIAGNOSIS — Z5181 Encounter for therapeutic drug level monitoring: Secondary | ICD-10-CM

## 2021-04-29 LAB — POCT INR: INR: 2.2 (ref 2.0–3.0)

## 2021-04-29 NOTE — Patient Instructions (Signed)
Description   Continue taking Warfarin 2 tablets daily. Recheck INR 10 weeks. Call us with any concerns or changes. # [phone]      

## 2021-05-14 ENCOUNTER — Telehealth: Payer: Self-pay | Admitting: Physician Assistant

## 2021-05-14 DIAGNOSIS — E782 Mixed hyperlipidemia: Secondary | ICD-10-CM

## 2021-05-14 DIAGNOSIS — I480 Paroxysmal atrial fibrillation: Secondary | ICD-10-CM

## 2021-05-14 NOTE — Telephone Encounter (Signed)
Called patient with Tereso Newcomer PA's response. Ordered lab work and put on schedule for the day before. Patient verbalized understanding.

## 2021-05-14 NOTE — Telephone Encounter (Signed)
He just had his Lipids checked in Oct.  We do not need to repeat that now. He just needs a BMET, Mg2+ for his upcoming appt. He does not need to fast for this.  Tereso Newcomer, PA-C    05/14/2021 4:42 PM

## 2021-05-14 NOTE — Telephone Encounter (Signed)
Ruben [name] is calling requesting orders be placed and scheduled for him to have fasting labs 02/13 prior to his appointment on 02/14.

## 2021-06-02 ENCOUNTER — Other Ambulatory Visit: Payer: BC Managed Care – PPO | Admitting: *Deleted

## 2021-06-02 ENCOUNTER — Other Ambulatory Visit: Payer: Self-pay

## 2021-06-02 DIAGNOSIS — D6869 Other thrombophilia: Secondary | ICD-10-CM | POA: Insufficient documentation

## 2021-06-02 DIAGNOSIS — I480 Paroxysmal atrial fibrillation: Secondary | ICD-10-CM

## 2021-06-02 DIAGNOSIS — E782 Mixed hyperlipidemia: Secondary | ICD-10-CM

## 2021-06-02 LAB — BASIC METABOLIC PANEL
BUN/Creatinine Ratio: 18 (ref 10–24)
BUN: 19 mg/dL (ref 8–27)
CO2: 24 mmol/L (ref 20–29)
Calcium: 9.1 mg/dL (ref 8.6–10.2)
Chloride: 105 mmol/L (ref 96–106)
Creatinine, Ser: 1.06 mg/dL (ref 0.76–1.27)
Glucose: 102 mg/dL — ABNORMAL HIGH (ref 70–99)
Potassium: 4.5 mmol/L (ref 3.5–5.2)
Sodium: 142 mmol/L (ref 134–144)
eGFR: 75 mL/min/{1.73_m2} (ref >59)

## 2021-06-02 LAB — MAGNESIUM: Magnesium: 1.8 mg/dL (ref 1.6–2.3)

## 2021-06-02 NOTE — Progress Notes (Addendum)
Cardiology Office Note:    Date:  06/03/2021   ID:  Ruben [name], DOB 1951-02-04, MRN [id number]  PCP:  Bartholome Bill, MD  [facility] Providers Cardiologist:  Mertie Moores, MD Cardiology APP:  Sharmon Revere    Referring MD: Bartholome Bill, MD   Chief Complaint:  Follow-up for A-fib, CHF    Patient Profile: HFimpEF (heart failure with improved ejection fraction)  Non-ischemic cardiomyopathy (tachycardia mediated) Echocardiogram 04/2010: EF 25 Echocardiogram 2/18: EF 55-60 Echocardiogram 8/22: EF 50-55 Persistent atrial fibrillation  Warfarin anticoagulation  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 to Dofetilide  Monitor 8/22: NSR, brief NSVT, bradycardia  MVP OSA on CPAP Hyperlipidemia   Prior CV Studies: Echocardiogram 11/20/20 EF 50-55, no RWMA, normal RVSF, mod LAE, trivial MR, mild dilation ascending aorta (39 mm)  Cardiac monitor 11/19/20 NSR, NSVT, bradycardia   Echocardiogram 06/01/16 Mild LVH, EF 55-60, mild MR, mod LAE     History of Present Illness:   AARIK [name] is a 71 y.o. male with the above problem list.  He was seen in the ED in 7/22 for palpitations.  A f/u echocardiogram demonstrated low normal EF and a monitor showed normal sinus rhythm, brief NSVT.  He saw Dr. Acie Fredrickson last in 8/22.  He returns for f/u.  He is here alone.  He is doing well without chest pain, shortness of breath, syncope, orthopnea, leg edema.    He notes significant issues with his INR changing when he eats lots of greens.    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   diclofenac Sodium (VOLTAREN) 1 % GEL Apply 2 g topically every other day.   dofetilide (TIKOSYN) 500 MCG capsule Take 1 capsule (500 mcg total) by mouth 2 (two) times daily.   guaiFENesin (MUCINEX) 600 MG 12 hr tablet Take 600 mg by mouth 2 (two) times daily as needed for cough or to loosen phlegm.   hydrALAZINE (APRESOLINE) 25 MG tablet TAKE ONE HALF TABLET BY MOUTH THREE TIMES A DAY   lisinopril (ZESTRIL) 5 MG tablet TAKE ONE TABLET BY MOUTH DAILY   magnesium oxide (MAG-OX) 400 (241.3 Mg) MG tablet Take 1 tablet by mouth twice daily   neomycin-bacitracin-polymyxin (NEOSPORIN) ointment Apply 1 application topically daily as needed for wound care.   pravastatin (PRAVACHOL) 20 MG tablet Take 20 mg by mouth daily.   PREVIDENT 5000 BOOSTER PLUS 1.1 % PSTE Use as directed 1 application in the mouth or throat daily.    warfarin (COUMADIN) 2 MG tablet TAKE ONE TO TWO TABLETS BY MOUTH DAILY AS DIRECTED BY COUMADIN CLINIC    Allergies:   Patient has no known allergies.   Social History   Tobacco Use   Smoking status: Never   Smokeless tobacco: Never  Vaping Use   Vaping Use: Never used  Substance Use Topics   Alcohol use: No   Drug use: No    Family Hx: The patient's family history includes Cancer in his mother; Hypertension in his mother; Memory loss in his mother; Prostate cancer  in his father; Stroke in his mother.  Review of Systems  Gastrointestinal:  Negative for hematochezia.  Genitourinary:  Negative for hematuria.    EKGs/Labs/Other Test Reviewed:    EKG:  EKG is   ordered today.  The ekg ordered today demonstrates NSR, HR 63, low voltage, left axis deviation, poor R wave progression, QTc 452  Recent Labs: 10/31/2020: Hemoglobin 14.0; Platelets 276 12/12/2020: ALT 14 06/02/2021: BUN 19; Creatinine, Ser 1.06; Magnesium 1.8; Potassium 4.5; Sodium 142   Recent Lipid Panel Recent Labs    02/04/21 0743  CHOL 178  TRIG 159*   HDL 44  LDLCALC 106*     Risk Assessment/Calculations:    CHA2DS2-VASc Score = 2   This indicates a 2.2% annual risk of stroke. The patient's score is based upon: CHF History: 1 HTN History: 0 Diabetes History: 0 Stroke History: 0 Vascular Disease History: 0 Age Score: 1 Gender Score: 0        Physical Exam:    VS:  BP 102/62 (BP Location: Right Arm, Patient Position: Sitting, Cuff Size: Large)    Pulse 63    Ht 6\' 4"  (1.93 m)    Wt 271 lb 3.2 oz (123 kg)    SpO2 96%    BMI 33.01 kg/m     Wt Readings from Last 3 Encounters:  06/03/21 271 lb 3.2 oz (123 kg)  12/12/20 261 lb 9.6 oz (118.7 kg)  10/31/20 260 lb (117.9 kg)    Constitutional:      Appearance: Healthy appearance. Not in distress.  Neck:     Vascular: JVD normal.  Pulmonary:     Effort: Pulmonary effort is normal.     Breath sounds: No wheezing. No rales.  Cardiovascular:     Normal rate. Regular rhythm. Normal S1. Normal S2.      Murmurs: There is no murmur.  Edema:    Peripheral edema absent.  Abdominal:     Palpations: Abdomen is soft.  Skin:    General: Skin is warm and dry.  Neurological:     General: No focal deficit present.     Mental Status: Alert and oriented to person, place and time.     Cranial Nerves: Cranial nerves are intact.        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  Cardiomyopathy felt to be due to tachycardia.  EF has returned to normal.  Most recent echocardiogram in August 2022 with EF 50-55.  Overall, volume status is stable.  He is NYHA II.  He is not on beta-blocker therapy due to bradycardia.  Continue lisinopril 5 mg daily.  Atrial fibrillation ([place]) History of multiple cardioversions and failed therapy with amiodarone.  Overall, he has been fairly stable with dofetilide.  QTc today is stable.  Recent magnesium somewhat less than 2.  I have asked him to increase his magnesium oxide to 800 mg in the morning  and 400 mg in the evening for 2 weeks, then resume 400 mg twice daily.  Repeat BMET, magnesium in 4 to 6 weeks.  He remains on warfarin therapy.  We discussed potentially switching to DOAC.  He would like to do this at his next Coumadin clinic visit.  Secondary hypercoagulable state ([place]) Coumadin is managed in our Coumadin clinic.  As noted, he would like to switch from Coumadin to a DOAC.  We discussed trying to arrange this now versus waiting until his next Coumadin clinic visit.  He  would like to do this in March at his next appointment.  I recommend transitioning to Apixaban 5 mg twice daily and stopping Coumadin at that time.  Pure hypercholesterolemia Lipids stable in October 2022.  He remains on pravastatin 20 mg daily.         Dispo:  Return in about 6 months (around 12/01/2021) for Routine follow up in 6 months with Dr.Nasher. .   Medication Adjustments/Labs and Tests Ordered: Current medicines are reviewed at length with the patient today.  Concerns regarding medicines are outlined above.  Tests Ordered: Orders Placed This Encounter  Procedures   Basic Metabolic Panel (BMET)   Magnesium   EKG 12-Lead   Medication Changes: No orders of the defined types were placed in this encounter.  Signed, Richardson Dopp, PA-C  06/03/2021 10:26 AM    [facility] [place], [place], [place]  [address] Phone: [phone]; Fax: [phone]

## 2021-06-03 ENCOUNTER — Ambulatory Visit (INDEPENDENT_AMBULATORY_CARE_PROVIDER_SITE_OTHER): Payer: BC Managed Care – PPO | Admitting: Physician Assistant

## 2021-06-03 ENCOUNTER — Encounter: Payer: Self-pay | Admitting: Physician Assistant

## 2021-06-03 VITALS — BP 102/62 | HR 63 | Ht 76.0 in | Wt 271.2 lb

## 2021-06-03 DIAGNOSIS — I4819 Other persistent atrial fibrillation: Secondary | ICD-10-CM

## 2021-06-03 DIAGNOSIS — E7800 Pure hypercholesterolemia, unspecified: Secondary | ICD-10-CM | POA: Diagnosis not present

## 2021-06-03 DIAGNOSIS — I5032 Chronic diastolic (congestive) heart failure: Secondary | ICD-10-CM | POA: Diagnosis not present

## 2021-06-03 DIAGNOSIS — D6869 Other thrombophilia: Secondary | ICD-10-CM | POA: Diagnosis not present

## 2021-06-03 NOTE — Assessment & Plan Note (Signed)
History of multiple cardioversions and failed therapy with amiodarone.  Overall, he has been fairly stable with dofetilide.  QTc today is stable.  Recent magnesium somewhat less than 2.  I have asked him to increase his magnesium oxide to 800 mg in the morning and 400 mg in the evening for 2 weeks, then resume 400 mg twice daily.  Repeat BMET, magnesium in 4 to 6 weeks.  He remains on warfarin therapy.  We discussed potentially switching to DOAC.  He would like to do this at his next Coumadin clinic visit.

## 2021-06-03 NOTE — Assessment & Plan Note (Addendum)
Coumadin is managed in our Coumadin clinic.  As noted, he would like to switch from Coumadin to a DOAC.  We discussed trying to arrange this now versus waiting until his next Coumadin clinic visit.  He would like to do this in March at his next appointment.  I recommend transitioning to Apixaban 5 mg twice daily and stopping Coumadin at that time.

## 2021-06-03 NOTE — Assessment & Plan Note (Signed)
Nonischemic cardiomyopathy.  Cardiomyopathy felt to be due to tachycardia.  EF has returned to normal.  Most recent echocardiogram in August 2022 with EF 50-55.  Overall, volume status is stable.  He is NYHA II.  He is not on beta-blocker therapy due to bradycardia.  Continue lisinopril 5 mg daily.

## 2021-06-03 NOTE — Assessment & Plan Note (Signed)
Lipids stable in October 2022.  He remains on pravastatin 20 mg daily.

## 2021-06-03 NOTE — Patient Instructions (Signed)
Medication Instructions:   INCREASE Mag Ox two tablet ( 800 mg) am and one tablet (400 mg) pm X 2 weeks. Than back to one tablet by mouth (400 mg) twice daily.  *If you need a refill on your cardiac medications before your next appointment, please call your pharmacy*   Lab Work:  Your physician recommends that you return for lab work on Tuesday, March 21 same day as coumadin. You can come in on the day of your appointment anytime between 7:30-4:30.   If you have labs (blood work) drawn today and your tests are completely normal, you will receive your results only by: MyChart Message (if you have MyChart) OR A paper copy in the mail If you have any lab test that is abnormal or we need to change your treatment, we will call you to review the results.   Testing/Procedures:  None ordered.    Follow-Up: At [facility], you and your health needs are our priority.  As part of our continuing mission to provide you with exceptional heart care, we have created designated Provider Care Teams.  These Care Teams include your primary Cardiologist (physician) and Advanced Practice Providers (APPs -  Physician Assistants and Nurse Practitioners) who all work together to provide you with the care you need, when you need it.  We recommend signing up for the patient portal called "MyChart".  Sign up information is provided on this After Visit Summary.  MyChart is used to connect with patients for Virtual Visits (Telemedicine).  Patients are able to view lab/test results, encounter notes, upcoming appointments, etc.  Non-urgent messages can be sent to your provider as well.   To learn more about what you can do with MyChart, go to [email or web address].    Your next appointment:   6 month(s)  The format for your next appointment:   In Person  Provider:   [name], MD

## 2021-07-08 ENCOUNTER — Other Ambulatory Visit: Payer: Self-pay

## 2021-07-08 ENCOUNTER — Other Ambulatory Visit: Payer: BC Managed Care – PPO | Admitting: *Deleted

## 2021-07-08 ENCOUNTER — Ambulatory Visit (INDEPENDENT_AMBULATORY_CARE_PROVIDER_SITE_OTHER): Payer: BC Managed Care – PPO | Admitting: *Deleted

## 2021-07-08 DIAGNOSIS — Z5181 Encounter for therapeutic drug level monitoring: Secondary | ICD-10-CM | POA: Diagnosis not present

## 2021-07-08 DIAGNOSIS — I5032 Chronic diastolic (congestive) heart failure: Secondary | ICD-10-CM

## 2021-07-08 DIAGNOSIS — D6869 Other thrombophilia: Secondary | ICD-10-CM

## 2021-07-08 DIAGNOSIS — E7800 Pure hypercholesterolemia, unspecified: Secondary | ICD-10-CM

## 2021-07-08 DIAGNOSIS — I4819 Other persistent atrial fibrillation: Secondary | ICD-10-CM

## 2021-07-08 LAB — BASIC METABOLIC PANEL
BUN/Creatinine Ratio: 12 (ref 10–24)
BUN: 14 mg/dL (ref 8–27)
CO2: 25 mmol/L (ref 20–29)
Calcium: 9.2 mg/dL (ref 8.6–10.2)
Chloride: 104 mmol/L (ref 96–106)
Creatinine, Ser: 1.2 mg/dL (ref 0.76–1.27)
Glucose: 107 mg/dL — ABNORMAL HIGH (ref 70–99)
Potassium: 4.7 mmol/L (ref 3.5–5.2)
Sodium: 140 mmol/L (ref 134–144)
eGFR: 65 mL/min/{1.73_m2} (ref >59)

## 2021-07-08 LAB — MAGNESIUM: Magnesium: 2 mg/dL (ref 1.6–2.3)

## 2021-07-08 LAB — POCT INR: INR: 2 (ref 2.0–3.0)

## 2021-07-08 MED ORDER — APIXABAN 5 MG PO TABS: 5.0000 mg | ORAL_TABLET | Freq: Two times a day (BID) | ORAL | 0 refills | Status: DC

## 2021-07-08 MED ORDER — APIXABAN 5 MG PO TABS: 5.0000 mg | ORAL_TABLET | Freq: Two times a day (BID) | ORAL | 5 refills | Status: DC

## 2021-07-08 NOTE — Patient Instructions (Signed)
Description   ?Do not take anymore Warfarin and start Eliquis 5mg  tomorrow morning and night.  ? ?Call with any concerns or changes. # [place]- [phone]  ?  ?  ?

## 2021-07-09 NOTE — Progress Notes (Signed)
Pt has been made aware of normal result and verbalized understanding.  jw

## 2021-08-02 ENCOUNTER — Other Ambulatory Visit: Payer: Self-pay | Admitting: Cardiovascular Disease

## 2021-08-04 NOTE — Telephone Encounter (Signed)
Prescription refill request for Eliquis received. ?Indication: Atrial Fib ?Last office visit: 06/03/21  Wende Mott PA-C ?Scr: 0.99 on 07/17/21 ?Age: 71 ?Weight: 123kg ? ?Based on above findings Eliquis 5mg  twice daily is the appropriate dose.  Refill approved. ? ?

## 2021-09-08 ENCOUNTER — Other Ambulatory Visit: Payer: Self-pay

## 2021-09-08 MED ORDER — LISINOPRIL 5 MG PO TABS: 5.0000 mg | ORAL_TABLET | Freq: Every day | ORAL | 2 refills | Status: DC

## 2021-09-11 ENCOUNTER — Telehealth: Payer: Self-pay | Admitting: Cardiovascular Disease

## 2021-09-11 MED ORDER — LISINOPRIL 5 MG PO TABS: 5.0000 mg | ORAL_TABLET | Freq: Every day | ORAL | 2 refills | Status: DC

## 2021-09-11 NOTE — Telephone Encounter (Signed)
Pt's medication was resent to pt's preferred pharmacy. Confirmation received.  

## 2021-09-11 NOTE — Addendum Note (Signed)
Addended by: Margaret Pyle D on: 09/11/2021 04:02 PM   Modules accepted: Orders

## 2021-09-11 NOTE — Telephone Encounter (Signed)
*  STAT* If patient is at the pharmacy, call can be transferred to refill team.   1. Which medications need to be refilled? (please list name of each medication and dose if known) lisinopril (ZESTRIL) 5 MG tablet  2. Which pharmacy/location (including street and city if local pharmacy) is medication to be sent to? [id number] - [place], [place] - [address]  3. Do they need a 30 day or 90 day supply? 90   Pt states this was sent to the wrong pharmacy and is requesting it be sent to [facility] instead.   Pt states he only has two left and is going out of town this weekend

## 2021-09-11 NOTE — Telephone Encounter (Signed)
Pt's medication was resent to pt's pharmacy as requested. Confirmation received.  °

## 2021-10-20 ENCOUNTER — Other Ambulatory Visit: Payer: Self-pay

## 2021-10-20 MED ORDER — HYDRALAZINE HCL 25 MG PO TABS: ORAL_TABLET | ORAL | 2 refills | Status: DC

## 2021-11-23 ENCOUNTER — Encounter: Payer: Self-pay | Admitting: Cardiovascular Disease

## 2021-11-23 NOTE — Progress Notes (Signed)
Cardiology Office Note   Date:  11/25/2021   ID:  Ruben [name], Ruben [name] November 04, 1950, MRN [id number]  PCP:  Verlon Au, MD  Cardiologist:  previous [name] MD,  [name], MD   Chief Complaint  Patient presents with   Atrial Fibrillation        Congestive Heart Failure        Problem list 1. 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  Atrial fibrillation 3. Obesity 4. Hyperlipidemia 5. Obstructive sleep apnea 6. Mitral valve prolapse - mild MR by echo Sept. 2014    [name] - notes from Lennie Vasco is a 71 y.o. male who presents for Six-month follow-up visit     He has a past history of nonischemic dilated cardiomyopathy and associated atrial fibrillation at that time.  He was hospitalized in February 2012 and was critically ill with severe systolic left ventricular dysfunction and atrial fibrillation.  He was successfully cardioverted back to sinus rhythm in May 2012.  He did well until August 2014 when he went back into atrial fibrillation after we had cut back his amiodarone to just 100 mg daily.  His dose was increased and we cardioverted him successfully on 03/02/13.  Since then he has maintained normal sinus rhythm.  His echocardiogram in 01/14/13 showed that his ejection fraction had improved to 50-55%. Since last visit has had no new cardiac symptoms. He has a history of sleep apnea and uses a CPAP machine successfully.  The patient has a history of hypercholesterolemia.  He is not experiencing any myalgias. Recent lab work includes normal hepatic function and normal thyroid function  He has had a slight cough which is resolving on Mucinex.He has gained weight.  His family gave him a fit.bit  And he is trying to get more exercise to help him lose weight.  November 07, 2015:  Hx of a viral cardiomyopathy in 2011. Never got over a cold - progressive shortness of breath.  EF was 10% at one  point  EF h Ruben [name] is doing well.  Seen for the first time today - transfer from [place]  Works for the state - [employer] .   No regular exercise .    No Cp ,  Breathing is good.  Jan. 16, 2018  Doing well. Has had a cold recently. Has not been exercising .   Jan.  25th, 2018:  Ruben [name] is seen back today for a work in visit.  During his last visit we decreased his dose of amiodarone. He has gone back into atrial fibrillation.  He has been active,   This past Sunday , he noticed that he was more short of breath,  More fatigued.   Was seen in the hospital - found to be in atrial fib  He notices a faster HR on his Fit Bit.   Has not missed any doses of anticoagulant .    Has not tried any other antiarrhythmics.   12/23/2016: Doing well Breathing is going well.   Has fatigue in the evening  Had Afib earlier this year, Started on Tikosyn 500 BID  Labs last week  Potassium = 4.4 No mag level was drawn  Echo from Feb. 2018 shows normal LV EF   Aug 18, 2017:   Ruben [name] is seen today for follow-up of his atrial fibrillation and chronic systolic congestive heart failure. His A. fib has been well controlled on Tikosyn. Had an episode  of AF after shoveling snow.  Lasted for a day or so   February 16, 2018: Is here to follow-up with his atrial fibrillation and chronic systolic congestive heart failure.  His atrial fibrillation is well controlled on Tikosyn.  His magnesium and potassium levels were drawn several days ago and are normal.  Had a brief episode of PAF - lasted for a month and then went back into rhythm.  Has had a cough - has been taking mucinex  He thought he had some fluid retention. EF was normal .     August 01, 2018:   Ruben [name] presents today for follow-up of his chronic systolic congestive heart failure and atrial fibrillation.    The  fraction was as low as 10% percent previously.   Recent echocardiogram from February, 2018 reveals an ejection fraction  of 55 to 60%. He has a history of atrial fibrillation.  He is controlled on amiodarone but due to the long-term side effects of amiodarone he was changed to Tikosyn in April, 2019.   He was last seen by [name] in the atrial fibrillation clinic in January, 2020.  He has had some recurrent episodes of atrial fibrillation.  He was successfully cardioverted on May 19, 2018.  He is on chronic Coumadin therapy.  Has not had any further episodes of AF since that time . No CP , no dyspnea,  No fever, cough.   He scheduled for follow-up to see today for an EKG, basic metabolic panel and magnesium levels.  Oct. 13, 2020   Ruben [name] is see for follow up of his atrial fib and chronic systolic CHF No cp, no  Dyspnea, no presyncope  Gets some exercise  Needs to exercise more.    August 01, 2019: Ruben [name] is seen today for follow-up of his chronic systolic and diastolic congestive heart failure, hyperlipidemia, atrial fibrillation.  He remains on Tikosyn.  His last echocardiogram revealed an ejection fraction of 55 to 60%.  We were unable to evaluate diastolic function.  He has mild mitral regurgitation.  The left atrium is moderately dilated.  HR is slow. Has not had any syncope  Breathing is good.  Avoids salt,   May eat some processed meats   Oct. 25, 2021:  Ruben [name] is seen today for follow up of his  Chronic combined CHF  and AFib. He has mild MR. LA is moderately diated.  Has had some arrhythmias.  Became dizzy while driving.  Slightly light headed.  Went to [facility].  Replaced his mag.  Went to Afib clinic  Had cardioversion a month later.  Is on Tikosyn,  Is maintaining NSR  Potassium and magnesium levels were drawn recently and are normal.   Aug. 25, 2022: Ruben [name] is seen today for CHf and Afib  Is on Tikosyn.  Will get bmp and mag level today .  His potassium level is low on July 14.    Aug. 7, 2023 Ruben [name] is seen for follow up of his CHF and atrial fib Has changed from coumadin to  Eliquis  Able to do his normal activities  Mows the lawn  Needs more exercise   Hx of HLD  Found out the if he takes the lisinopril at night , he feels better  No arrhythmias    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 11/07/2015   Atrial fibrillation (HCC)    Cough    Non-ischemic cardiomyopathy (HCC)    OSA (obstructive  sleep apnea)    Pure hypercholesterolemia 11/17/2010    Past Surgical History:  Procedure Laterality Date   CARDIOVERSION N/A 03/02/2013   Procedure: CARDIOVERSION;  Surgeon: [name], MD;  Location: [facility] ENDOSCOPY;  Service: Cardiovascular;  Laterality: N/A;   CARDIOVERSION N/A 08/05/2016   Procedure: CARDIOVERSION;  Surgeon: Chilton Si, MD;  Location: [facility] ENDOSCOPY;  Service: Cardiovascular;  Laterality: N/A;   CARDIOVERSION N/A 05/09/2018   Procedure: CARDIOVERSION;  Surgeon: Jodelle Red, MD;  Location: [facility] ENDOSCOPY;  Service: Cardiovascular;  Laterality: N/A;   CARDIOVERSION N/A 01/09/2020   Procedure: CARDIOVERSION;  Surgeon: Chilton Si, MD;  Location: [facility] ENDOSCOPY;  Service: Cardiovascular;  Laterality: N/A;   FOOT FRACTURE SURGERY     PILONIDAL CYST EXCISION       Current Outpatient Medications  Medication Sig Dispense Refill   acetaminophen (TYLENOL) 500 MG tablet Take 1,000 mg by mouth every 8 (eight) hours as needed for mild pain or headache.     apixaban (ELIQUIS) 5 MG TABS tablet TAKE 1 TABLET(5 MG) BY MOUTH TWICE DAILY 60 tablet 11   diclofenac Sodium (VOLTAREN) 1 % GEL Apply 2 g topically every other day.     dofetilide (TIKOSYN) 500 MCG capsule Take 1 capsule (500 mcg total) by mouth 2 (two) times daily. 180 capsule 2   guaiFENesin (MUCINEX) 600 MG 12 hr tablet Take 600 mg by mouth 2 (two) times daily as needed for cough or to loosen phlegm.     hydrALAZINE (APRESOLINE) 25 MG tablet TAKE ONE HALF TABLET BY MOUTH THREE TIMES A DAY 135 tablet 2   lisinopril  (ZESTRIL) 5 MG tablet Take 1 tablet (5 mg total) by mouth daily. 90 tablet 2   magnesium oxide (MAG-OX) 400 (241.3 Mg) MG tablet Take 1 tablet by mouth twice daily 60 tablet 11   neomycin-bacitracin-polymyxin (NEOSPORIN) ointment Apply 1 application topically daily as needed for wound care.     pravastatin (PRAVACHOL) 20 MG tablet Take 20 mg by mouth daily.     PREVIDENT 5000 BOOSTER PLUS 1.1 % PSTE Use as directed 1 application in the mouth or throat daily.      No current facility-administered medications for this visit.    Allergies:   Patient has no known allergies.    Social History:  The patient  reports that he has never smoked. He has never used smokeless tobacco. He reports that he does not drink alcohol and does not use drugs.   Family History:  The patient's family history includes Cancer in his mother; Hypertension in his mother; Memory loss in his mother; Prostate cancer in his father; Stroke in his mother.    ROS:  Please see the history of present illness.   Otherwise, review of systems are positive for none.   All other systems are reviewed and negative.    Physical Exam: Blood pressure 108/70, pulse (!) 57, height 6\' 4"  (1.93 m), weight 269 lb 3.2 oz (122.1 kg), SpO2 97 %.  GEN:  Well nourished, well developed in no acute distress HEENT: Normal NECK: No JVD; No carotid bruits LYMPHATICS: No lymphadenopathy CARDIAC: RRR , no murmurs, rubs, gallops RESPIRATORY:  Clear to auscultation without rales, wheezing or rhonchi  ABDOMEN: Soft, non-tender, non-distended MUSCULOSKELETAL:  No edema; No deformity  SKIN: Warm and dry NEUROLOGIC:  Alert and oriented x 3    EKG:      Aug. 8, 2023.  Sinus bradycardia , no ST or T wave changes.    Recent Labs: 12/12/2020: ALT  14 07/08/2021: BUN 14; Creatinine, Ser 1.20; Magnesium 2.0; Potassium 4.7; Sodium 140    Lipid Panel    Component Value Date/Time   CHOL 178 02/04/2021 0743   TRIG 159 (H) 02/04/2021 0743   HDL 44  02/04/2021 0743   CHOLHDL 4.0 02/04/2021 0743   CHOLHDL 3.4 11/05/2015 0746   VLDL 31 (H) 11/05/2015 0746   LDLCALC 106 (H) 02/04/2021 0743   LDLDIRECT 90.2 12/30/2012 0740      Wt Readings from Last 3 Encounters:  11/25/21 269 lb 3.2 oz (122.1 kg)  06/03/21 271 lb 3.2 oz (123 kg)  12/12/20 261 lb 9.6 oz (118.7 kg)      ASSESSMENT AND PLAN:  1.  Chronic combined S/D  CHF :  Echo from last year shows normal LV funciton     2. Persistant  atrial fibrillation,    Remains in NS, cont tikosyn Check BMP, Mag levels today    3. Dyslipidemia.  On pravachol.  Check lipids today  Will get Coronary artery score     4.    Hypertension:  BP is well controlled.   Cont current meds.     [name], MD  11/25/2021 8:11 AM    [facility] [address] [place],  [address] [place], [place]  [address] Pager [phone] Phone: [phone]; Fax: [phone]

## 2021-11-25 ENCOUNTER — Encounter: Payer: Self-pay | Admitting: Cardiovascular Disease

## 2021-11-25 ENCOUNTER — Ambulatory Visit (INDEPENDENT_AMBULATORY_CARE_PROVIDER_SITE_OTHER): Payer: BC Managed Care – PPO | Admitting: Cardiovascular Disease

## 2021-11-25 VITALS — BP 108/70 | HR 57 | Ht 76.0 in | Wt 269.2 lb

## 2021-11-25 DIAGNOSIS — E782 Mixed hyperlipidemia: Secondary | ICD-10-CM

## 2021-11-25 DIAGNOSIS — I480 Paroxysmal atrial fibrillation: Secondary | ICD-10-CM

## 2021-11-25 LAB — BASIC METABOLIC PANEL
BUN/Creatinine Ratio: 11 (ref 10–24)
BUN: 12 mg/dL (ref 8–27)
CO2: 23 mmol/L (ref 20–29)
Calcium: 9.4 mg/dL (ref 8.6–10.2)
Chloride: 101 mmol/L (ref 96–106)
Creatinine, Ser: 1.08 mg/dL (ref 0.76–1.27)
Glucose: 102 mg/dL — ABNORMAL HIGH (ref 70–99)
Potassium: 4.6 mmol/L (ref 3.5–5.2)
Sodium: 139 mmol/L (ref 134–144)
eGFR: 74 mL/min/{1.73_m2} (ref >59)

## 2021-11-25 LAB — LIPID PANEL
Chol/HDL Ratio: 3.8 ratio (ref 0.0–5.0)
Cholesterol, Total: 165 mg/dL (ref 100–199)
HDL: 44 mg/dL (ref >39)
LDL Chol Calc (NIH): 95 mg/dL (ref 0–99)
Triglycerides: 151 mg/dL — ABNORMAL HIGH (ref 0–149)
VLDL Cholesterol Cal: 26 mg/dL (ref 5–40)

## 2021-11-25 LAB — MAGNESIUM: Magnesium: 2 mg/dL (ref 1.6–2.3)

## 2021-11-25 LAB — ALT: ALT: 13 IU/L (ref 0–44)

## 2021-11-25 NOTE — Patient Instructions (Addendum)
Medication Instructions:  Your physician recommends that you continue on your current medications as directed. Please refer to the Current Medication list given to you today.  *If you need a refill on your cardiac medications before your next appointment, please call your pharmacy*  Lab Work: Your physician recommends that you have lab work today- BMET, Mag, Lipid panel, and ALT. If you have labs (blood work) drawn today and your tests are completely normal, you will receive your results only by: MyChart Message (if you have MyChart) OR A paper copy in the mail If you have any lab test that is abnormal or we need to change your treatment, we will call you to review the results.  Testing/Procedures: Cardiac CT scanning for calcium score (CAT scanning), is a noninvasive, special x-ray that produces cross-sectional images of the body using x-rays and a computer. CT scans help physicians diagnose and treat medical conditions. For some CT exams, a contrast material is used to enhance visibility in the area of the body being studied. CT scans provide greater clarity and reveal more details than regular x-ray exams.  Follow-Up: At [facility], you and your health needs are our priority.  As part of our continuing mission to provide you with exceptional heart care, we have created designated Provider Care Teams.  These Care Teams include your primary Cardiologist (physician) and Advanced Practice Providers (APPs -  Physician Assistants and Nurse Practitioners) who all work together to provide you with the care you need, when you need it.  We recommend signing up for the patient portal called "MyChart".  Sign up information is provided on this After Visit Summary.  MyChart is used to connect with patients for Virtual Visits (Telemedicine).  Patients are able to view lab/test results, encounter notes, upcoming appointments, etc.  Non-urgent messages can be sent to your provider as well.   To learn more  about what you can do with MyChart, go to [email or web address].    Your next appointment:   6 month(s)  The format for your next appointment:   Virtual Visit   Provider:   Chelsea Aus, PA-C, Jari Favre, PA-C, Ronie Spies, PA-C, Robin Searing, NP, Nada Boozer, NP, Jacolyn Reedy, PA-C, Eligha Bridegroom, NP, Tereso Newcomer, PA-C, or Cyndi Bender, NP     Then, [name], MD will plan to see you again in 1 year(s).{  Other Instructions You will need lab work at your next visit as well.  Important Information About Sugar

## 2021-12-08 ENCOUNTER — Telehealth: Payer: Self-pay | Admitting: Cardiovascular Disease

## 2021-12-08 MED ORDER — MAGNESIUM OXIDE 400 MG PO TABS: 400.0000 mg | ORAL_TABLET | Freq: Every day | ORAL | 3 refills | Status: DC

## 2021-12-08 NOTE — Telephone Encounter (Signed)
Pt's medication was sent to pt's pharmacy as requested. Confirmation received.  °

## 2021-12-08 NOTE — Telephone Encounter (Signed)
*  STAT* If patient is at the pharmacy, call can be transferred to refill team.   1. Which medications need to be refilled? (please list name of each medication and dose if known) magnesium oxide (MAG-OX) 400 (241.3 Mg) MG tablet  2. Which pharmacy/location (including street and city if local pharmacy) is medication to be sent to?  [id number] - [place], [place] - [address]  3. Do they need a 30 day or 90 day supply? 90

## 2021-12-09 ENCOUNTER — Other Ambulatory Visit: Payer: Self-pay

## 2021-12-09 MED ORDER — DOFETILIDE 500 MCG PO CAPS: 500.0000 ug | ORAL_CAPSULE | Freq: Two times a day (BID) | ORAL | 3 refills | Status: DC

## 2021-12-09 NOTE — Telephone Encounter (Signed)
Pt's medication was sent to pt's pharmacy as requested. Confirmation received.  °

## 2021-12-23 ENCOUNTER — Ambulatory Visit (HOSPITAL_BASED_OUTPATIENT_CLINIC_OR_DEPARTMENT_OTHER)
Admission: RE | Admit: 2021-12-23 | Discharge: 2021-12-23 | Disposition: A | Discharge status: Home / Self Care | Payer: BC Managed Care – PPO | Source: Ambulatory Visit | Attending: Cardiovascular Disease | Admitting: Cardiovascular Disease

## 2021-12-23 ENCOUNTER — Other Ambulatory Visit: Payer: Self-pay

## 2021-12-23 DIAGNOSIS — I480 Paroxysmal atrial fibrillation: Secondary | ICD-10-CM | POA: Insufficient documentation

## 2021-12-23 DIAGNOSIS — E782 Mixed hyperlipidemia: Secondary | ICD-10-CM | POA: Insufficient documentation

## 2021-12-23 MED ORDER — MAGNESIUM OXIDE 400 MG PO TABS: 400.0000 mg | ORAL_TABLET | Freq: Every day | ORAL | 3 refills | Status: DC

## 2021-12-25 ENCOUNTER — Telehealth: Payer: Self-pay

## 2021-12-25 DIAGNOSIS — R931 Abnormal findings on diagnostic imaging of heart and coronary circulation: Secondary | ICD-10-CM

## 2021-12-25 DIAGNOSIS — Z79899 Other long term (current) drug therapy: Secondary | ICD-10-CM

## 2021-12-25 DIAGNOSIS — E782 Mixed hyperlipidemia: Secondary | ICD-10-CM

## 2021-12-25 MED ORDER — ROSUVASTATIN CALCIUM 20 MG PO TABS: 20.0000 mg | ORAL_TABLET | Freq: Every day | ORAL | 3 refills | Status: DC

## 2021-12-25 NOTE — Telephone Encounter (Signed)
-----   Message from Vesta Mixer, MD sent at 12/24/2021  5:38 PM EDT ----- He is on Pravachol.  His last LDL is 95. Coronary artery calcium score is 121. His goal LDL is 55.  Please stop Pravachol Start rosuvastatin 20 mg a day. Check lipids, ALT in 3 months.

## 2021-12-25 NOTE — Telephone Encounter (Signed)
Spoke with patient who understands to switch from pravastatin to Rosuvastatin. He uses [facility] and will continue with pravastatin until he gets this in the mail. Labs have been ordered and scheduled for 04/03/22.

## 2021-12-30 ENCOUNTER — Other Ambulatory Visit: Payer: Self-pay

## 2021-12-30 ENCOUNTER — Other Ambulatory Visit: Payer: Self-pay | Admitting: *Deleted

## 2021-12-30 DIAGNOSIS — I4819 Other persistent atrial fibrillation: Secondary | ICD-10-CM

## 2021-12-30 MED ORDER — APIXABAN 5 MG PO TABS: 5.0000 mg | ORAL_TABLET | Freq: Two times a day (BID) | ORAL | 3 refills | Status: DC

## 2021-12-30 MED ORDER — LISINOPRIL 5 MG PO TABS: 5.0000 mg | ORAL_TABLET | Freq: Every day | ORAL | 3 refills | Status: DC

## 2021-12-30 MED ORDER — HYDRALAZINE HCL 25 MG PO TABS: ORAL_TABLET | ORAL | 3 refills | Status: DC

## 2021-12-30 NOTE — Addendum Note (Signed)
Addended by: Margaret Pyle D on: 12/30/2021 10:37 AM   Modules accepted: Orders

## 2021-12-30 NOTE — Telephone Encounter (Signed)
Eliquis 5mg  refill request received. Patient is 71 years old, weight-122.1kg, Crea-1.08 on 11/25/2021, Diagnosis-Afib, and last seen by Dr. 01/25/2022 on 11/25/2021. Dose is appropriate based on dosing criteria. Will send in refill to requested pharmacy.

## 2022-02-27 ENCOUNTER — Telehealth: Payer: Self-pay

## 2022-02-27 NOTE — Telephone Encounter (Signed)
[facility] pharmacy is requesting a prior auth on medication magnesium oxide, this medication is not covered by insurance, requesting a prior auth. Please address

## 2022-02-27 NOTE — Telephone Encounter (Signed)
**Note De-Identified Minette Manders Obfuscation** I attempted this Magnesium Oxide PA through covermymeds and received this message: Harold Barban (Key: Angeline Slim)  Outcome: Message from [facility]: Drug is not covered by plan Drug Magnesium Oxide 400MG  tablets Form [employer] Electronic PA Form (2017 NCPDP) CoverMyMeds Recommendation This medication may be excluded from the patient's benefit. For more information, please reach out to [facility] directly at [phone].  I then called [facility] as advised and s/w [place]. Per [phone] Magnesium Oxide is a complete exclusion on the pts list of covered medications because it is an OTC medication.  I called the pt to discuss and he verbalized understanding and thanked me for letting him know this outcome.  He stated that he would get from his local pharmacy.

## 2022-03-04 ENCOUNTER — Other Ambulatory Visit: Payer: Self-pay

## 2022-03-04 DIAGNOSIS — E782 Mixed hyperlipidemia: Secondary | ICD-10-CM

## 2022-03-04 DIAGNOSIS — Z79899 Other long term (current) drug therapy: Secondary | ICD-10-CM

## 2022-03-04 DIAGNOSIS — R931 Abnormal findings on diagnostic imaging of heart and coronary circulation: Secondary | ICD-10-CM

## 2022-03-04 MED ORDER — ROSUVASTATIN CALCIUM 20 MG PO TABS: 20.0000 mg | ORAL_TABLET | Freq: Every day | ORAL | 3 refills | Status: DC

## 2022-03-17 ENCOUNTER — Telehealth: Payer: Self-pay | Admitting: Cardiovascular Disease

## 2022-03-17 NOTE — Telephone Encounter (Signed)
Patient is calling wanting to know what sequence he should get all his recommended vaccines this year. He states he knows it's about 4 shots, but he does not want to get them all in one day so he is wanting to know how far to space it out. Please advise.

## 2022-03-18 NOTE — Telephone Encounter (Signed)
Returned call to patient and discussed that he should seek his PCP's advice on immunizations. Did advise that he wait 30 days between the immunizations to limit experiencing any possible side effects.

## 2022-03-24 NOTE — Telephone Encounter (Signed)
Pt is calling back in regards to a 3rd version of the covid immunization his pharmacist recommended and he would like to know what his cardiologist thought about this. The 3rd shot is called Novavax.

## 2022-03-25 NOTE — Telephone Encounter (Signed)
Returned call to patient to inform that Dr [name] stated that he still believes patient should consult with PCP regarding vaccine details, but he still doesn't feel strongly on recommending the vaccine.

## 2022-04-01 ENCOUNTER — Other Ambulatory Visit: Payer: Self-pay

## 2022-04-01 ENCOUNTER — Emergency Department (HOSPITAL_BASED_OUTPATIENT_CLINIC_OR_DEPARTMENT_OTHER)
Admission: EM | Admit: 2022-04-01 | Discharge: 2022-04-01 | Disposition: A | Discharge status: Home / Self Care | Payer: TRICARE For Life (TFL) | Attending: Emergency Medicine | Admitting: Emergency Medicine

## 2022-04-01 ENCOUNTER — Emergency Department (HOSPITAL_BASED_OUTPATIENT_CLINIC_OR_DEPARTMENT_OTHER): Payer: Medicare PPO

## 2022-04-01 DIAGNOSIS — S86911A Strain of unspecified muscle(s) and tendon(s) at lower leg level, right leg, initial encounter: Secondary | ICD-10-CM

## 2022-04-01 DIAGNOSIS — S161XXA Strain of muscle, fascia and tendon at neck level, initial encounter: Secondary | ICD-10-CM | POA: Diagnosis not present

## 2022-04-01 DIAGNOSIS — S86819A Strain of other muscle(s) and tendon(s) at lower leg level, unspecified leg, initial encounter: Secondary | ICD-10-CM | POA: Diagnosis not present

## 2022-04-01 DIAGNOSIS — R519 Headache, unspecified: Secondary | ICD-10-CM | POA: Diagnosis present

## 2022-04-01 DIAGNOSIS — W19XXXA Unspecified fall, initial encounter: Secondary | ICD-10-CM

## 2022-04-01 DIAGNOSIS — S060X0A Concussion without loss of consciousness, initial encounter: Secondary | ICD-10-CM

## 2022-04-01 DIAGNOSIS — Y92481 Parking lot as the place of occurrence of the external cause: Secondary | ICD-10-CM | POA: Insufficient documentation

## 2022-04-01 DIAGNOSIS — Z7901 Long term (current) use of anticoagulants: Secondary | ICD-10-CM | POA: Diagnosis not present

## 2022-04-01 DIAGNOSIS — W010XXA Fall on same level from slipping, tripping and stumbling without subsequent striking against object, initial encounter: Secondary | ICD-10-CM | POA: Insufficient documentation

## 2022-04-01 NOTE — ED Triage Notes (Signed)
Pt POV c/o mechanical fall today appx 1700, landed on nose/face, denies LOC. Takes Eliquis. Pt AOx4, clear speech.   Denies new confusion, denies blurred vision. PERRLA. Denies chest pain.   Also c/o right upper leg pain, neck pain.

## 2022-04-01 NOTE — Discharge Instructions (Signed)
You have neck pain, possibly from a cervical strain and/or pinched nerve.  ° °SEEK IMMEDIATE MEDICAL ATTENTION IF: °You develop difficulties swallowing or breathing.  °You have new or worse numbness, weakness, tingling, or movement problems in your arms or legs.  °You develop increasing pain which is uncontrolled with medications.  °You have change in bowel or bladder function, or other concerns. ° ° ° °

## 2022-04-01 NOTE — ED Provider Notes (Signed)
[facility] EMERGENCY DEPARTMENT Provider Note   CSN: [id number] Arrival date & time: 04/01/22  1955     History  Chief Complaint  Patient presents with   Ren Aspinall is a 71 y.o. male.  The history is provided by the patient and the spouse.  Patient with history of paroxysmal A-fib, on Eliquis presents after a mechanical fall.  Patient was visiting his daughter in [place] when he tripped over a raised area in the parking lot.  He reports striking his face and his head went backwards.  Denies any LOC.  He has mild pain to his face.  He has pain in his neck and right knee  No headache.  No vomiting.  He didn't roll over head over heels Patient had been at his baseline prior to the fall.  He otherwise felt well     Home Medications Prior to Admission medications   Medication Sig Start Date End Date Taking? Authorizing Provider  acetaminophen (TYLENOL) 500 MG tablet Take 1,000 mg by mouth every 8 (eight) hours as needed for mild pain or headache.    [provider]  apixaban (ELIQUIS) 5 MG TABS tablet Take 1 tablet (5 mg total) by mouth 2 (two) times daily. 12/30/21   Nahser, Deloris Ping, MD  diclofenac Sodium (VOLTAREN) 1 % GEL Apply 2 g topically every other day.    [provider]  dofetilide (TIKOSYN) 500 MCG capsule Take 1 capsule (500 mcg total) by mouth 2 (two) times daily. 12/09/21   Nahser, Deloris Ping, MD  guaiFENesin (MUCINEX) 600 MG 12 hr tablet Take 600 mg by mouth 2 (two) times daily as needed for cough or to loosen phlegm.    [provider]  hydrALAZINE (APRESOLINE) 25 MG tablet TAKE ONE HALF TABLET BY MOUTH THREE TIMES A DAY 12/30/21   Nahser, Deloris Ping, MD  lisinopril (ZESTRIL) 5 MG tablet Take 1 tablet (5 mg total) by mouth daily. 12/30/21   Nahser, Deloris Ping, MD  magnesium oxide (MAG-OX) 400 MG tablet Take 1 tablet (400 mg total) by mouth daily. 12/23/21   Nahser, Deloris Ping, MD  neomycin-bacitracin-polymyxin (NEOSPORIN)  ointment Apply 1 application topically daily as needed for wound care.    [provider]  PREVIDENT 5000 BOOSTER PLUS 1.1 % PSTE Use as directed 1 application in the mouth or throat daily.  08/09/19   [provider]  rosuvastatin (CRESTOR) 20 MG tablet Take 1 tablet (20 mg total) by mouth daily. 03/04/22   Nahser, Deloris Ping, MD      Allergies    Patient has no known allergies.    Review of Systems   Review of Systems  Constitutional:  Negative for fever.  Cardiovascular:  Negative for chest pain.  Gastrointestinal:  Negative for abdominal pain.  Musculoskeletal:  Positive for neck pain.  Neurological:  Positive for headaches.    Physical Exam Updated Vital Signs BP (!) 150/74   Pulse 64   Temp 98 F (36.7 C)   Resp 20   SpO2 100%  Physical Exam CONSTITUTIONAL: Elderly, no acute distress HEAD: Normocephalic/atraumatic EYES: EOMI/PERRL ENMT: Mucous membranes moist, mild tenderness to nose, no epistaxis, no obvious hematoma.  No deformities  no dental or oral trauma noted NECK: supple no meningeal signs SPINE/BACK:entire spine nontender Cervical paraspinal tenderness noted Mild lumbar paraspinal tenderness No bruising/crepitance/stepoffs noted to spine CV: S1/S2 noted, no murmurs/rubs/gallops noted, regular LUNGS: Lungs are clear to auscultation bilaterally, no apparent distress Chest-no  tenderness or crepitus ABDOMEN: soft, nontender NEURO: Pt is awake/alert/appropriate, moves all extremitiesx4.  No facial droop.  GCS 15.  Patient is ambulatory EXTREMITIES: pulses normal/equal, full ROM Mild tenderness to right knee, no deformities, no bruising. All other extremities/joints palpated/ranged and nontender SKIN: warm, color normal PSYCH: no abnormalities of mood noted, alert and oriented to situation  ED Results / Procedures / Treatments   Labs (all labs ordered are listed, but only abnormal results are displayed) Labs Reviewed - No data to  display  EKG None  Radiology CT Head Wo Contrast  Result Date: 04/01/2022 CLINICAL DATA:  Trauma.  Fall EXAM: CT HEAD WITHOUT CONTRAST CT CERVICAL SPINE WITHOUT CONTRAST TECHNIQUE: Multidetector CT imaging of the head and cervical spine was performed following the standard protocol without intravenous contrast. Multiplanar CT image reconstructions of the cervical spine were also generated. RADIATION DOSE REDUCTION: This exam was performed according to the departmental dose-optimization program which includes automated exposure control, adjustment of the mA and/or kV according to patient size and/or use of iterative reconstruction technique. COMPARISON:  None Available. FINDINGS: CT HEAD FINDINGS Brain: There is no mass, hemorrhage or extra-axial collection. The size and configuration of the ventricles and extra-axial CSF spaces are normal. The brain parenchyma is normal, without evidence of acute or chronic infarction. Vascular: No abnormal hyperdensity of the major intracranial arteries or dural venous sinuses. No intracranial atherosclerosis. Skull: The visualized skull base, calvarium and extracranial soft tissues are normal. Sinuses/Orbits: No fluid levels or advanced mucosal thickening of the visualized paranasal sinuses. No mastoid or middle ear effusion. The orbits are normal. CT CERVICAL SPINE FINDINGS Alignment: No static subluxation. Facets are aligned. Occipital condyles are normally positioned. Skull base and vertebrae: No acute fracture. Soft tissues and spinal canal: No prevertebral fluid or swelling. No visible canal hematoma. Disc levels: No advanced spinal canal or neural foraminal stenosis. Upper chest: No pneumothorax, pulmonary nodule or pleural effusion. Other: Normal visualized paraspinal cervical soft tissues. IMPRESSION: 1. No acute intracranial abnormality. 2. No acute fracture or static subluxation of the cervical spine. Electronically Signed   By: Deatra Robinson M.D.   On:  04/01/2022 21:04   CT CERVICAL SPINE WO CONTRAST  Result Date: 04/01/2022 CLINICAL DATA:  Trauma.  Fall EXAM: CT HEAD WITHOUT CONTRAST CT CERVICAL SPINE WITHOUT CONTRAST TECHNIQUE: Multidetector CT imaging of the head and cervical spine was performed following the standard protocol without intravenous contrast. Multiplanar CT image reconstructions of the cervical spine were also generated. RADIATION DOSE REDUCTION: This exam was performed according to the departmental dose-optimization program which includes automated exposure control, adjustment of the mA and/or kV according to patient size and/or use of iterative reconstruction technique. COMPARISON:  None Available. FINDINGS: CT HEAD FINDINGS Brain: There is no mass, hemorrhage or extra-axial collection. The size and configuration of the ventricles and extra-axial CSF spaces are normal. The brain parenchyma is normal, without evidence of acute or chronic infarction. Vascular: No abnormal hyperdensity of the major intracranial arteries or dural venous sinuses. No intracranial atherosclerosis. Skull: The visualized skull base, calvarium and extracranial soft tissues are normal. Sinuses/Orbits: No fluid levels or advanced mucosal thickening of the visualized paranasal sinuses. No mastoid or middle ear effusion. The orbits are normal. CT CERVICAL SPINE FINDINGS Alignment: No static subluxation. Facets are aligned. Occipital condyles are normally positioned. Skull base and vertebrae: No acute fracture. Soft tissues and spinal canal: No prevertebral fluid or swelling. No visible canal hematoma. Disc levels: No advanced spinal canal or neural foraminal stenosis.  Upper chest: No pneumothorax, pulmonary nodule or pleural effusion. Other: Normal visualized paraspinal cervical soft tissues. IMPRESSION: 1. No acute intracranial abnormality. 2. No acute fracture or static subluxation of the cervical spine. Electronically Signed   By: Deatra Robinson M.D.   On: 04/01/2022  21:04    Procedures Procedures    Medications Ordered in ED Medications - No data to display  ED Course/ Medical Decision Making/ A&P                           Medical Decision Making  This patient presents to the ED for concern of head injury, this involves an extensive number of treatment options, and is a complaint that carries with it a high risk of complications and morbidity.  The differential diagnosis includes but is not limited to concussion, subdural hematoma, skull fracture  Comorbidities that complicate the patient evaluation: Patient's presentation is complicated by their history of atrial fibrillation on anticoagulation  Additional history obtained: Additional history obtained from spouse   Imaging Studies ordered: I ordered imaging studies including CT scan head and C-spine   I independently visualized and interpreted imaging which showed no acute findings I agree with the radiologist interpretation   Complexity of problems addressed: Patient's presentation is most consistent with  acute presentation with potential threat to life or bodily function  Disposition: After consideration of the diagnostic results and the patient's response to treatment,  I feel that the patent would benefit from discharge   .    Patient presents after mechanical fall.  No acute traumatic injury noted.  Patient is requesting discharge home.  He declines x-ray of his right knee       Final Clinical Impression(s) / ED Diagnoses Final diagnoses:  Fall, initial encounter  Concussion without loss of consciousness, initial encounter  Strain of neck muscle, initial encounter  Knee strain, right, initial encounter    Rx / DC Orders ED Discharge Orders     None         Zadie Rhine, MD 04/01/22 2349

## 2022-04-03 ENCOUNTER — Ambulatory Visit: Payer: Medicare PPO | Attending: Cardiovascular Disease

## 2022-04-03 DIAGNOSIS — R931 Abnormal findings on diagnostic imaging of heart and coronary circulation: Secondary | ICD-10-CM

## 2022-04-03 DIAGNOSIS — Z79899 Other long term (current) drug therapy: Secondary | ICD-10-CM

## 2022-04-03 DIAGNOSIS — E782 Mixed hyperlipidemia: Secondary | ICD-10-CM

## 2022-04-03 LAB — LIPID PANEL
Chol/HDL Ratio: 2.2 ratio (ref 0.0–5.0)
Cholesterol, Total: 105 mg/dL (ref 100–199)
HDL: 48 mg/dL (ref >39)
LDL Chol Calc (NIH): 41 mg/dL (ref 0–99)
Triglycerides: 81 mg/dL (ref 0–149)
VLDL Cholesterol Cal: 16 mg/dL (ref 5–40)

## 2022-04-03 LAB — ALT: ALT: 17 IU/L (ref 0–44)

## 2022-04-27 ENCOUNTER — Telehealth: Payer: Self-pay | Admitting: Cardiovascular Disease

## 2022-04-27 NOTE — Telephone Encounter (Signed)
Pt c/o medication issue:  1. Name of Medication: Mucinex  2. How are you currently taking this medication (dosage and times per day)?   3. Are you having a reaction (difficulty breathing--STAT)?   4. What is your medication issue?  Patient wants to know what else can he take for his allergies, along with his other medicine and his condition?

## 2022-04-29 NOTE — Telephone Encounter (Signed)
Attempted to return call to patient to see what symptoms specifically he's wanting to treat, but no answer. Asked that he call back to provide more info, but advised I will send to PharmD for general recommendations on OTC sinus/allergy medications that would be safe with his current med list.

## 2022-04-29 NOTE — Telephone Encounter (Signed)
You want to avoid any product that says its "D" or "PE",  but DM (dextromethorphan) is ok to take for cough. The "D" means it has pseudoephedrine (Sudafed) in it and "PE" means it has phenylephrine in it. These both can increase your blood pressure and heart rate. You also want to avoid NSAID's like ibuprofen and naproxen. Tylenol is ok to take for pain/fever. You can try saline nasal rinses (such as a netti pot) for congestion along with Flonase or Atrovent. Cough drops are ok too for sore throat. Antihistamines like chlorpheniramine (can cause drowsiness) is good for runny nose/sneezing. Antihistamines like allegra and zyrtec are ok to take as well. These do not cause drowsiness typically. For a productive cough, can take generic or brand Mucinex. For a dry cough generic or brand Delsym (dextromethorphan).  

## 2022-04-29 NOTE — Telephone Encounter (Signed)
Again called patient, but no answer. Left detailed message reading Ruben [name]'s response (okay per DPR).

## 2022-05-25 ENCOUNTER — Telehealth: Payer: Self-pay | Admitting: Cardiovascular Disease

## 2022-05-25 NOTE — Telephone Encounter (Signed)
Return call to patient. Patient is scheduled for follow up with Dr Acie Fredrickson on 05/29/22 and wanted to know if he was scheduled for lab work prior to his appointment. Advised patient he is not currently scheduled for lab work.

## 2022-05-25 NOTE — Telephone Encounter (Signed)
Patient would like to know if he has to have blood testing prior to his visit on 2/9.

## 2022-05-28 ENCOUNTER — Encounter (HOSPITAL_COMMUNITY): Payer: Self-pay | Admitting: *Deleted

## 2022-05-28 ENCOUNTER — Encounter: Payer: Self-pay | Admitting: Cardiovascular Disease

## 2022-05-28 NOTE — Progress Notes (Signed)
Cardiology Office Note   Date:  05/29/2022   ID:  [name] [name] 12-Mar-1951, MRN [id number]  PCP:  Bartholome Bill, MD  Cardiologist:  previous Darlin Coco MD,  Mertie Moores, MD   Chief Complaint  Patient presents with   Atrial Fibrillation        Congestive Heart Failure        Problem list 1. 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  Atrial fibrillation 3. Obesity 4. Hyperlipidemia 5. Obstructive sleep apnea 6. Mitral valve prolapse - mild MR by echo Sept. 2014    [name] - notes from Spike Ekis is a 72 y.o. male who presents for Six-month follow-up visit     He has a past history of nonischemic dilated cardiomyopathy and associated atrial fibrillation at that time.  He was hospitalized in February 2012 and was critically ill with severe systolic left ventricular dysfunction and atrial fibrillation.  He was successfully cardioverted back to sinus rhythm in May 2012.  He did well until August 2014 when he went back into atrial fibrillation after we had cut back his amiodarone to just 100 mg daily.  His dose was increased and we cardioverted him successfully on 03/02/13.  Since then he has maintained normal sinus rhythm.  His echocardiogram in 01/14/13 showed that his ejection fraction had improved to 50-55%. Since last visit has had no new cardiac symptoms. He has a history of sleep apnea and uses a CPAP machine successfully.  The patient has a history of hypercholesterolemia.  He is not experiencing any myalgias. Recent lab work includes normal hepatic function and normal thyroid function  He has had a slight cough which is resolving on Mucinex.He has gained weight.  His family gave him a fit.bit  And he is trying to get more exercise to help him lose weight.  November 07, 2015:  Hx of a viral cardiomyopathy in 2011. Never got over a cold - progressive shortness of breath.  EF was 10% at one  point  EF h [name] [name] is doing well.  Seen for the first time today - transfer from [place]  Works for the state - [employer] .   No regular exercise .    No Cp ,  Breathing is good.  Jan. 16, 2018  Doing well. Has had a cold recently. Has not been exercising .   Jan.  25th, 2018:  [name] [name] is seen back today for a work in visit.  During his last visit we decreased his dose of amiodarone. He has gone back into atrial fibrillation.  He has been active,   This past Sunday , he noticed that he was more short of breath,  More fatigued.   Was seen in the hospital - found to be in atrial fib  He notices a faster HR on his Fit Bit.   Has not missed any doses of anticoagulant .    Has not tried any other antiarrhythmics.   12/23/2016: Doing well Breathing is going well.   Has fatigue in the evening  Had Afib earlier this year, Started on Tikosyn 500 BID  Labs last week  Potassium = 4.4 No mag level was drawn  Echo from Feb. 2018 shows normal LV EF   Aug 18, 2017:   [name] [name] is seen today for follow-up of his atrial fibrillation and chronic systolic congestive heart failure. His A. fib has been well controlled on Tikosyn. Had an episode  of AF after shoveling snow.  Lasted for a day or so   February 16, 2018: Is here to follow-up with his atrial fibrillation and chronic systolic congestive heart failure.  His atrial fibrillation is well controlled on Tikosyn.  His magnesium and potassium levels were drawn several days ago and are normal.  Had a brief episode of PAF - lasted for a month and then went back into rhythm.  Has had a cough - has been taking mucinex  He thought he had some fluid retention. EF was normal .     August 01, 2018:   [name] [name] presents today for follow-up of his chronic systolic congestive heart failure and atrial fibrillation.    The  fraction was as low as 10% percent previously.   Recent echocardiogram from February, 2018 reveals an ejection fraction  of 55 to 60%. He has a history of atrial fibrillation.  He is controlled on amiodarone but due to the long-term side effects of amiodarone he was changed to Tikosyn in April, 2019.   He was last seen by Roderic Palau in the atrial fibrillation clinic in January, 2020.  He has had some recurrent episodes of atrial fibrillation.  He was successfully cardioverted on May 19, 2018.  He is on chronic Coumadin therapy.  Has not had any further episodes of AF since that time . No CP , no dyspnea,  No fever, cough.   He scheduled for follow-up to see today for an EKG, basic metabolic panel and magnesium levels.  Oct. 13, 2020   [name] [name] is see for follow up of his atrial fib and chronic systolic CHF No cp, no  Dyspnea, no presyncope  Gets some exercise  Needs to exercise more.    August 01, 2019: [name] [name] is seen today for follow-up of his chronic systolic and diastolic congestive heart failure, hyperlipidemia, atrial fibrillation.  He remains on Tikosyn.  His last echocardiogram revealed an ejection fraction of 55 to 60%.  We were unable to evaluate diastolic function.  He has mild mitral regurgitation.  The left atrium is moderately dilated.  HR is slow. Has not had any syncope  Breathing is good.  Avoids salt,   May eat some processed meats   Oct. 25, 2021:  [name] [name] is seen today for follow up of his  Chronic combined CHF  and AFib. He has mild MR. LA is moderately diated.  Has had some arrhythmias.  Became dizzy while driving.  Slightly light headed.  Went to [facility].  Replaced his mag.  Went to Afib clinic  Had cardioversion a month later.  Is on Tikosyn,  Is maintaining NSR  Potassium and magnesium levels were drawn recently and are normal.   Aug. 25, 2022: [name] [name] is seen today for CHf and Afib  Is on Tikosyn.  Will get bmp and mag level today .  His potassium level is low on July 14.    Aug. 7, 2023 [name] [name] is seen for follow up of his CHF and atrial fib Has changed from coumadin to  Eliquis  Able to do his normal activities  Mows the lawn  Needs more exercise   Hx of HLD  Found out the if he takes the lisinopril at night , he feels better  No arrhythmias   Feb. 9, 2024 [name] [name] is seen for follow up of his CHF and atrial fib  On eliquis , is eating greens and eating more healthy now  No CP or dyspnea Is on tikosyn Will check  mag and BMP today and in 6 months   Lipids in Dec.  2023 Chol = 105 HDL = 48 LDL is 41 Trigs 81 .   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 11/07/2015   Atrial fibrillation (HCC)    Cough    Non-ischemic cardiomyopathy (HCC)    OSA (obstructive sleep apnea)    Pure hypercholesterolemia 11/17/2010    Past Surgical History:  Procedure Laterality Date   CARDIOVERSION N/A 03/02/2013   Procedure: CARDIOVERSION;  Surgeon: Darlin Coco, MD;  Location: [place];  Service: Cardiovascular;  Laterality: N/A;   CARDIOVERSION N/A 08/05/2016   Procedure: CARDIOVERSION;  Surgeon: Skeet Latch, MD;  Location: [place];  Service: Cardiovascular;  Laterality: N/A;   CARDIOVERSION N/A 05/09/2018   Procedure: CARDIOVERSION;  Surgeon: Buford Dresser, MD;  Location: [place];  Service: Cardiovascular;  Laterality: N/A;   CARDIOVERSION N/A 01/09/2020   Procedure: CARDIOVERSION;  Surgeon: Skeet Latch, MD;  Location: [place];  Service: Cardiovascular;  Laterality: N/A;   FOOT FRACTURE SURGERY     PILONIDAL CYST EXCISION       Current Outpatient Medications  Medication Sig Dispense Refill   acetaminophen (TYLENOL) 500 MG tablet Take 1,000 mg by mouth every 8 (eight) hours as needed for mild pain or headache.     apixaban (ELIQUIS) 5 MG TABS tablet Take 1 tablet (5 mg total) by mouth 2 (two) times daily. 180 tablet 3   diclofenac Sodium (VOLTAREN) 1 % GEL Apply 2 g topically daily at 6 (six) AM.     dofetilide (TIKOSYN) 500 MCG capsule Take 1 capsule (500 mcg  total) by mouth 2 (two) times daily. 180 capsule 3   guaiFENesin (MUCINEX) 600 MG 12 hr tablet Take 600 mg by mouth 2 (two) times daily as needed for cough or to loosen phlegm.     hydrALAZINE (APRESOLINE) 25 MG tablet TAKE ONE HALF TABLET BY MOUTH THREE TIMES A DAY 135 tablet 3   lisinopril (ZESTRIL) 5 MG tablet Take 1 tablet (5 mg total) by mouth daily. 90 tablet 3   magnesium oxide (MAG-OX) 400 MG tablet Take 1 tablet (400 mg total) by mouth daily. 90 tablet 3   neomycin-bacitracin-polymyxin (NEOSPORIN) ointment Apply 1 application topically daily as needed for wound care.     PREVIDENT 5000 BOOSTER PLUS 1.1 % PSTE Use as directed 1 application in the mouth or throat daily.      rosuvastatin (CRESTOR) 20 MG tablet Take 1 tablet (20 mg total) by mouth daily. 90 tablet 3   No current facility-administered medications for this visit.    Allergies:   Patient has no known allergies.    Social History:  The patient  reports that he has never smoked. He has never used smokeless tobacco. He reports that he does not drink alcohol and does not use drugs.   Family History:  The patient's family history includes Cancer in his mother; Hypertension in his mother; Memory loss in his mother; Prostate cancer in his father; Stroke in his mother.    ROS:  Please see the history of present illness.   Otherwise, review of systems are positive for none.   All other systems are reviewed and negative.    Physical Exam: Blood pressure 116/66, pulse (!) 58, height 6' 4"$  (1.93 m), weight 259 lb (117.5 kg), SpO2 95 %.       GEN:  Well nourished, well developed in no acute distress HEENT: Normal  NECK: No JVD; No carotid bruits LYMPHATICS: No lymphadenopathy CARDIAC: RRR , no murmurs, rubs, gallops RESPIRATORY:  Clear to auscultation without rales, wheezing or rhonchi  ABDOMEN: Soft, non-tender, non-distended MUSCULOSKELETAL:  No edema; No deformity  SKIN: Warm and dry NEUROLOGIC:  Alert and oriented x  3  EKG:       Feb. 9, 2024.  Sinus brady at 58.  No ST or T wave changes.    Recent Labs: 11/25/2021: BUN 12; Creatinine, Ser 1.08; Magnesium 2.0; Potassium 4.6; Sodium 139 04/03/2022: ALT 17    Lipid Panel    Component Value Date/Time   CHOL 105 04/03/2022 0834   TRIG 81 04/03/2022 0834   HDL 48 04/03/2022 0834   CHOLHDL 2.2 04/03/2022 0834   CHOLHDL 3.4 11/05/2015 0746   VLDL 31 (H) 11/05/2015 0746   LDLCALC 41 04/03/2022 0834   LDLDIRECT 90.2 12/30/2012 0740      Wt Readings from Last 3 Encounters:  05/29/22 259 lb (117.5 kg)  11/25/21 269 lb 3.2 oz (122.1 kg)  06/03/21 271 lb 3.2 oz (123 kg)      ASSESSMENT AND PLAN:  1.  Chronic combined S/D  CHF :  normal LV function now .    Cont meds.    2. Persistant  atrial fibrillation,   s/p CV.  Maintaining NSR currently  Cont eliquis       3. Dyslipidemia.   Lipids in Dec.  2023 Chol = 105 HDL = 48 LDL is 41 Trigs 81 .   On rosuvastatin 20 mg a day     4.    Hypertension:  BP is well controlled.      Mertie Moores, MD  05/29/2022 9:59 AM    [place],  [place], [place]  [phone] Pager [phone] Phone: [phone]; Fax: [phone]

## 2022-05-29 ENCOUNTER — Ambulatory Visit: Payer: Medicare HMO | Attending: Cardiovascular Disease | Admitting: Cardiovascular Disease

## 2022-05-29 ENCOUNTER — Encounter: Payer: Self-pay | Admitting: Cardiovascular Disease

## 2022-05-29 VITALS — BP 116/66 | HR 58 | Ht 76.0 in | Wt 259.0 lb

## 2022-05-29 DIAGNOSIS — I480 Paroxysmal atrial fibrillation: Secondary | ICD-10-CM

## 2022-05-29 DIAGNOSIS — Z5181 Encounter for therapeutic drug level monitoring: Secondary | ICD-10-CM

## 2022-05-29 DIAGNOSIS — I5032 Chronic diastolic (congestive) heart failure: Secondary | ICD-10-CM | POA: Diagnosis not present

## 2022-05-29 DIAGNOSIS — E782 Mixed hyperlipidemia: Secondary | ICD-10-CM | POA: Diagnosis not present

## 2022-05-29 NOTE — Patient Instructions (Signed)
Medication Instructions:  Your physician recommends that you continue on your current medications as directed. Please refer to the Current Medication list given to you today.  *If you need a refill on your cardiac medications before your next appointment, please call your pharmacy*   Lab Work: Magnesium Level, BMET today Repeat Mag and BMET in 6 months Lipids, ALT in 1 year (prior to visit) If you have labs (blood work) drawn today and your tests are completely normal, you will receive your results only by: [facility] (if you have MyChart) OR A paper copy in the mail If you have any lab test that is abnormal or we need to change your treatment, we will call you to review the results.   Testing/Procedures: NONE   Follow-Up: At [facility], you and your health needs are our priority.  As part of our continuing mission to provide you with exceptional heart care, we have created designated Provider Care Teams.  These Care Teams include your primary Cardiologist (physician) and Advanced Practice Providers (APPs -  Physician Assistants and Nurse Practitioners) who all work together to provide you with the care you need, when you need it.  We recommend signing up for the patient portal called "MyChart".  Sign up information is provided on this After Visit Summary.  MyChart is used to connect with patients for Virtual Visits (Telemedicine).  Patients are able to view lab/test results, encounter notes, upcoming appointments, etc.  Non-urgent messages can be sent to your provider as well.   To learn more about what you can do with MyChart, go to [email or web address].    Your next appointment:   1 year(s)  Provider:   Mertie Moores, MD

## 2022-05-30 LAB — BASIC METABOLIC PANEL
BUN/Creatinine Ratio: 14 (ref 10–24)
BUN: 14 mg/dL (ref 8–27)
CO2: 24 mmol/L (ref 20–29)
Calcium: 9.1 mg/dL (ref 8.6–10.2)
Chloride: 103 mmol/L (ref 96–106)
Creatinine, Ser: 1.03 mg/dL (ref 0.76–1.27)
Glucose: 102 mg/dL — ABNORMAL HIGH (ref 70–99)
Potassium: 4.3 mmol/L (ref 3.5–5.2)
Sodium: 140 mmol/L (ref 134–144)
eGFR: 78 mL/min/{1.73_m2} (ref >59)

## 2022-05-30 LAB — MAGNESIUM: Magnesium: 2.1 mg/dL (ref 1.6–2.3)

## 2022-06-03 ENCOUNTER — Other Ambulatory Visit: Payer: Self-pay

## 2022-06-03 MED ORDER — LISINOPRIL 5 MG PO TABS: 5.0000 mg | ORAL_TABLET | Freq: Every day | ORAL | 3 refills | Status: DC

## 2022-06-18 ENCOUNTER — Emergency Department (HOSPITAL_BASED_OUTPATIENT_CLINIC_OR_DEPARTMENT_OTHER)
Admission: EM | Admit: 2022-06-18 | Discharge: 2022-06-18 | Disposition: A | Discharge status: Home / Self Care | Payer: Medicare HMO | Attending: Emergency Medicine | Admitting: Emergency Medicine

## 2022-06-18 ENCOUNTER — Encounter (HOSPITAL_BASED_OUTPATIENT_CLINIC_OR_DEPARTMENT_OTHER): Payer: Self-pay | Admitting: Emergency Medicine

## 2022-06-18 ENCOUNTER — Emergency Department (HOSPITAL_BASED_OUTPATIENT_CLINIC_OR_DEPARTMENT_OTHER): Payer: Medicare HMO

## 2022-06-18 ENCOUNTER — Other Ambulatory Visit: Payer: Self-pay

## 2022-06-18 DIAGNOSIS — W010XXA Fall on same level from slipping, tripping and stumbling without subsequent striking against object, initial encounter: Secondary | ICD-10-CM | POA: Diagnosis not present

## 2022-06-18 DIAGNOSIS — Y9301 Activity, walking, marching and hiking: Secondary | ICD-10-CM | POA: Diagnosis not present

## 2022-06-18 DIAGNOSIS — W19XXXA Unspecified fall, initial encounter: Secondary | ICD-10-CM

## 2022-06-18 DIAGNOSIS — S8992XA Unspecified injury of left lower leg, initial encounter: Secondary | ICD-10-CM | POA: Diagnosis present

## 2022-06-18 DIAGNOSIS — Z7901 Long term (current) use of anticoagulants: Secondary | ICD-10-CM | POA: Diagnosis not present

## 2022-06-18 MED ORDER — DICLOFENAC SODIUM 1 % EX GEL: 4.0000 g | Freq: Four times a day (QID) | CUTANEOUS | 0 refills | Status: AC

## 2022-06-18 NOTE — ED Provider Notes (Signed)
EMERGENCY DEPARTMENT AT [facility] Provider Note   CSN: [id number] Arrival date & time: 06/18/22  [id number]     History  Chief Complaint  Patient presents with   Lytle Michaels    Ruben [name] is a 72 y.o. male.  72 yo M with a chief complaints of left knee and foot pain.  He said he was walking and he slipped in the mud and ended up falling with his left leg underneath of him.  Complaining of pain to the left knee left foot and left ankle.  He has been able to ambulate on it.  He has had trouble with that left knee previously.   Fall       Home Medications Prior to Admission medications   Medication Sig Start Date End Date Taking? Authorizing Provider  acetaminophen (TYLENOL) 500 MG tablet Take 1,000 mg by mouth every 8 (eight) hours as needed for mild pain or headache.    [provider]  apixaban (ELIQUIS) 5 MG TABS tablet Take 1 tablet (5 mg total) by mouth 2 (two) times daily. 12/30/21   Nahser, Wonda Cheng, MD  diclofenac Sodium (VOLTAREN) 1 % GEL Apply 4 g topically 4 (four) times daily. 06/18/22   Deno Etienne, DO  dofetilide (TIKOSYN) 500 MCG capsule Take 1 capsule (500 mcg total) by mouth 2 (two) times daily. 12/09/21   Nahser, Wonda Cheng, MD  guaiFENesin (MUCINEX) 600 MG 12 hr tablet Take 600 mg by mouth 2 (two) times daily as needed for cough or to loosen phlegm.    [provider]  hydrALAZINE (APRESOLINE) 25 MG tablet TAKE ONE HALF TABLET BY MOUTH THREE TIMES A DAY 12/30/21   Nahser, Wonda Cheng, MD  lisinopril (ZESTRIL) 5 MG tablet Take 1 tablet (5 mg total) by mouth daily. 06/03/22   Nahser, Wonda Cheng, MD  magnesium oxide (MAG-OX) 400 MG tablet Take 1 tablet (400 mg total) by mouth daily. 12/23/21   Nahser, Wonda Cheng, MD  neomycin-bacitracin-polymyxin (NEOSPORIN) ointment Apply 1 application topically daily as needed for wound care.    [provider]  PREVIDENT 5000 BOOSTER PLUS 1.1 % PSTE Use as directed 1 application in the mouth or  throat daily.  08/09/19   [provider]  rosuvastatin (CRESTOR) 20 MG tablet Take 1 tablet (20 mg total) by mouth daily. 03/04/22   Nahser, Wonda Cheng, MD      Allergies    Patient has no known allergies.    Review of Systems   Review of Systems  Physical Exam Updated Vital Signs BP (!) 146/76   Pulse 81   Temp 97.8 F (36.6 C) (Oral)   Resp 18   SpO2 96%  Physical Exam Vitals and nursing note reviewed.  Constitutional:      Appearance: He is well-developed.  HENT:     Head: Normocephalic and atraumatic.  Eyes:     Pupils: Pupils are equal, round, and reactive to light.  Neck:     Vascular: No JVD.  Cardiovascular:     Rate and Rhythm: Normal rate and regular rhythm.     Heart sounds: No murmur heard.    No friction rub. No gallop.  Pulmonary:     Effort: No respiratory distress.     Breath sounds: No wheezing.  Abdominal:     General: There is no distension.     Tenderness: There is no abdominal tenderness. There is no guarding or rebound.  Musculoskeletal:  General: Normal range of motion.     Cervical back: Normal range of motion and neck supple.     Comments: Pain and mild swelling to the left knee.  Pain with range of motion which limits exam.  No obvious ligamentous laxity.  He has some pain along the medial and lateral malleolus worse on the lateral aspect send pain with attachment of the ATF to the foot.  He has some mild tenderness about the base of the fifth metatarsal as well.  Pulse motor and sensation intact.  Patient was palpated from head to toe without any other noted areas of bony tenderness.  Skin:    Coloration: Skin is not pale.     Findings: No rash.  Neurological:     Mental Status: He is alert and oriented to person, place, and time.  Psychiatric:        Behavior: Behavior normal.     ED Results / Procedures / Treatments   Labs (all labs ordered are listed, but only abnormal results are displayed) Labs Reviewed - No data to  display  EKG None  Radiology DG Ankle Complete Left  Result Date: 06/18/2022 CLINICAL DATA:  Ankle pain with swelling and limited range of motion. Fell last night. EXAM: LEFT ANKLE COMPLETE - 3+ VIEW COMPARISON:  None Available. FINDINGS: No acute fracture or subluxation. Plantar calcaneal spur and calcification along the plantar fascitis consistent with chronic plantar fascitis. There is irregularity of the interosseous membrane consistent with remote trauma. IMPRESSION: 1. No acute fracture or subluxation. 2. Findings consistent with chronic plantar fasciitis. Electronically Signed   By: Nolon Nations M.D.   On: 06/18/2022 11:12   DG Knee Complete 4 Views Left  Result Date: 06/18/2022 CLINICAL DATA:  Injury. LEFT knee pain with swelling and limited range of motion. Patient fell last night. EXAM: LEFT KNEE - COMPLETE 4+ VIEW COMPARISON:  None Available. FINDINGS: There is degenerative change involving the MEDIAL, LATERAL, and patellofemoral compartments. Chondrocalcinosis is present. Moderate joint effusion is noted. There is no acute fracture or subluxation. Scattered arterial calcifications. IMPRESSION: 1. Degenerative changes. 2. Moderate joint effusion. 3. No evidence for acute fracture. Electronically Signed   By: Nolon Nations M.D.   On: 06/18/2022 11:11   DG Foot Complete Left  Result Date: 06/18/2022 CLINICAL DATA:  Injury. LEFT foot pain with swelling and limited range of motion. Patient fell last night. EXAM: LEFT FOOT - COMPLETE 3+ VIEW COMPARISON:  None Available. FINDINGS: There is no evidence of fracture or dislocation. Soft tissues are unremarkable. There are scattered arterial calcifications. There is calcification along the plantar fascia consistent with chronic fasciitis. Moderate plantar spur present. IMPRESSION: 1. No acute fracture or dislocation. 2. Chronic plantar fasciitis. 3. Scattered arterial calcifications. Electronically Signed   By: Nolon Nations M.D.   On:  06/18/2022 11:09    Procedures Procedures    Medications Ordered in ED Medications - No data to display  ED Course/ Medical Decision Making/ A&P                             Medical Decision Making Amount and/or Complexity of Data Reviewed Radiology: ordered.   72 yo M with a chief complaints of left leg pain after a fall.  Nonsyncopal by history.  Plain film of the knee independently turbid by me without fracture.  Plain film of the left ankle independently interpreted by me without fracture or dislocation.  Plain film  of the left foot independently interpreted by me without fracture.  Patient reassessed.  He tells me he has a knee sleeve at home.  He would like a splint for his ankle.  Placed in an ASO.  Given sports medicine and orthopedic follow-up.  1:16 PM:  I have discussed the diagnosis/risks/treatment options with the patient.  Evaluation and diagnostic testing in the emergency department does not suggest an emergent condition requiring admission or immediate intervention beyond what has been performed at this time.  They will follow up with ortho, sports med. We also discussed returning to the ED immediately if new or worsening sx occur. We discussed the sx which are most concerning (e.g., sudden worsening pain, fever, inability to tolerate by mouth) that necessitate immediate return. Medications administered to the patient during their visit and any new prescriptions provided to the patient are listed below.  Medications given during this visit Medications - No data to display   The patient appears reasonably screen and/or stabilized for discharge and I doubt any other medical condition or other [facility] requiring further screening, evaluation, or treatment in the ED at this time prior to discharge.          Final Clinical Impression(s) / ED Diagnoses Final diagnoses:  Fall, initial encounter  Injury of left knee, initial encounter    Rx / DC Orders ED Discharge Orders           Ordered    diclofenac Sodium (VOLTAREN) 1 % GEL  4 times daily        06/18/22 1129              Deno Etienne, DO 06/18/22 1316

## 2022-06-18 NOTE — Discharge Instructions (Addendum)
Use the gel as prescribed Also take tylenol '1000mg'$ (2 extra strength) four times a day.   Follow up with ortho or sports med

## 2022-06-18 NOTE — ED Triage Notes (Addendum)
Fell yesterday , left knee pain and left foot pain . On Eliquis , no head injury

## 2022-11-04 ENCOUNTER — Other Ambulatory Visit: Payer: Self-pay | Admitting: Cardiovascular Disease

## 2022-11-04 MED ORDER — MAGNESIUM OXIDE 400 MG PO TABS: 400.0000 mg | ORAL_TABLET | Freq: Every day | ORAL | 2 refills | Status: DC

## 2022-11-06 ENCOUNTER — Other Ambulatory Visit: Payer: Self-pay

## 2022-11-06 MED ORDER — DOFETILIDE 500 MCG PO CAPS: 500.0000 ug | ORAL_CAPSULE | Freq: Two times a day (BID) | ORAL | 2 refills | Status: DC

## 2022-11-07 ENCOUNTER — Other Ambulatory Visit: Payer: Self-pay | Admitting: Cardiovascular Disease

## 2022-11-09 ENCOUNTER — Other Ambulatory Visit: Payer: Self-pay | Admitting: Cardiovascular Disease

## 2022-11-10 ENCOUNTER — Other Ambulatory Visit: Payer: Self-pay

## 2022-11-10 MED ORDER — DOFETILIDE 500 MCG PO CAPS: 500.0000 ug | ORAL_CAPSULE | Freq: Two times a day (BID) | ORAL | 2 refills | Status: DC

## 2022-11-16 ENCOUNTER — Telehealth: Payer: Self-pay | Admitting: Cardiovascular Disease

## 2022-11-16 ENCOUNTER — Other Ambulatory Visit: Payer: Self-pay | Admitting: Cardiovascular Disease

## 2022-11-16 DIAGNOSIS — I4819 Other persistent atrial fibrillation: Secondary | ICD-10-CM

## 2022-11-16 NOTE — Telephone Encounter (Signed)
Prescription refill request for Eliquis received. Indication:afib Last office visit:2/24 Scr:1.02  4/24 Age: 72 Weight:117.5  kg  Prescription refilled

## 2022-11-16 NOTE — Telephone Encounter (Signed)
Patient calling to see if he suppose to have EKG schedule for this year. Please advise

## 2022-11-17 NOTE — Telephone Encounter (Signed)
Returned call to patient and scheduled for nurse visit to do 6 month EKG for Tikosyn monitoring per NA.

## 2022-11-25 ENCOUNTER — Ambulatory Visit: Payer: Medicare HMO

## 2022-11-25 VITALS — HR 53 | Ht 76.0 in | Wt 255.6 lb

## 2022-11-25 DIAGNOSIS — I480 Paroxysmal atrial fibrillation: Secondary | ICD-10-CM

## 2022-11-25 DIAGNOSIS — Z5181 Encounter for therapeutic drug level monitoring: Secondary | ICD-10-CM

## 2022-11-25 DIAGNOSIS — I5032 Chronic diastolic (congestive) heart failure: Secondary | ICD-10-CM

## 2022-11-25 DIAGNOSIS — E782 Mixed hyperlipidemia: Secondary | ICD-10-CM

## 2022-11-25 NOTE — Progress Notes (Unsigned)
   Nurse Visit   Date of Encounter: 11/25/2022 ID: Ruben [name], DOB January 24, 1951, MRN [id number]  PCP:  Verlon Au, MD   [facility] Providers Cardiologist:  [name], MD Cardiology APP:  Beatrice Lecher, PA-C      Visit Details   VS:  There were no vitals taken for this visit. , BMI There is no height or weight on file to calculate BMI.  Wt Readings from Last 3 Encounters:  05/29/22 259 lb (117.5 kg)  11/25/21 269 lb 3.2 oz (122.1 kg)  06/03/21 271 lb 3.2 oz (123 kg)    EKG Interpretation Date/Time:  Wednesday November 25 2022 15:07:10 EDT Ventricular Rate:  53 PR Interval:  164 QRS Duration:  106 QT Interval:  474 QTC Calculation: 444 R Axis:   -31  Text Interpretation: Sinus bradycardia with occasional Premature ventricular complexes Left axis deviation Incomplete left bundle branch block When compared with ECG of 31-Oct-2020 20:32, Vent. rate has decreased BY  28 BPM QT has shortened prior QTc 459 ms Confirmed by Donato Schultz ([id number]) on 11/25/2022 3:32:16 PM    Reason for visit: EKG Dofetilide  Performed today: Vitals, EKG, Provider consulted:Dr. Anne Fu, and Education Changes (medications, testing, etc.) : NONE Length of Visit: 20 minutes    Medications Adjustments/Labs and Tests Ordered: Orders Placed This Encounter  Procedures   EKG 12-Lead   No orders of the defined types were placed in this encounter.    Signed, Macie Burows, RN  11/25/2022 2:54 PM

## 2022-11-25 NOTE — Patient Instructions (Signed)
Medication Instructions:  Your physician recommends that you continue on your current medications as directed. Please refer to the Current Medication list given to you today.  *If you need a refill on your cardiac medications before your next appointment, please call your pharmacy*   Lab Work: NONE If you have labs (blood work) drawn today and your tests are completely normal, you will receive your results only by: MyChart Message (if you have MyChart) OR A paper copy in the mail If you have any lab test that is abnormal or we need to change your treatment, we will call you to review the results.   Testing/Procedures: NONE   Follow-Up:As scheduled At [facility], you and your health needs are our priority.  As part of our continuing mission to provide you with exceptional heart care, we have created designated Provider Care Teams.  These Care Teams include your primary Cardiologist (physician) and Advanced Practice Providers (APPs -  Physician Assistants and Nurse Practitioners) who all work together to provide you with the care you need, when you need it.  We recommend signing up for the patient portal called "MyChart".  Sign up information is provided on this After Visit Summary.  MyChart is used to connect with patients for Virtual Visits (Telemedicine).  Patients are able to view lab/test results, encounter notes, upcoming appointments, etc.  Non-urgent messages can be sent to your provider as well.   To learn more about what you can do with MyChart, go to [email or web address].    Your next appointment:   Feb. 2025  Provider:   [name], MD

## 2022-11-27 ENCOUNTER — Ambulatory Visit: Payer: Medicare HMO

## 2023-01-11 ENCOUNTER — Other Ambulatory Visit: Payer: Self-pay | Admitting: Cardiovascular Disease

## 2023-03-02 ENCOUNTER — Telehealth: Payer: Self-pay | Admitting: Cardiovascular Disease

## 2023-03-02 NOTE — Telephone Encounter (Signed)
Patient called to ask about this year's covid shot to see if it is recommended

## 2023-03-04 NOTE — Telephone Encounter (Signed)
Pt called asking about RSV shot as well.

## 2023-03-04 NOTE — Telephone Encounter (Signed)
Returned call to patient and informed him that we aren't recommending the booster shots as they don't seem to impact the body defenses much anymore, but that it's a personal decision and if he feels he wants to get it or should, that it's completely up to him. Pt appreciative of call back.

## 2023-03-04 NOTE — Telephone Encounter (Signed)
Returned call to patient to inform that yes, RSV is recommended. He also asks about pneumonia shot and advised it too is recommended.

## 2023-04-21 ENCOUNTER — Other Ambulatory Visit: Payer: Self-pay | Admitting: Cardiovascular Disease

## 2023-04-21 DIAGNOSIS — R931 Abnormal findings on diagnostic imaging of heart and coronary circulation: Secondary | ICD-10-CM

## 2023-04-21 DIAGNOSIS — E782 Mixed hyperlipidemia: Secondary | ICD-10-CM

## 2023-04-21 DIAGNOSIS — Z79899 Other long term (current) drug therapy: Secondary | ICD-10-CM

## 2023-05-06 ENCOUNTER — Other Ambulatory Visit: Payer: Medicare HMO

## 2023-05-06 DIAGNOSIS — E782 Mixed hyperlipidemia: Secondary | ICD-10-CM

## 2023-05-06 DIAGNOSIS — I480 Paroxysmal atrial fibrillation: Secondary | ICD-10-CM

## 2023-05-06 DIAGNOSIS — I5032 Chronic diastolic (congestive) heart failure: Secondary | ICD-10-CM

## 2023-05-06 DIAGNOSIS — Z5181 Encounter for therapeutic drug level monitoring: Secondary | ICD-10-CM

## 2023-05-21 LAB — ALT: ALT: 14 [IU]/L (ref 0–44)

## 2023-05-21 LAB — LIPID PANEL
Chol/HDL Ratio: 2.5 {ratio} (ref 0.0–5.0)
Cholesterol, Total: 113 mg/dL (ref 100–199)
HDL: 45 mg/dL (ref >39)
LDL Chol Calc (NIH): 49 mg/dL (ref 0–99)
Triglycerides: 102 mg/dL (ref 0–149)
VLDL Cholesterol Cal: 19 mg/dL (ref 5–40)

## 2023-05-23 ENCOUNTER — Encounter: Payer: Self-pay | Admitting: Cardiovascular Disease

## 2023-05-23 NOTE — Progress Notes (Signed)
 Cardiology Office Note   Date:  05/25/2023   ID:  Ruben [name], DOB June 05, 1950, MRN [id number]  PCP:  Jolee Madelin Patch, MD  Cardiologist:  previous Debby Gallop MD,  Aleene Passe, MD   Chief Complaint  Patient presents with   Atrial Fibrillation        Congestive Heart Failure        Hyperlipidemia   Problem list 1. 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  Atrial fibrillation 3. Obesity 4. Hyperlipidemia 5. Obstructive sleep apnea 6. Mitral valve prolapse - mild MR by echo Sept. 2014    [name] - notes from Eligh Rybacki is a 73 y.o. male who presents for Six-month follow-up visit     He has a past history of nonischemic dilated cardiomyopathy and associated atrial fibrillation at that time.  He was hospitalized in February 2012 and was critically ill with severe systolic left ventricular dysfunction and atrial fibrillation.  He was successfully cardioverted back to sinus rhythm in May 2012.  He did well until August 2014 when he went back into atrial fibrillation after we had cut back his amiodarone  to just 100 mg daily.  His dose was increased and we cardioverted him successfully on 03/02/13.  Since then he has maintained normal sinus rhythm.  His echocardiogram in 01/14/13 showed that his ejection fraction had improved to 50-55%. Since last visit has had no new cardiac symptoms. He has a history of sleep apnea and uses a CPAP machine successfully.  The patient has a history of hypercholesterolemia.  He is not experiencing any myalgias. Recent lab work includes normal hepatic function and normal thyroid  function  He has had a slight cough which is resolving on Mucinex.He has gained weight.  His family gave him a fit.bit  And he is trying to get more exercise to help him lose weight.  November 07, 2015:  Hx of a viral cardiomyopathy in 2011. Never got over a cold - progressive shortness of breath.  EF  was 10% at one point  EF h Ruben [name] is doing well.  Seen for the first time today - transfer from [place]  Works for the state - [employer] .   No regular exercise .    No Cp ,  Breathing is good.  Jan. 16, 2018  Doing well. Has had a cold recently. Has not been exercising .   Jan.  25th, 2018:  Ruben [name] is seen back today for a work in visit.  During his last visit we decreased his dose of amiodarone . He has gone back into atrial fibrillation.  He has been active,   This past Sunday , he noticed that he was more short of breath,  More fatigued.   Was seen in the hospital - found to be in atrial fib  He notices a faster HR on his Fit Bit.   Has not missed any doses of anticoagulant .    Has not tried any other antiarrhythmics.   12/23/2016: Doing well Breathing is going well.   Has fatigue in the evening  Had Afib earlier this year, Started on Tikosyn  500 BID  Labs last week  Potassium = 4.4 No mag level was drawn  Echo from Feb. 2018 shows normal LV EF   Aug 18, 2017:   Ruben [name] is seen today for follow-up of his atrial fibrillation and chronic systolic congestive heart failure. His A. fib has been well controlled on Tikosyn .  Had an episode of AF after shoveling snow.  Lasted for a day or so   February 16, 2018: Is here to follow-up with his atrial fibrillation and chronic systolic congestive heart failure.  His atrial fibrillation is well controlled on Tikosyn .  His magnesium  and potassium levels were drawn several days ago and are normal.  Had a brief episode of PAF - lasted for a month and then went back into rhythm.  Has had a cough - has been taking mucinex  He thought he had some fluid retention. EF was normal .     August 01, 2018:   Ruben [name] presents today for follow-up of his chronic systolic congestive heart failure and atrial fibrillation.    The  fraction was as low as 10% percent previously.   Recent echocardiogram from February, 2018 reveals an  ejection fraction of 55 to 60%. He has a history of atrial fibrillation.  He is controlled on amiodarone  but due to the long-term side effects of amiodarone  he was changed to Tikosyn  in April, 2019.   He was last seen by Arland Don in the atrial fibrillation clinic in January, 2020.  He has had some recurrent episodes of atrial fibrillation.  He was successfully cardioverted on May 19, 2018.  He is on chronic Coumadin  therapy.  Has not had any further episodes of AF since that time . No CP , no dyspnea,  No fever, cough.   He scheduled for follow-up to see today for an EKG, basic metabolic panel and magnesium  levels.  Oct. 13, 2020   Ruben [name] is see for follow up of his atrial fib and chronic systolic CHF No cp, no  Dyspnea, no presyncope  Gets some exercise  Needs to exercise more.    August 01, 2019: Ruben [name] is seen today for follow-up of his chronic systolic and diastolic congestive heart failure, hyperlipidemia, atrial fibrillation.  He remains on Tikosyn .  His last echocardiogram revealed an ejection fraction of 55 to 60%.  We were unable to evaluate diastolic function.  He has mild mitral regurgitation.  The left atrium is moderately dilated.  HR is slow. Has not had any syncope  Breathing is good.  Avoids salt,   May eat some processed meats   Oct. 25, 2021:  Ruben [name] is seen today for follow up of his  Chronic combined CHF  and AFib. He has mild MR. LA is moderately diated.  Has had some arrhythmias.  Became dizzy while driving.  Slightly light headed.  Went to [facility].  Replaced his mag.  Went to Afib clinic  Had cardioversion a month later.  Is on Tikosyn ,  Is maintaining NSR  Potassium and magnesium  levels were drawn recently and are normal.   Aug. 25, 2022: Ruben [name] is seen today for CHf and Afib  Is on Tikosyn .  Will get bmp and mag level today .  His potassium level is low on July 14.    Aug. 7, 2023 Ruben [name] is seen for follow up of his CHF and atrial fib Has changed from  coumadin  to Eliquis   Able to do his normal activities  Mows the lawn  Needs more exercise   Hx of HLD  Found out the if he takes the lisinopril  at night , he feels better  No arrhythmias   Feb. 9, 2024 Ruben [name] is seen for follow up of his CHF and atrial fib  On eliquis  , is eating greens and eating more healthy now  No CP or dyspnea Is on  tikosyn  Will check mag and BMP today and in 6 months   Lipids in Dec.  2023 Chol = 105 HDL = 48 LDL is 41 Trigs 81 .   Feb. 4, 2025 Ruben [name] is seen for follow up of his CHF, atrial fib Doing well  Is on tikosyn    Exercising well .    His lipids from last week look great.  Total cholesterol is 113 Triglyceride levels 102 HDL is 45 LDL is 49         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 11/07/2015   Atrial fibrillation (HCC)    Cough    Non-ischemic cardiomyopathy (HCC)    OSA (obstructive sleep apnea)    Pure hypercholesterolemia 11/17/2010    Past Surgical History:  Procedure Laterality Date   CARDIOVERSION N/A 03/02/2013   Procedure: CARDIOVERSION;  Surgeon: Debby Gallop, MD;  Location: [facility] ENDOSCOPY;  Service: Cardiovascular;  Laterality: N/A;   CARDIOVERSION N/A 08/05/2016   Procedure: CARDIOVERSION;  Surgeon: Annabella Scarce, MD;  Location: [facility] ENDOSCOPY;  Service: Cardiovascular;  Laterality: N/A;   CARDIOVERSION N/A 05/09/2018   Procedure: CARDIOVERSION;  Surgeon: Lonni Slain, MD;  Location: [facility] ENDOSCOPY;  Service: Cardiovascular;  Laterality: N/A;   CARDIOVERSION N/A 01/09/2020   Procedure: CARDIOVERSION;  Surgeon: Scarce Annabella, MD;  Location: [facility] ENDOSCOPY;  Service: Cardiovascular;  Laterality: N/A;   FOOT FRACTURE SURGERY     PILONIDAL CYST EXCISION       Current Outpatient Medications  Medication Sig Dispense Refill   acetaminophen  (TYLENOL ) 500 MG tablet Take 1,000 mg by mouth every 8 (eight) hours as needed for mild pain or headache.      diclofenac  Sodium (VOLTAREN ) 1 % GEL Apply 4 g topically 4 (four) times daily. 350 g 0   dofetilide  (TIKOSYN ) 500 MCG capsule Take 1 capsule (500 mcg total) by mouth 2 (two) times daily. 180 capsule 2   ELIQUIS  5 MG TABS tablet TAKE 1 TABLET TWICE A DAY 180 tablet 3   guaiFENesin (MUCINEX) 600 MG 12 hr tablet Take 600 mg by mouth 2 (two) times daily as needed for cough or to loosen phlegm.     hydrALAZINE  (APRESOLINE ) 25 MG tablet TAKE ONE-HALF (1/2) TABLET THREE TIMES A DAY 135 tablet 3   lisinopril  (ZESTRIL ) 5 MG tablet Take 1 tablet (5 mg total) by mouth daily. 90 tablet 3   magnesium  oxide (MAG-OX) 400 MG tablet Take 1 tablet (400 mg total) by mouth daily. 90 tablet 2   neomycin-bacitracin-polymyxin (NEOSPORIN) ointment Apply 1 application topically daily as needed for wound care.     PREVIDENT 5000 BOOSTER PLUS 1.1 % PSTE Use as directed 1 application in the mouth or throat daily.      rosuvastatin  (CRESTOR ) 20 MG tablet Take 1 tablet (20 mg total) by mouth daily. Pt needs to keep upcoming appt in Feb for further refills 90 tablet 0   No current facility-administered medications for this visit.    Allergies:   Patient has no known allergies.    Social History:  The patient  reports that he has never smoked. He has never used smokeless tobacco. He reports that he does not drink alcohol  and does not use drugs.   Family History:  The patient's family history includes Cancer in his mother; Hypertension in his mother; Memory loss in his mother; Prostate cancer in his father; Stroke in his mother.    ROS:  Please see the history of present  illness.   Otherwise, review of systems are positive for none.   All other systems are reviewed and negative.     Physical Exam: Blood pressure 130/60, pulse (!) 50, height 6' 4 (1.93 m), weight 257 lb 12.8 oz (116.9 kg), SpO2 96%.       GEN:  Well nourished, well developed in no acute distress HEENT: Normal NECK: No JVD; No carotid  bruits LYMPHATICS: No lymphadenopathy CARDIAC: RRR , no murmurs, rubs, gallops RESPIRATORY:  Clear to auscultation without rales, wheezing or rhonchi  ABDOMEN: Soft, non-tender, non-distended MUSCULOSKELETAL:  No edema; No deformity  SKIN: Warm and dry NEUROLOGIC:  Alert and oriented x 3   EKG:         EKG Interpretation Date/Time:  Tuesday May 25 2023 09:26:59 EST Ventricular Rate:  46 PR Interval:  196 QRS Duration:  110 QT Interval:  478 QTC Calculation: 418 R Axis:   -3  Text Interpretation: Sinus bradycardia Low voltage QRS Cannot rule out Inferior infarct , age undetermined Cannot rule out Anterior infarct , age undetermined When compared with ECG of 25-Nov-2022 15:07, Premature ventricular complexes are no longer Present Confirmed by Alveta Mungo [phone]) on 05/25/2023 9:28:58 AM     Recent Labs: 11/25/2022: BUN 15; Creatinine, Ser 1.09; Magnesium  2.1; Potassium 4.5; Sodium 137 05/21/2023: ALT 14    Lipid Panel    Component Value Date/Time   CHOL 113 05/21/2023 0825   TRIG 102 05/21/2023 0825   HDL 45 05/21/2023 0825   CHOLHDL 2.5 05/21/2023 0825   CHOLHDL 3.4 11/05/2015 0746   VLDL 31 (H) 11/05/2015 0746   LDLCALC 49 05/21/2023 0825   LDLDIRECT 90.2 12/30/2012 0740      Wt Readings from Last 3 Encounters:  05/25/23 257 lb 12.8 oz (116.9 kg)  11/25/22 255 lb 9.6 oz (115.9 kg)  05/29/22 259 lb (117.5 kg)      ASSESSMENT AND PLAN:  1.  Chronic combined S/D  CHF :    LV function has normalized .  cont current meds.     2. Persistant  atrial fibrillation,    on tikosyn .  Is maintaining NSR  Needs BMP and mag level today .  QTc is normal     3. Dyslipidemia.   Lipids in Dec.  2023 Chol = 105 HDL = 48 LDL is 41 Trigs 81 .   On rosuvastatin  20 mg a day     4.    Hypertension:  BP is well controlled.      Mungo Alveta, MD  05/25/2023 11:14 AM    [facility] [address] [place],  [address] [place], [place]  [address] Pager  [phone] Phone: [phone]; Fax: [phone]

## 2023-05-25 ENCOUNTER — Ambulatory Visit: Payer: Medicare HMO | Attending: Cardiovascular Disease | Admitting: Cardiovascular Disease

## 2023-05-25 ENCOUNTER — Encounter: Payer: Self-pay | Admitting: Cardiovascular Disease

## 2023-05-25 VITALS — BP 130/60 | HR 50 | Ht 76.0 in | Wt 257.8 lb

## 2023-05-25 DIAGNOSIS — I10 Essential (primary) hypertension: Secondary | ICD-10-CM | POA: Diagnosis not present

## 2023-05-25 DIAGNOSIS — Z79899 Other long term (current) drug therapy: Secondary | ICD-10-CM

## 2023-05-25 DIAGNOSIS — I480 Paroxysmal atrial fibrillation: Secondary | ICD-10-CM | POA: Diagnosis not present

## 2023-05-25 LAB — BASIC METABOLIC PANEL
BUN/Creatinine Ratio: 19 (ref 10–24)
BUN: 20 mg/dL (ref 8–27)
CO2: 22 mmol/L (ref 20–29)
Calcium: 9 mg/dL (ref 8.6–10.2)
Chloride: 104 mmol/L (ref 96–106)
Creatinine, Ser: 1.05 mg/dL (ref 0.76–1.27)
Glucose: 97 mg/dL (ref 70–99)
Potassium: 4.3 mmol/L (ref 3.5–5.2)
Sodium: 140 mmol/L (ref 134–144)
eGFR: 75 mL/min/{1.73_m2} (ref >59)

## 2023-05-25 LAB — MAGNESIUM: Magnesium: 1.9 mg/dL (ref 1.6–2.3)

## 2023-05-25 NOTE — Patient Instructions (Addendum)
 Labs: BMET, Magnesium   Follow-Up: At [facility], you and your health needs are our priority.  As part of our continuing mission to provide you with exceptional heart care, we have created designated Provider Care Teams.  These Care Teams include your primary Cardiologist (physician) and Advanced Practice Providers (APPs -  Physician Assistants and Nurse Practitioners) who all work together to provide you with the care you need, when you need it.  We recommend signing up for the patient portal called MyChart.  Sign up information is provided on this After Visit Summary.  MyChart is used to connect with patients for Virtual Visits (Telemedicine).  Patients are able to view lab/test results, encounter notes, upcoming appointments, etc.  Non-urgent messages can be sent to your provider as well.   To learn more about what you can do with MyChart, go to [email or web address].    Your next appointment:   6 month(s)  Provider:   Provider  Other Instructions   1st Floor: - Lobby - Registration  - Pharmacy  - Lab - Cafe  2nd Floor: - PV Lab - Diagnostic Testing (echo, CT, nuclear med)  3rd Floor: - Vacant  4th Floor: - TCTS (cardiothoracic surgery) - AFib Clinic - Structural Heart Clinic - Vascular Surgery  - Vascular Ultrasound  5th Floor: - HeartCare Cardiology (general and EP) - Clinical Pharmacy for coumadin , hypertension, lipid, weight-loss medications, and med management appointments    Valet parking services will be available as well.

## 2023-05-28 ENCOUNTER — Telehealth: Payer: Self-pay

## 2023-05-28 MED ORDER — MAGNESIUM OXIDE 400 MG PO TABS: ORAL_TABLET | ORAL | Status: AC

## 2023-05-28 NOTE — Telephone Encounter (Signed)
-----   Message from Aleene Passe sent at 05/27/2023  9:21 PM EST ----- Potassium is 4.3 ( great  Magnesium  is slighlty low.  We would like for the mag level to be > 2.0 Ruben [name]'s Mag level is 1.9 Lets have him increase the max ox to 200 mg  ( 1/2 tablet) in the morning and 400 mg  in the evenings   Recheck mag level in 1 month

## 2023-05-28 NOTE — Telephone Encounter (Signed)
 Pt aware of plan to take 200 mg of Magnesium  Oxide in the morning and 400 mg in the evening. Prescription refilled.

## 2023-06-24 ENCOUNTER — Other Ambulatory Visit: Payer: Self-pay | Admitting: Cardiovascular Disease

## 2023-06-26 ENCOUNTER — Other Ambulatory Visit: Payer: Self-pay | Admitting: Cardiovascular Disease

## 2023-07-10 ENCOUNTER — Other Ambulatory Visit: Payer: Self-pay | Admitting: Cardiovascular Disease

## 2023-07-10 DIAGNOSIS — R931 Abnormal findings on diagnostic imaging of heart and coronary circulation: Secondary | ICD-10-CM

## 2023-07-10 DIAGNOSIS — E782 Mixed hyperlipidemia: Secondary | ICD-10-CM

## 2023-07-10 DIAGNOSIS — Z79899 Other long term (current) drug therapy: Secondary | ICD-10-CM

## 2023-07-12 NOTE — Telephone Encounter (Signed)
 This Pt is requesting a refill of this. Do you mind correcting to make it not OTC and sending in a year supply?

## 2023-09-28 ENCOUNTER — Telehealth: Payer: Self-pay | Admitting: Cardiovascular Disease

## 2023-09-28 DIAGNOSIS — Z79899 Other long term (current) drug therapy: Secondary | ICD-10-CM

## 2023-09-28 DIAGNOSIS — E782 Mixed hyperlipidemia: Secondary | ICD-10-CM

## 2023-09-28 NOTE — Telephone Encounter (Signed)
 Pt calling to request labs be put in so he can go prior to his Aug f/u appt

## 2023-09-28 NOTE — Telephone Encounter (Signed)
 Patient identification verified by 2 forms. Ruben Duck, RN     Called and spoke to patient  Patient states:  - Has labs drawn every . Unsure of which labs to order                Interventions/Plan: - Chart review shows BMET and Mag ordered at last OV.  - Encounter sent to gen card for review/recommendations.  - BMET/Mag ordered for patient to complete before August appt.   Patient agrees with plan, no questions at this time

## 2023-11-10 ENCOUNTER — Telehealth: Payer: Self-pay | Admitting: Physician Assistant

## 2023-11-10 NOTE — Telephone Encounter (Signed)
 Ok to take Delsym (dry cough) or Mucinex (wet cough). Avoid any of the D products.  If he thinks the cough is from post nasal drip he can use a saline spray followed by flonase nasal spray

## 2023-11-10 NOTE — Telephone Encounter (Signed)
 Pt asking what is safe from a cardiac standpoint, aside from Mucinex, for mild cough. Please advise.

## 2023-11-10 NOTE — Telephone Encounter (Signed)
 Pt c/o medication issue:  1. Name of Medication:  Mucinex  2. How are you currently taking this medication (dosage and times per day)?   3. Are you having a reaction (difficulty breathing--STAT)?   4. What is your medication issue?   Patient says he was previously advised by Dr. Alveta to take Mucinex from allergy/sinus symptoms. He would like to know if there is another OTC medication he can take with cardiac medication(s) for a mild cough. Please advise.

## 2023-11-10 NOTE — Telephone Encounter (Signed)
 Reached out to patient who states cough is dry, non-productive

## 2023-11-10 NOTE — Telephone Encounter (Signed)
 FYI---Spoke to pt, relayed feedback from RPH-CPP, Melissa. We discussed D products and post-nasal drip. Pt will follow this direction and he appreciates calls.

## 2023-11-18 ENCOUNTER — Other Ambulatory Visit: Payer: Self-pay

## 2023-11-18 DIAGNOSIS — E782 Mixed hyperlipidemia: Secondary | ICD-10-CM

## 2023-11-18 DIAGNOSIS — Z79899 Other long term (current) drug therapy: Secondary | ICD-10-CM

## 2023-11-19 ENCOUNTER — Ambulatory Visit: Payer: Self-pay

## 2023-11-19 LAB — BASIC METABOLIC PANEL WITH GFR
BUN/Creatinine Ratio: 16 (ref 10–24)
BUN: 16 mg/dL (ref 8–27)
CO2: 19 mmol/L — ABNORMAL LOW (ref 20–29)
Calcium: 8.9 mg/dL (ref 8.6–10.2)
Chloride: 102 mmol/L (ref 96–106)
Creatinine, Ser: 1.02 mg/dL (ref 0.76–1.27)
Glucose: 107 mg/dL — ABNORMAL HIGH (ref 70–99)
Potassium: 4.2 mmol/L (ref 3.5–5.2)
Sodium: 137 mmol/L (ref 134–144)
eGFR: 78 mL/min/1.73 (ref >59)

## 2023-11-19 LAB — MAGNESIUM: Magnesium: 2 mg/dL (ref 1.6–2.3)

## 2023-11-23 NOTE — Telephone Encounter (Signed)
Letter of results sent to pt  

## 2023-11-24 ENCOUNTER — Ambulatory Visit: Attending: Cardiology | Admitting: Physician Assistant

## 2023-11-24 ENCOUNTER — Encounter: Payer: Self-pay | Admitting: Physician Assistant

## 2023-11-24 VITALS — BP 110/68 | HR 66 | Ht 76.0 in | Wt 251.0 lb

## 2023-11-24 DIAGNOSIS — I4819 Other persistent atrial fibrillation: Secondary | ICD-10-CM | POA: Diagnosis not present

## 2023-11-24 DIAGNOSIS — Z79899 Other long term (current) drug therapy: Secondary | ICD-10-CM

## 2023-11-24 DIAGNOSIS — E785 Hyperlipidemia, unspecified: Secondary | ICD-10-CM | POA: Diagnosis not present

## 2023-11-24 DIAGNOSIS — I428 Other cardiomyopathies: Secondary | ICD-10-CM

## 2023-11-24 NOTE — Progress Notes (Signed)
 Cardiology Office Note   Date:  11/24/2023  ID:  Ruben [name], DOB [birth year], MRN [id number] PCP: Jolee Madelin Patch, MD  [facility] Providers Cardiologist:  Redell Shallow, MD Cardiology APP:  Lelon Glendia DASEN, PA-C     History of Present Illness Ruben [name] is a 73 y.o. male with PMH of nonischemic cardiomyopathy with improved EF, persistent atrial fibrillation, OSA on CPAP, and hyperlipidemia.  Patient was hospitalized in February 2012 and found to be in A-fib and has severely depressed EF.  He was successfully cardioverted back to sinus rhythm in May 2012.  He underwent repeat cardioversion in November 2014.  Echo at the time showed EF has improved to 50 to 55%.  He was started on Tikosyn  in September 2018.  Echocardiogram that year showed normal EF.  He did have a episode of A-fib in 2019 after shoveling snow, this is transient and only lasted a day.  Last heart monitor obtained in 2022 showed minimal heart rate of 38, maximal heart rate 231, average heart rate 66 bpm, no significant A-fib at that time.  PVC burden 7.4% last echocardiogram obtained on 11/20/2020 showed EF 50 to 55%, normal RV size, trivial MR, mildly dilated ascending aorta measuring 39 mm.  He has been followed by both Dr. Alveta and also A-fib clinic.  Patient was last seen by Dr. Alveta in February 2025 before his retirement.  He was doing well at the time.  Patient presents today for 21-month follow-up.  He denies any chest pain, palpitation, shortness of breath or lower extremity edema.  He has no orthopnea or PND.  EKG shows he is maintaining sinus rhythm with occasional PVCs.  He does have occasional transient episode of A-fib based on symptom, however no sustained arrhythmia.  He is currently on Eliquis  and dofetilide .  Will continue on the current therapy.  Since Dr. Alveta has retired, he will set up with Dr. Shallow.  He has recent blood work showed normal electrolyte.  Prior to his see Dr. Shallow  in 6 months, he will need a CBC, CMP and fasting lipid.  ROS:   He denies chest pain, palpitations, dyspnea, pnd, orthopnea, n, v, dizziness, syncope, edema, weight gain, or early satiety. All other systems reviewed and are otherwise negative except as noted above.    Studies Reviewed EKG Interpretation Date/Time:  Wednesday November 24 2023 08:04:55 EDT Ventricular Rate:  66 PR Interval:  178 QRS Duration:  108 QT Interval:  450 QTC Calculation: 471 R Axis:   -40  Text Interpretation: Sinus rhythm with occasional Premature ventricular complexes Left axis deviation Incomplete left bundle branch block When compared with ECG of 25-May-2023 09:26, Premature ventricular complexes are now Present Incomplete left bundle branch block is now Present QT has lengthened Confirmed by Alexzandra Bilton [phone]) on 11/24/2023 10:37:21 PM     Risk Assessment/Calculations  CHA2DS2-VASc Score = 3   This indicates a 3.2% annual risk of stroke. The patient's score is based upon: CHF History: 1 HTN History: 1 Diabetes History: 0 Stroke History: 0 Vascular Disease History: 0 Age Score: 1 Gender Score: 0            Physical Exam VS:  BP 110/68   Pulse 66   Ht 6' 4 (1.93 m)   Wt 251 lb (113.9 kg)   SpO2 96%   BMI 30.55 kg/m        Wt Readings from Last 3 Encounters:  11/24/23 251 lb (113.9 kg)  05/25/23 257  lb 12.8 oz (116.9 kg)  11/25/22 255 lb 9.6 oz (115.9 kg)    GEN: Well nourished, well developed in no acute distress NECK: No JVD; No carotid bruits CARDIAC: RRR, no murmurs, rubs, gallops RESPIRATORY:  Clear to auscultation without rales, wheezing or rhonchi  ABDOMEN: Soft, non-tender, non-distended EXTREMITIES:  No edema; No deformity   ASSESSMENT AND PLAN  Persistent atrial fibrillation: On Tikosyn .  Still occasionally has palpitation, however appears to be transient.  Continue Eliquis   Nonischemic cardiomyopathy: EF as low as 10% in 2012 when he was diagnosed with A-fib, since then,  ejection fraction has normalized.  He appears to be euvolemic on exam  Hyperlipidemia: On rosuvastatin  20 mg daily.       Dispo: Follow-up with Dr. Pietro in 6 months  Signed, Bellah Alia, [place]

## 2023-11-24 NOTE — Patient Instructions (Signed)
 Medication Instructions:  Your physician recommends that you continue on your current medications as directed. Please refer to the Current Medication list given to you today.  *If you need a refill on your cardiac medications before your next appointment, please call your pharmacy*  Lab Work: Fasting Lipid panel, CMET, CBC a few days before next appointment   Testing/Procedures: NONE ordered at this time of appointment   Follow-Up: At [facility], you and your health needs are our priority.  As part of our continuing mission to provide you with exceptional heart care, our providers are all part of one team.  This team includes your primary Cardiologist (physician) and Advanced Practice Providers or APPs (Physician Assistants and Nurse Practitioners) who all work together to provide you with the care you need, when you need it.  Your next appointment:   6 month(s)  Provider:   Dr. Pietro    We recommend signing up for the patient portal called MyChart.  Sign up information is provided on this After Visit Summary.  MyChart is used to connect with patients for Virtual Visits (Telemedicine).  Patients are able to view lab/test results, encounter notes, upcoming appointments, etc.  Non-urgent messages can be sent to your provider as well.   To learn more about what you can do with MyChart, go to [email or web address].

## 2023-11-30 ENCOUNTER — Other Ambulatory Visit: Payer: Self-pay

## 2023-11-30 DIAGNOSIS — I4819 Other persistent atrial fibrillation: Secondary | ICD-10-CM

## 2023-11-30 MED ORDER — APIXABAN 5 MG PO TABS: 5.0000 mg | ORAL_TABLET | Freq: Two times a day (BID) | ORAL | 3 refills | Status: AC

## 2023-11-30 NOTE — Telephone Encounter (Signed)
 Prescription refill request for Eliquis  received. Indication:AFIB Last office visit:8/25 Scr:1.02  7/25 Age: 72 Weight:113.9  KG  PRESCRIPTION REFILLED

## 2024-01-21 ENCOUNTER — Other Ambulatory Visit: Payer: Self-pay | Admitting: Physician Assistant

## 2024-01-21 MED ORDER — HYDRALAZINE HCL 25 MG PO TABS: ORAL_TABLET | ORAL | 3 refills | Status: AC

## 2024-05-16 NOTE — Progress Notes (Unsigned)
 "    HPI: Follow-up atrial fibrillation and CHF.  Previously followed by Dr. Alveta but transitioning to me.  Patient was hospitalized in 2012 with severe LV dysfunction (ejection fraction 10%) and atrial fibrillation.  He was cardioverted successfully with improvement.  He has been maintained on Tikosyn .  Most recent echocardiogram August 2022 showed ejection fraction 50 to 55%, mild left ventricular enlargement, moderate left atrial enlargement.  Monitor August 2022 showed sinus rhythm with brief episodes of nonsustained ventricular tachycardia longest 6 beats.  Calcium  score September 2023 121 which was 47 percentile, mildly dilated ascending aorta at 41 mm.  Since last seen  Current Outpatient Medications  Medication Sig Dispense Refill   acetaminophen  (TYLENOL ) 500 MG tablet Take 1,000 mg by mouth every 8 (eight) hours as needed for mild pain or headache.     apixaban  (ELIQUIS ) 5 MG TABS tablet Take 1 tablet (5 mg total) by mouth 2 (two) times daily. 180 tablet 3   diclofenac  Sodium (VOLTAREN ) 1 % GEL Apply 4 g topically 4 (four) times daily. 350 g 0   dofetilide  (TIKOSYN ) 500 MCG capsule TAKE 1 CAPSULE TWICE A DAY 180 capsule 3   guaiFENesin (MUCINEX) 600 MG 12 hr tablet Take 600 mg by mouth 2 (two) times daily as needed for cough or to loosen phlegm.     hydrALAZINE  (APRESOLINE ) 25 MG tablet TAKE ONE-HALF (1/2) TABLET THREE TIMES A DAY 135 tablet 3   lisinopril  (ZESTRIL ) 5 MG tablet TAKE 1 TABLET DAILY 90 tablet 3   magnesium  oxide (MAG-OX) 400 MG tablet 200 mg (1/2 tablet) in AM. 400 mg (1 tablet) in evening.     neomycin-bacitracin-polymyxin (NEOSPORIN) ointment Apply 1 application topically daily as needed for wound care.     PREVIDENT 5000 BOOSTER PLUS 1.1 % PSTE Use as directed 1 application in the mouth or throat daily.      rosuvastatin  (CRESTOR ) 20 MG tablet Take 1 tablet (20 mg total) by mouth daily. 90 tablet 3   No current facility-administered medications for this visit.      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 11/07/2015   Atrial fibrillation (HCC)    Cough    Non-ischemic cardiomyopathy (HCC)    OSA (obstructive sleep apnea)    Pure hypercholesterolemia 11/17/2010    Past Surgical History:  Procedure Laterality Date   CARDIOVERSION N/A 03/02/2013   Procedure: CARDIOVERSION;  Surgeon: Debby Gallop, MD;  Location: [facility] ENDOSCOPY;  Service: Cardiovascular;  Laterality: N/A;   CARDIOVERSION N/A 08/05/2016   Procedure: CARDIOVERSION;  Surgeon: Annabella Scarce, MD;  Location: [facility] ENDOSCOPY;  Service: Cardiovascular;  Laterality: N/A;   CARDIOVERSION N/A 05/09/2018   Procedure: CARDIOVERSION;  Surgeon: Lonni Slain, MD;  Location: [facility] ENDOSCOPY;  Service: Cardiovascular;  Laterality: N/A;   CARDIOVERSION N/A 01/09/2020   Procedure: CARDIOVERSION;  Surgeon: Scarce Annabella, MD;  Location: [facility] ENDOSCOPY;  Service: Cardiovascular;  Laterality: N/A;   FOOT FRACTURE SURGERY     PILONIDAL CYST EXCISION      Social History   Socioeconomic History   Marital status: Married    Spouse name: Not on file   Number of children: Not on file   Years of education: Not on file   Highest education level: Not on file  Occupational History   Not on file  Tobacco Use   Smoking status: Never   Smokeless tobacco: Never  Vaping Use   Vaping status: Never Used  Substance and Sexual Activity  Alcohol  use: No   Drug use: No   Sexual activity: Yes    Birth control/protection: Other-see comments  Other Topics Concern   Not on file  Social History Narrative   Not on file   Social Drivers of Health   Tobacco Use: Low Risk (11/24/2023)   Patient History    Smoking Tobacco Use: Never    Smokeless Tobacco Use: Never    Passive Exposure: Not on file  Financial Resource Strain: Not on file  Food Insecurity: Low Risk (08/16/2023)   Received from [facility]   Epic    Within the past 12 months,  you worried that your food would run out before you got money to buy more: Never true    Within the past 12 months, the food you bought just didn't last and you didn't have money to get more. : Never true  Transportation Needs: No Transportation Needs (08/16/2023)   Received from [employer]    In the past 12 months, has lack of reliable transportation kept you from medical appointments, meetings, work or from getting things needed for daily living? : No  Physical Activity: Not on file  Stress: Not on file  Social Connections: Not on file  Intimate Partner Violence: Not on file  Depression (EYV7-0): Not on file  Alcohol  Screen: Not on file  Housing: Low Risk (08/16/2023)   Received from [facility]   Epic    What is your living situation today?: I have a steady place to live    Think about the place you live. Do you have problems with any of the following? Choose all that apply:: None/None on this list  Utilities: Low Risk (08/16/2023)   Received from [facility]   Utilities    In the past 12 months has the electric, gas, oil, or water company threatened to shut off services in your home? : No  Health Literacy: Not on file    Family History  Problem Relation Age of Onset   Cancer Mother    Stroke Mother    Hypertension Mother    Memory loss Mother    Prostate cancer Father     ROS: no fevers or chills, productive cough, hemoptysis, dysphasia, odynophagia, melena, hematochezia, dysuria, hematuria, rash, seizure activity, orthopnea, PND, pedal edema, claudication. Remaining systems are negative.  Physical Exam: Well-developed well-nourished in no acute distress.  Skin is warm and dry.  HEENT is normal.  Neck is supple.  Chest is clear to auscultation with normal expansion.  Cardiovascular exam is regular rate and rhythm.  Abdominal exam nontender or distended. No masses palpated. Extremities show no edema. neuro grossly intact  ECG- personally  reviewed  A/P  1 paroxysmal atrial fibrillation-patient is holding sinus rhythm on Tikosyn .  2 history of chronic combined systolic/diastolic congestive heart failure-  3 hyperlipidemia-continue Crestor .  4 hypertension-patient's blood pressure is controlled.  Continue present medications.  5 coronary calcification-  Redell Shallow, MD    "

## 2024-05-25 ENCOUNTER — Ambulatory Visit: Admitting: Cardiology

## 2024-09-19 ENCOUNTER — Ambulatory Visit: Admitting: Cardiology
